# Patient Record
Sex: Male | Born: 1960 | Race: Black or African American | Hispanic: No | Marital: Married | Smoking: Never smoker
Health system: Southern US, Community
[De-identification: ages and names within clinical notes are randomized; demographics above are authoritative.]

## PROBLEM LIST (undated history)

## (undated) ENCOUNTER — Emergency Department (HOSPITAL_COMMUNITY): Discharge status: Absent without leave | Payer: Commercial Managed Care - PPO

## (undated) DIAGNOSIS — E119 Type 2 diabetes mellitus without complications: Secondary | ICD-10-CM

## (undated) DIAGNOSIS — I10 Essential (primary) hypertension: Secondary | ICD-10-CM

## (undated) DIAGNOSIS — E785 Hyperlipidemia, unspecified: Secondary | ICD-10-CM

## (undated) HISTORY — DX: Hyperlipidemia, unspecified: E78.5

## (undated) HISTORY — DX: Type 2 diabetes mellitus without complications: E11.9

## (undated) HISTORY — DX: Essential (primary) hypertension: I10

---

## 2015-05-07 ENCOUNTER — Other Ambulatory Visit: Payer: Self-pay | Admitting: Allergy and Immunology

## 2015-05-11 ENCOUNTER — Other Ambulatory Visit: Payer: Self-pay | Admitting: Allergy and Immunology

## 2016-03-23 LAB — HEMOGLOBIN A1C: Hemoglobin A1C: 11

## 2016-05-20 ENCOUNTER — Ambulatory Visit: Payer: Self-pay | Admitting: "Endocrinology

## 2016-05-23 ENCOUNTER — Ambulatory Visit (INDEPENDENT_AMBULATORY_CARE_PROVIDER_SITE_OTHER): Payer: BLUE CROSS/BLUE SHIELD | Admitting: "Endocrinology

## 2016-05-23 ENCOUNTER — Encounter: Payer: Self-pay | Admitting: "Endocrinology

## 2016-05-23 VITALS — BP 132/88 | HR 76 | Ht 76.0 in | Wt >= 6400 oz

## 2016-05-23 DIAGNOSIS — E7800 Pure hypercholesterolemia, unspecified: Secondary | ICD-10-CM | POA: Insufficient documentation

## 2016-05-23 DIAGNOSIS — E118 Type 2 diabetes mellitus with unspecified complications: Secondary | ICD-10-CM | POA: Diagnosis not present

## 2016-05-23 DIAGNOSIS — I10 Essential (primary) hypertension: Secondary | ICD-10-CM

## 2016-05-23 DIAGNOSIS — IMO0002 Reserved for concepts with insufficient information to code with codable children: Secondary | ICD-10-CM

## 2016-05-23 DIAGNOSIS — E1165 Type 2 diabetes mellitus with hyperglycemia: Secondary | ICD-10-CM

## 2016-05-23 MED ORDER — EMPAGLIFLOZIN 10 MG PO TABS: 10.0000 mg | ORAL_TABLET | Freq: Every day | ORAL | 3 refills | Status: DC

## 2016-05-23 NOTE — Progress Notes (Signed)
Subjective:    Patient ID: William [name], male    DOB: 05/18/1961. Patient is being seen in consultation for management of diabetes requested by  Ernestine Conrad, MD  Past Medical History:  Diagnosis Date  . Diabetes mellitus, type II (HCC)   . Hypertension    History reviewed. No pertinent surgical history. Social History   Social History  . Marital status: Married    Spouse name: N/A  . Number of children: N/A  . Years of education: N/A   Social History Main Topics  . Smoking status: Never Smoker  . Smokeless tobacco: Never Used  . Alcohol use No  . Drug use: No  . Sexual activity: Not Asked   Other Topics Concern  . None   Social History Narrative  . None   Outpatient Encounter Prescriptions as of 05/23/2016  Medication Sig  . hydrochlorothiazide (HYDRODIURIL) 25 MG tablet Take 25 mg by mouth daily.  . metFORMIN (GLUCOPHAGE-XR) 500 MG 24 hr tablet Take 500 mg by mouth 2 (two) times daily.  . rosuvastatin (CRESTOR) 20 MG tablet Take 20 mg by mouth daily.  . [DISCONTINUED] glimepiride (AMARYL) 2 MG tablet Take 2 mg by mouth daily with breakfast.  . empagliflozin (JARDIANCE) 10 MG TABS tablet Take 10 mg by mouth daily.  . [DISCONTINUED] montelukast (SINGULAIR) 10 MG tablet TAKE ONE TABLET BY MOUTH IN THE EVENING FOR COUGH OR WHEEZE   No facility-administered encounter medications on file as of 05/23/2016.    ALLERGIES: No Known Allergies VACCINATION STATUS:  There is no immunization history on file for this patient.  Diabetes  He presents for his initial diabetic visit. He has type 2 diabetes mellitus. Onset time: Patient was diagnosed at approximate age of 55 years. However, he was known to have prediabetes for several years with a reported A1c in the 8s %. His disease course has been worsening. There are no hypoglycemic associated symptoms. Pertinent negatives for hypoglycemia include no confusion, headaches, pallor or seizures. Associated symptoms include  blurred vision, polydipsia and polyuria. Pertinent negatives for diabetes include no chest pain, no fatigue, no polyphagia and no weakness. There are no hypoglycemic complications. Symptoms are worsening. There are no diabetic complications. Risk factors for coronary artery disease include diabetes mellitus, dyslipidemia, male sex and sedentary lifestyle. Current diabetic treatment includes oral agent (dual therapy) (He is on metformin 500 mg ER twice a day and glimepiride 2 mg once a day.). His weight is increasing steadily. He is following a generally unhealthy diet. When asked about meal planning, he reported none. He has not had a previous visit with a dietitian. He never participates in exercise. Home blood sugar record trend: He did not bring any meter nor logs to review today and he admits not to monitoring blood glucose regularly. An ACE inhibitor/angiotensin II receptor blocker is not being taken. Eye exam is not current.  Hyperlipidemia  This is a chronic problem. The current episode started more than 1 year ago. The problem is uncontrolled. Exacerbating diseases include diabetes and obesity. Pertinent negatives include no chest pain, myalgias or shortness of breath. Current antihyperlipidemic treatment includes statins. Risk factors for coronary artery disease include diabetes mellitus, dyslipidemia, male sex, obesity and a sedentary lifestyle.       Review of Systems  Constitutional: Negative for chills, fatigue, fever and unexpected weight change.  HENT: Negative for dental problem, mouth sores and trouble swallowing.   Eyes: Positive for blurred vision. Negative for visual disturbance.  Respiratory: Negative for  cough, choking, chest tightness, shortness of breath and wheezing.   Cardiovascular: Negative for chest pain, palpitations and leg swelling.  Gastrointestinal: Negative for abdominal distention, abdominal pain, constipation, diarrhea, nausea and vomiting.  Endocrine: Positive for  polydipsia and polyuria. Negative for polyphagia.  Genitourinary: Negative for dysuria, flank pain, hematuria and urgency.  Musculoskeletal: Negative for back pain, gait problem, myalgias and neck pain.  Skin: Negative for pallor, rash and wound.  Neurological: Negative for seizures, syncope, weakness, numbness and headaches.  Psychiatric/Behavioral: Negative.  Negative for confusion and dysphoric mood.    Objective:    BP 132/88   Pulse 76   Ht 6\' 4"  (1.93 m)   Wt (!) 481 lb (218.2 kg)   BMI 58.55 kg/m   Wt Readings from Last 3 Encounters:  05/23/16 (!) 481 lb (218.2 kg)    Physical Exam  Constitutional: He is oriented to person, place, and time. He appears well-developed. He is cooperative. No distress.  Morbidly obese young man.  HENT:  Head: Normocephalic and atraumatic.  Eyes: EOM are normal.  Neck: Normal range of motion. Neck supple. No tracheal deviation present. No thyromegaly present.  Cardiovascular: Normal rate, S1 normal, S2 normal and normal heart sounds.  Exam reveals no gallop.   No murmur heard. Pulses:      Dorsalis pedis pulses are 1+ on the right side, and 1+ on the left side.       Posterior tibial pulses are 1+ on the right side, and 1+ on the left side.  Pulmonary/Chest: Breath sounds normal. No respiratory distress. He has no wheezes.  Abdominal: Soft. Bowel sounds are normal. He exhibits no distension. There is no tenderness. There is no guarding and no CVA tenderness.  Musculoskeletal: He exhibits no edema.       Right shoulder: He exhibits no swelling and no deformity.  Neurological: He is alert and oriented to person, place, and time. He has normal strength and normal reflexes. No cranial nerve deficit or sensory deficit. Gait normal.  Skin: Skin is warm and dry. No rash noted. No cyanosis. Nails show no clubbing.  Psychiatric: He has a normal mood and affect. His speech is normal. Cognition and memory are normal.    During his August physical his  A1c was reported to be greater than 11%.     Assessment & Plan:   1. Uncontrolled type 2 diabetes mellitus with complication, without long-term current use of insulin (HCC)    - Patient has currently uncontrolled symptomatic type 2 DM since  55 years of age,  with most recent A1c of 11 %. Recent labs reviewed.   his diabetes is complicated by morbid obesity and sedentary lifestyle and patient remains at a high risk for more acute and chronic complications of diabetes which include CAD, CVA, CKD, retinopathy, and neuropathy. These are all discussed in detail with the patient.  - I have counseled the patient on diet management and weight loss, by adopting a carbohydrate restricted/protein rich diet.  - Suggestion is made for patient to avoid simple carbohydrates   from their diet including Cakes , Desserts, Ice Cream,  Soda (  diet and regular) , Sweet Tea , Candies,  Chips, Cookies, Artificial Sweeteners,   and "Sugar-free" Products . This will help patient to have stable blood glucose profile and potentially avoid unintended weight gain.  - I encouraged the patient to switch to  unprocessed or minimally processed complex starch and increased protein intake (animal or plant source), fruits, and  vegetables.  - Patient is advised to stick to a routine mealtimes to eat 3 meals  a day and avoid unnecessary snacks ( to snack only to correct hypoglycemia).  - The patient will be scheduled with Norm Salt, RDN, CDE for individualized DM education.  - I have approached patient with the following individualized plan to manage diabetes and patient agrees:   - Given his extremely high A1c of greater than 11% which is likely associated with severe glycemic burden, he will require insulin treatment. - However, it is essential to assure his commitment for proper monitoring of blood glucose for optimal use of insulin.  I  will proceed to initiate strict monitoring of glucose  AC and HS.  -Patient  is encouraged to call clinic for blood glucose levels less than 70 or above 300 mg /dl. - I will continue metformin 500 mg ER twice a day, therapeutically suitable for patient. - I will add low-dose Jardiance 10 mg by mouth every morning. Side effects and precautions discussed with him.  - I will discontinue glimepiride, risk outweighs benefit for this patient.  - Patient will be considered for incretin therapy as appropriate next visit. - Patient specific target  A1c;  LDL, HDL, Triglycerides, and  Waist Circumference were discussed in detail.  2) BP/HTN: Controlled. Continue current medications. 3) Lipids/HPL:  Control unknown,  continue statins. 4)  Weight/Diet: CDE Consult will be initiated , exercise, and detailed carbohydrates information provided.  5) Chronic Care/Health Maintenance:  -Patient is on  Statin medications and encouraged to continue to follow up with Ophthalmology, Podiatrist at least yearly or according to recommendations, and advised to   stay away from smoking. I have recommended yearly flu vaccine and pneumonia vaccination at least every 5 years; moderate intensity exercise for up to 150 minutes weekly; and  sleep for at least 7 hours a day.  - 60 minutes of time was spent on the care of this patient , 50% of which was applied for counseling on diabetes complications and their preventions.  - Patient to bring meter and  blood glucose logs during their next visit.   - I advised patient to maintain close follow up with Ernestine Conrad, MD for primary care needs.  Follow up plan: - Return in about 1 week (around 05/30/2016) for follow up with meter and logs- no labs.  Marquis Lunch, MD Phone: [phone]  Fax: [phone]   05/23/2016, 5:04 PM

## 2016-05-23 NOTE — Patient Instructions (Signed)

## 2016-05-31 ENCOUNTER — Ambulatory Visit: Payer: BLUE CROSS/BLUE SHIELD | Admitting: "Endocrinology

## 2019-03-22 ENCOUNTER — Emergency Department (HOSPITAL_COMMUNITY): Payer: Commercial Managed Care - PPO

## 2019-03-22 ENCOUNTER — Encounter (HOSPITAL_COMMUNITY): Payer: Self-pay

## 2019-03-22 ENCOUNTER — Other Ambulatory Visit: Payer: Self-pay

## 2019-03-22 ENCOUNTER — Emergency Department (HOSPITAL_COMMUNITY)
Admission: EM | Admit: 2019-03-22 | Discharge: 2019-03-22 | Disposition: A | Discharge status: Home / Self Care | Payer: Commercial Managed Care - PPO | Attending: Emergency Medicine | Admitting: Emergency Medicine

## 2019-03-22 DIAGNOSIS — Z7984 Long term (current) use of oral hypoglycemic drugs: Secondary | ICD-10-CM | POA: Diagnosis not present

## 2019-03-22 DIAGNOSIS — E119 Type 2 diabetes mellitus without complications: Secondary | ICD-10-CM | POA: Diagnosis not present

## 2019-03-22 DIAGNOSIS — R0989 Other specified symptoms and signs involving the circulatory and respiratory systems: Secondary | ICD-10-CM | POA: Diagnosis not present

## 2019-03-22 DIAGNOSIS — I10 Essential (primary) hypertension: Secondary | ICD-10-CM | POA: Diagnosis not present

## 2019-03-22 DIAGNOSIS — Z79899 Other long term (current) drug therapy: Secondary | ICD-10-CM | POA: Insufficient documentation

## 2019-03-22 NOTE — Discharge Instructions (Signed)
Your testing today shows a totally normal x-ray without any signs of blockages or masses or swelling or cancers.  I would recommend that you follow-up with the neurologist for a possible sleep study.  Seek medical exam in the emergency department for any severe or worsening symptoms.  Try to sleep on your side to avoid this feeling while you are sleeping

## 2019-03-22 NOTE — ED Triage Notes (Signed)
Pt woke up this morning and felt like his throat was closing. Has been getting progressively worse over the last few weeks. States it may be attributed to allergies.

## 2019-03-22 NOTE — ED Provider Notes (Signed)
[facility] EMERGENCY DEPARTMENT Provider Note   CSN: [id number] Arrival date & time: 03/22/19  9983    History   Chief Complaint Chief Complaint  Patient presents with  . Throat Closing    HPI William [name] is a 58 y.o. male.     HPI  58 year old male with a history of diabetes and hypertension, presents to the hospital with a complaint of feeling like his throat is closing off.  This is been going on for an undefined amount of time but seems to be between 1 and 2 weeks, possibly longer but seems to be getting worse.  He reports that he almost always notices this when he sleeps on his back, he does not notice it when he sleeps on his side.  His significant other is here with him and states that she noticed him snoring heavily when he lays on his back but not on his side.  He reports that he wakes up feeling severely anxious that he cannot breathe and feeling like his throat is closing off, he does not feel this when he is in the upright position, he does not have exertional symptoms and denies any chest pain coughing fever shortness of breath or even a sore throat.  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  Past Medical History:  Diagnosis Date  . Diabetes mellitus, type II ([place])   . Hypertension     Patient Active Problem List   Diagnosis Date Noted  . Uncontrolled type 2 diabetes mellitus with complication, without long-term current use of insulin ([place]) 05/23/2016  . Hypercholesteremia 05/23/2016  . Morbid obesity ([place]) 05/23/2016  . Essential hypertension, benign 05/23/2016    History reviewed. No pertinent surgical history.      Home Medications    Prior to Admission medications   Medication Sig Start Date End Date Taking? Authorizing Provider  empagliflozin (JARDIANCE) 10 MG TABS tablet Take 10 mg by mouth daily. 05/23/16   Cassandria Anger, MD  hydrochlorothiazide  (HYDRODIURIL) 25 MG tablet Take 25 mg by mouth daily.    [provider]  metFORMIN (GLUCOPHAGE-XR) 500 MG 24 hr tablet Take 500 mg by mouth 2 (two) times daily.    [provider]  rosuvastatin (CRESTOR) 20 MG tablet Take 20 mg by mouth daily.    [provider]    Family History Family History  Problem Relation Age of Onset  . Thyroid disease Mother   . Hypertension Mother   . Diabetes Father   . Hypertension Father   . Hypertension Sister   . Hypertension Brother     Social History Social History   Tobacco Use  . Smoking status: Never Smoker  . Smokeless tobacco: Never Used  Substance Use Topics  . Alcohol use: No  . Drug use: No     Allergies   Patient has no known allergies.   Review of Systems Review of Systems  All other systems reviewed and are negative.    Physical Exam Updated Vital Signs BP (!) 147/95 (BP Location: Right Arm)   Pulse 86   Temp 97.7 F (36.5 C) (Oral)   Resp 12   Ht 1.93 m (6\' 4" )   Wt (!) 190.5 kg   SpO2 98%   BMI 51.12 kg/m   Physical Exam Vitals signs and nursing note reviewed.  Constitutional:      General: He is not in  acute distress.    Appearance: He is well-developed.  HENT:     Head: Normocephalic and atraumatic.     Comments: The pharynx is open, tonsils are normal, uvula is midline and of normal size and appearance, dentition is normal, phonation is normal, no trismus or torticollis    Mouth/Throat:     Pharynx: No oropharyngeal exudate.  Eyes:     General: No scleral icterus.       Right eye: No discharge.        Left eye: No discharge.     Conjunctiva/sclera: Conjunctivae normal.     Pupils: Pupils are equal, round, and reactive to light.  Neck:     Musculoskeletal: Normal range of motion and neck supple.     Thyroid: No thyromegaly.     Vascular: No JVD.  Cardiovascular:     Rate and Rhythm: Normal rate and regular rhythm.     Heart sounds: Normal heart sounds. No murmur. No  friction rub. No gallop.   Pulmonary:     Effort: Pulmonary effort is normal. No respiratory distress.     Breath sounds: Normal breath sounds. No wheezing or rales.  Abdominal:     General: Bowel sounds are normal. There is no distension.     Palpations: Abdomen is soft. There is no mass.     Tenderness: There is no abdominal tenderness.  Musculoskeletal: Normal range of motion.        General: No tenderness.  Lymphadenopathy:     Cervical: No cervical adenopathy.  Skin:    General: Skin is warm and dry.     Findings: No erythema or rash.  Neurological:     Mental Status: He is alert.     Coordination: Coordination normal.  Psychiatric:        Behavior: Behavior normal.      ED Treatments / Results  Labs (all labs ordered are listed, but only abnormal results are displayed) Labs Reviewed - No data to display  EKG None  Radiology Dg Neck Soft Tissue  Result Date: 03/22/2019 CLINICAL DATA:  Throat swelling EXAM: NECK SOFT TISSUES - 1+ VIEW COMPARISON:  None. FINDINGS: There is no evidence of retropharyngeal soft tissue swelling or epiglottic enlargement. The cervical airway is unremarkable and no radio-opaque foreign body identified. IMPRESSION: Negative. Electronically Signed   By: Charlett NoseKevin  Dover M.D.   On: 03/22/2019 08:22    Procedures Procedures (including critical care time)  Medications Ordered in ED Medications - No data to display   Initial Impression / Assessment and Plan / ED Course  I have reviewed the triage vital signs and the nursing notes.  Pertinent labs & imaging results that were available during my care of the patient were reviewed by me and considered in my medical decision making (see chart for details).  Clinical Course as of Mar 22 831  Mon Mar 22, 2019  0830 I have personally looked at the x-ray of the soft tissues of the neck and find there to be no signs of masses or obstructions.  This appears to be a normal x-ray.  I will refer the patient  to neurology   [BM]    Clinical Course User Index [BM] Eber HongMiller, Taralee Marcus, MD       The patient has a supple neck without any objective findings of swelling or anatomical obstruction.  I suspect there is some degree of sleep apnea.  He may have some type of laryngeal mass though this seems less likely.  X-ray has been ordered.  The patient can follow-up in the outpatient setting likely for a sleep study, he is agreeable.  Final Clinical Impressions(s) / ED Diagnoses   Final diagnoses:  Globus pharyngeus    ED Discharge Orders    None       Eber HongMiller, Rasa Degrazia, MD 03/22/19 [phone]

## 2019-04-22 ENCOUNTER — Other Ambulatory Visit (HOSPITAL_BASED_OUTPATIENT_CLINIC_OR_DEPARTMENT_OTHER): Payer: Self-pay

## 2019-04-22 DIAGNOSIS — G2581 Restless legs syndrome: Secondary | ICD-10-CM

## 2019-04-22 DIAGNOSIS — G4733 Obstructive sleep apnea (adult) (pediatric): Secondary | ICD-10-CM

## 2019-04-26 ENCOUNTER — Other Ambulatory Visit (HOSPITAL_COMMUNITY)
Admission: RE | Admit: 2019-04-26 | Discharge: 2019-04-26 | Disposition: A | Discharge status: Home / Self Care | Payer: Commercial Managed Care - PPO | Source: Ambulatory Visit | Attending: Neurology | Admitting: Neurology

## 2019-04-26 DIAGNOSIS — Z20828 Contact with and (suspected) exposure to other viral communicable diseases: Secondary | ICD-10-CM | POA: Insufficient documentation

## 2019-04-26 DIAGNOSIS — Z01812 Encounter for preprocedural laboratory examination: Secondary | ICD-10-CM | POA: Insufficient documentation

## 2019-04-26 LAB — SARS CORONAVIRUS 2 (TAT 6-24 HRS): SARS Coronavirus 2: NEGATIVE

## 2019-04-28 ENCOUNTER — Ambulatory Visit: Payer: Commercial Managed Care - PPO | Attending: Neurology | Admitting: Neurology

## 2019-04-28 ENCOUNTER — Other Ambulatory Visit: Payer: Self-pay

## 2019-04-28 DIAGNOSIS — G2581 Restless legs syndrome: Secondary | ICD-10-CM

## 2019-04-28 DIAGNOSIS — Z79899 Other long term (current) drug therapy: Secondary | ICD-10-CM | POA: Diagnosis not present

## 2019-04-28 DIAGNOSIS — G4733 Obstructive sleep apnea (adult) (pediatric): Secondary | ICD-10-CM | POA: Insufficient documentation

## 2019-04-28 DIAGNOSIS — Z7984 Long term (current) use of oral hypoglycemic drugs: Secondary | ICD-10-CM | POA: Diagnosis not present

## 2019-05-01 NOTE — Procedures (Signed)
   Little [place]ning A. Merlene Laughter, MD     [email or web address]             NOCTURNAL POLYSOMNOGRAPHY   LOCATION: [facility]  Patient Name: William, [name] Date: 04/28/2019 Gender: Male D.O.B: 10-13-1960 Age (years): 22 Referring Provider: Barton Fanny NP Height (inches): 76 Interpreting Physician: Phillips Odor MD, ABSM Weight (lbs): 420 RPSGT: Peak, Robert BMI: 51 MRN: [id number] Neck Size: 17.00 CLINICAL INFORMATION The patient is referred for a split night study with BPAP.  MEDICATIONS Medications self-administered by patient taken the night of the study : N/A  Current Outpatient Medications:  .  empagliflozin (JARDIANCE) 10 MG TABS tablet, Take 10 mg by mouth daily., Disp: 30 tablet, Rfl: 3 .  hydrochlorothiazide (HYDRODIURIL) 25 MG tablet, Take 25 mg by mouth daily., Disp: , Rfl:  .  metFORMIN (GLUCOPHAGE-XR) 500 MG 24 hr tablet, Take 500 mg by mouth 2 (two) times daily., Disp: , Rfl:  .  rosuvastatin (CRESTOR) 20 MG tablet, Take 20 mg by mouth daily., Disp: , Rfl:    SLEEP STUDY TECHNIQUE As per the AASM Manual for the Scoring of Sleep and Associated Events v2.3 (April 2016) with a hypopnea requiring 4% desaturations.  The channels recorded and monitored were frontal, central and occipital EEG, electrooculogram (EOG), submentalis EMG (chin), nasal and oral airflow, thoracic and abdominal wall motion, anterior tibialis EMG, snore microphone, electrocardiogram, and pulse oximetry. Bi-level positive airway pressure (BiPAP) was initiated when the patient met split night criteria and was titrated according to treat sleep-disordered breathing.  RESPIRATORY PARAMETERS Diagnostic  Total AHI (/hr): 32.9 RDI (/hr): 35.9 OA Index (/hr): - CA Index (/hr): 0.0 REM AHI (/hr): 67.2 NREM AHI (/hr): 28.9 Supine AHI (/hr): N/A Non-supine AHI (/hr): 35.85 Min O2 Sat (%): 80.0 Mean O2 (%): 94.4 Time below 88% (min): 8.5     Titration The patient initially was  tried on CPAP but had difficulties tolerating this.  The patient was subsequently placed on bilevel pressures. Optimal IPAP Pressure (cm): 15 Optimal EPAP Pressure (cm): 11 AHI at Optimal Pressure (/hr): 0.0 Min O2 at Optimal Pressure (%): 96.0 Sleep % at Optimal (%): 43 Supine % at Optimal (%): 0      SLEEP ARCHITECTURE The study was initiated at 10:00:12 PM and terminated at 4:05:39 AM. The total recorded time was 365.4 minutes. EEG confirmed total sleep time was 162.5 minutes yielding a sleep efficiency of 44.5%%. Sleep onset after lights out was 8.3 minutes with a REM latency of 149.0 minutes. The patient spent 12.3%% of the night in stage N1 sleep, 76.0%% in stage N2 sleep, 0.0%% in stage N3 and 11.7% in REM. Wake after sleep onset (WASO) was 194.7 minutes. The Arousal Index was 11.8/hour.  LEG MOVEMENT DATA The total Periodic Limb Movements of Sleep (PLMS) were 0. The PLMS index was 0.0 .  CARDIAC DATA The 2 lead EKG demonstrated sinus rhythm. The mean heart rate was 100.0 beats per minute. Other EKG findings include: None.  IMPRESSIONS 1. Severe obstructive sleep apnea occurred during the diagnostic portion of the study (AHI = 32.9 /hour). The optimal BIPAP selected for this patient are ( 15 / cm of water)   Delano Metz, MD Diplomate, American Board of Sleep Medicine.  ELECTRONICALLY SIGNED ON:  05/01/2019, 10:42 PM [place] PH: [phone]   FX: [phone] [place]

## 2019-06-07 ENCOUNTER — Ambulatory Visit (INDEPENDENT_AMBULATORY_CARE_PROVIDER_SITE_OTHER): Payer: Commercial Managed Care - PPO | Admitting: Otolaryngology

## 2019-08-06 HISTORY — PX: LEG SURGERY: SHX1003

## 2020-12-16 IMAGING — DX NECK SOFT TISSUES - 1+ VIEW
3 series · 3 of 3 positions shown · non-contrast
Comparison: None.

CLINICAL DATA: Throat swelling

EXAM:
NECK SOFT TISSUES - 1+ VIEW

[neck lat (1 of 2)]
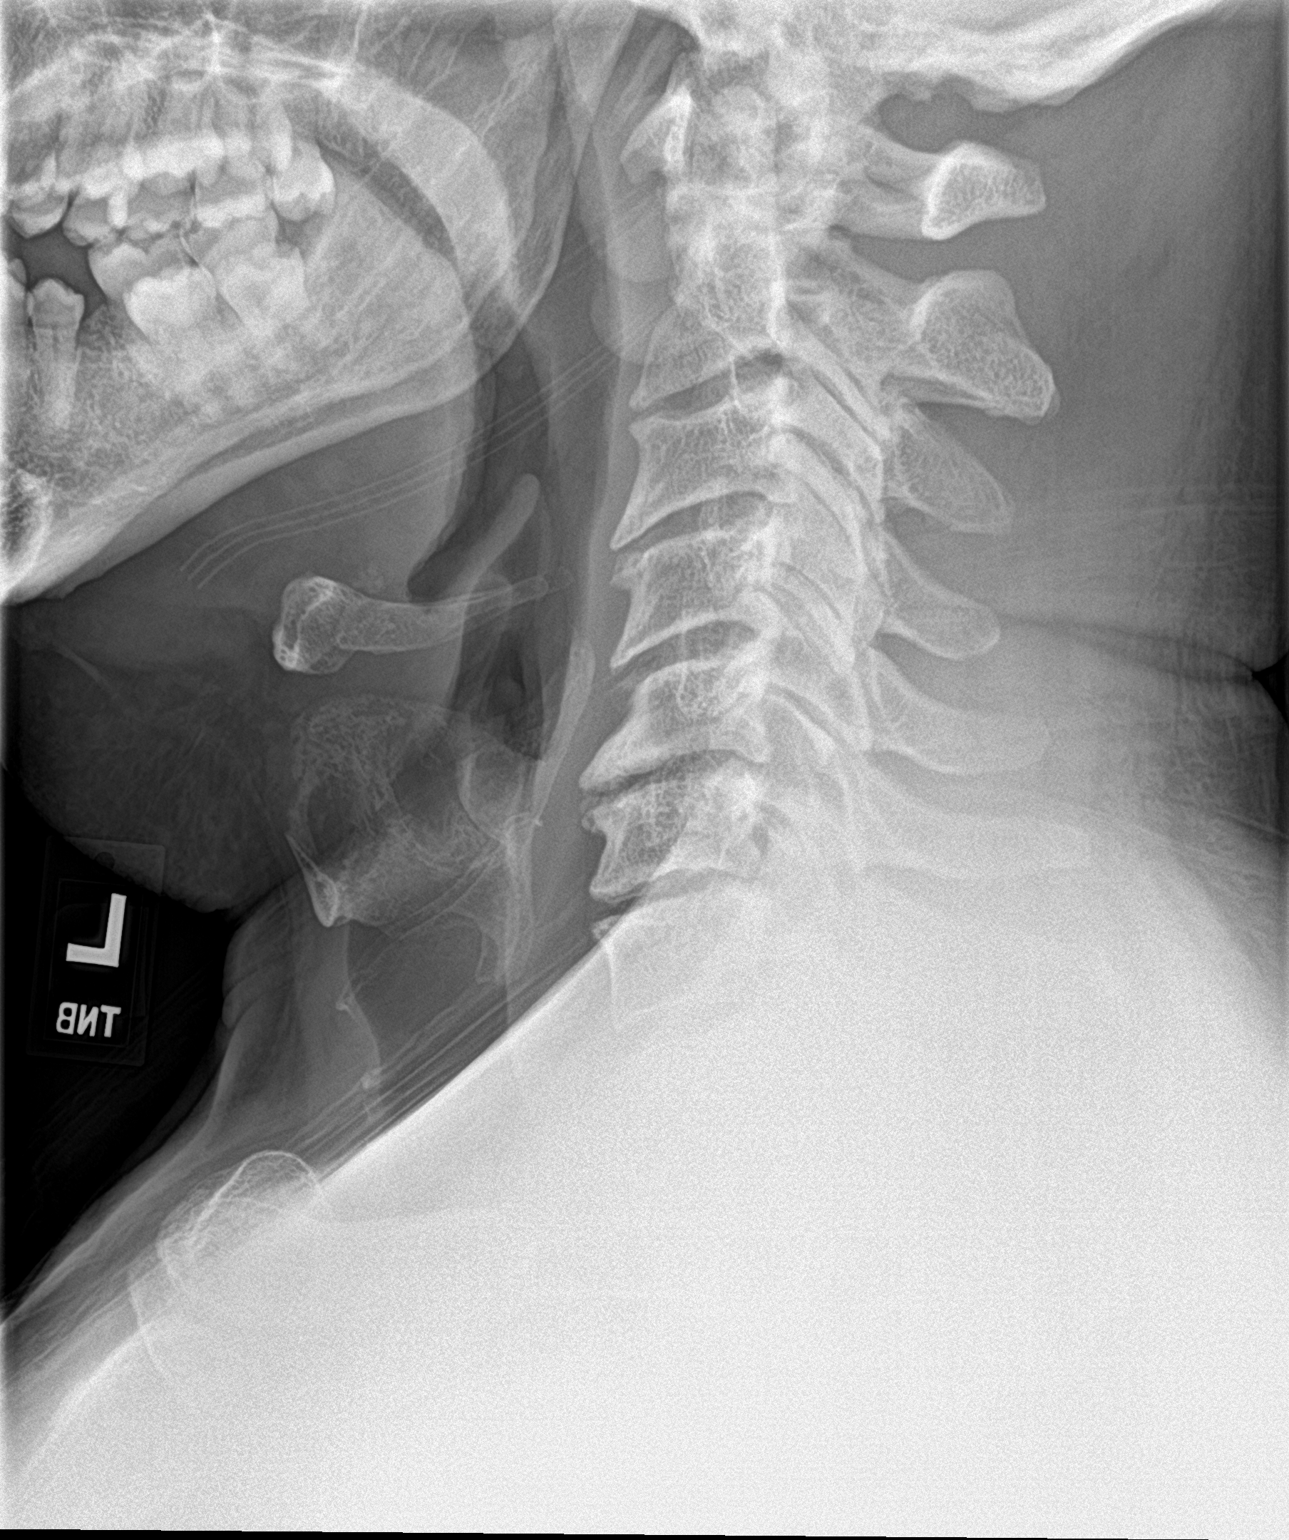

[neck ap]
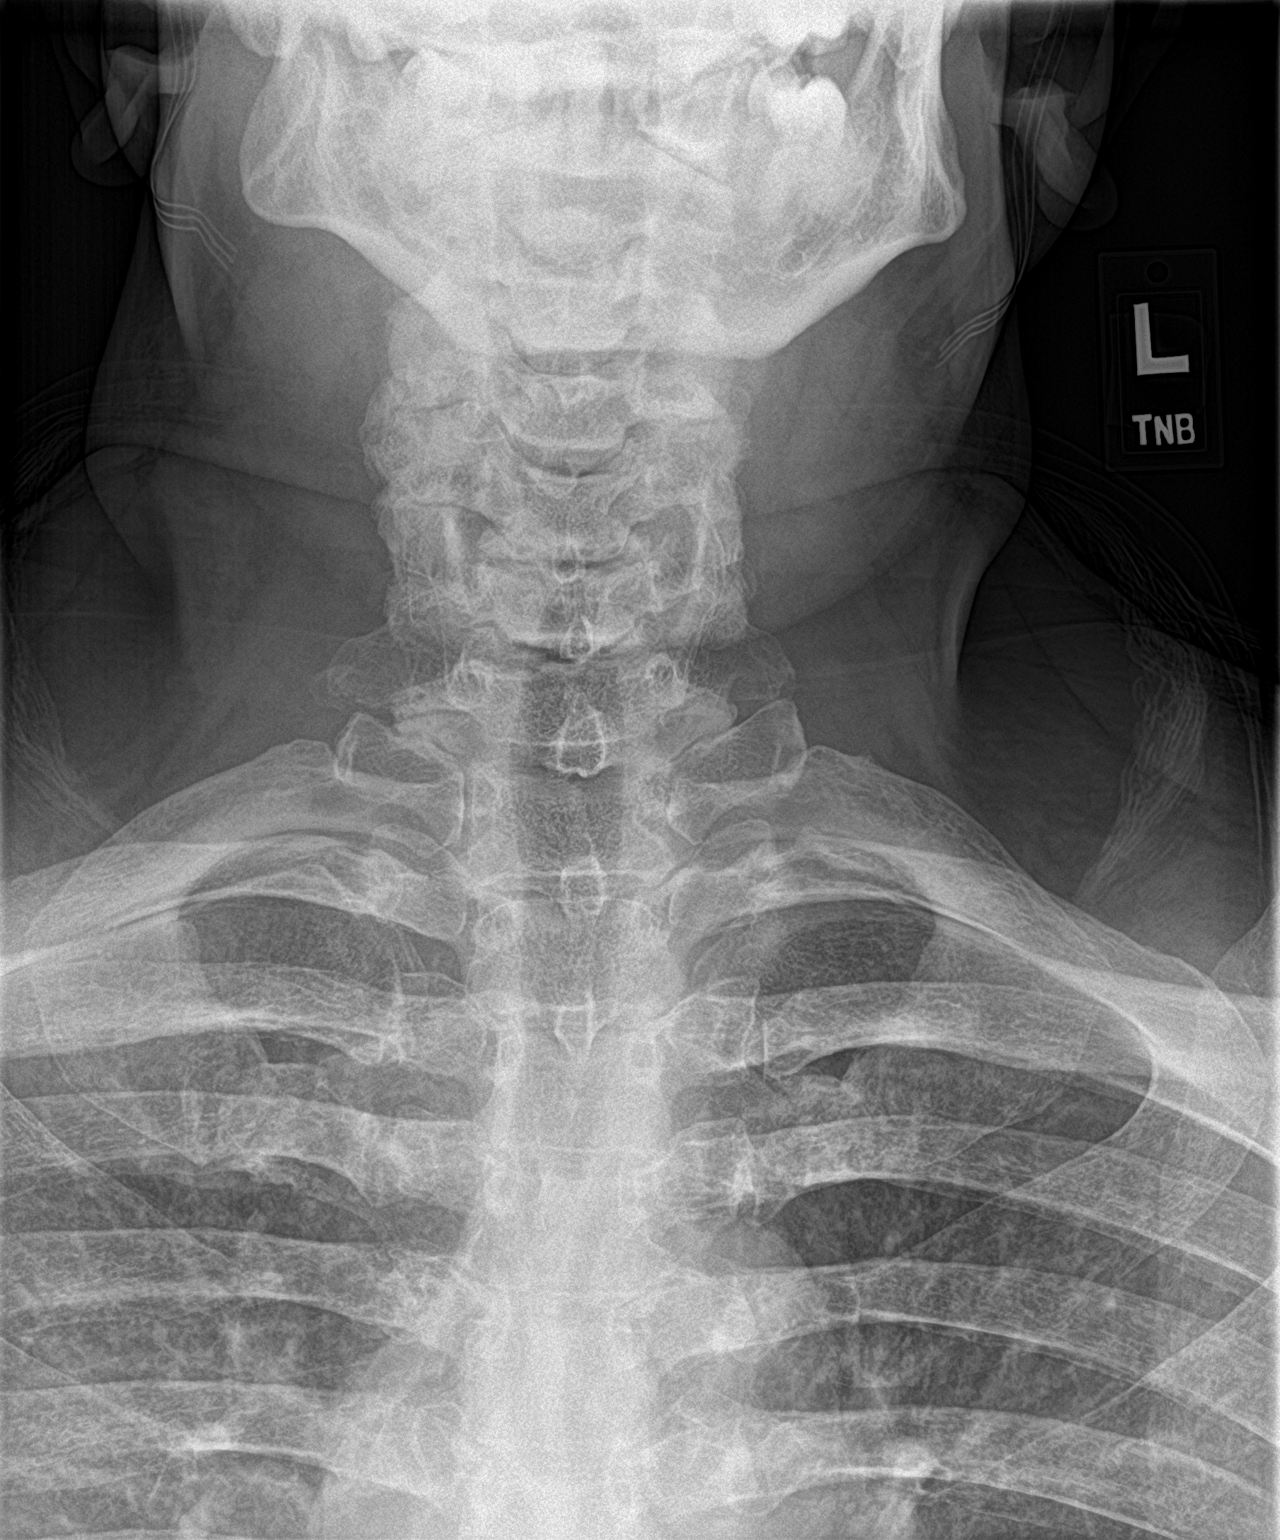

[neck lat (2 of 2)]
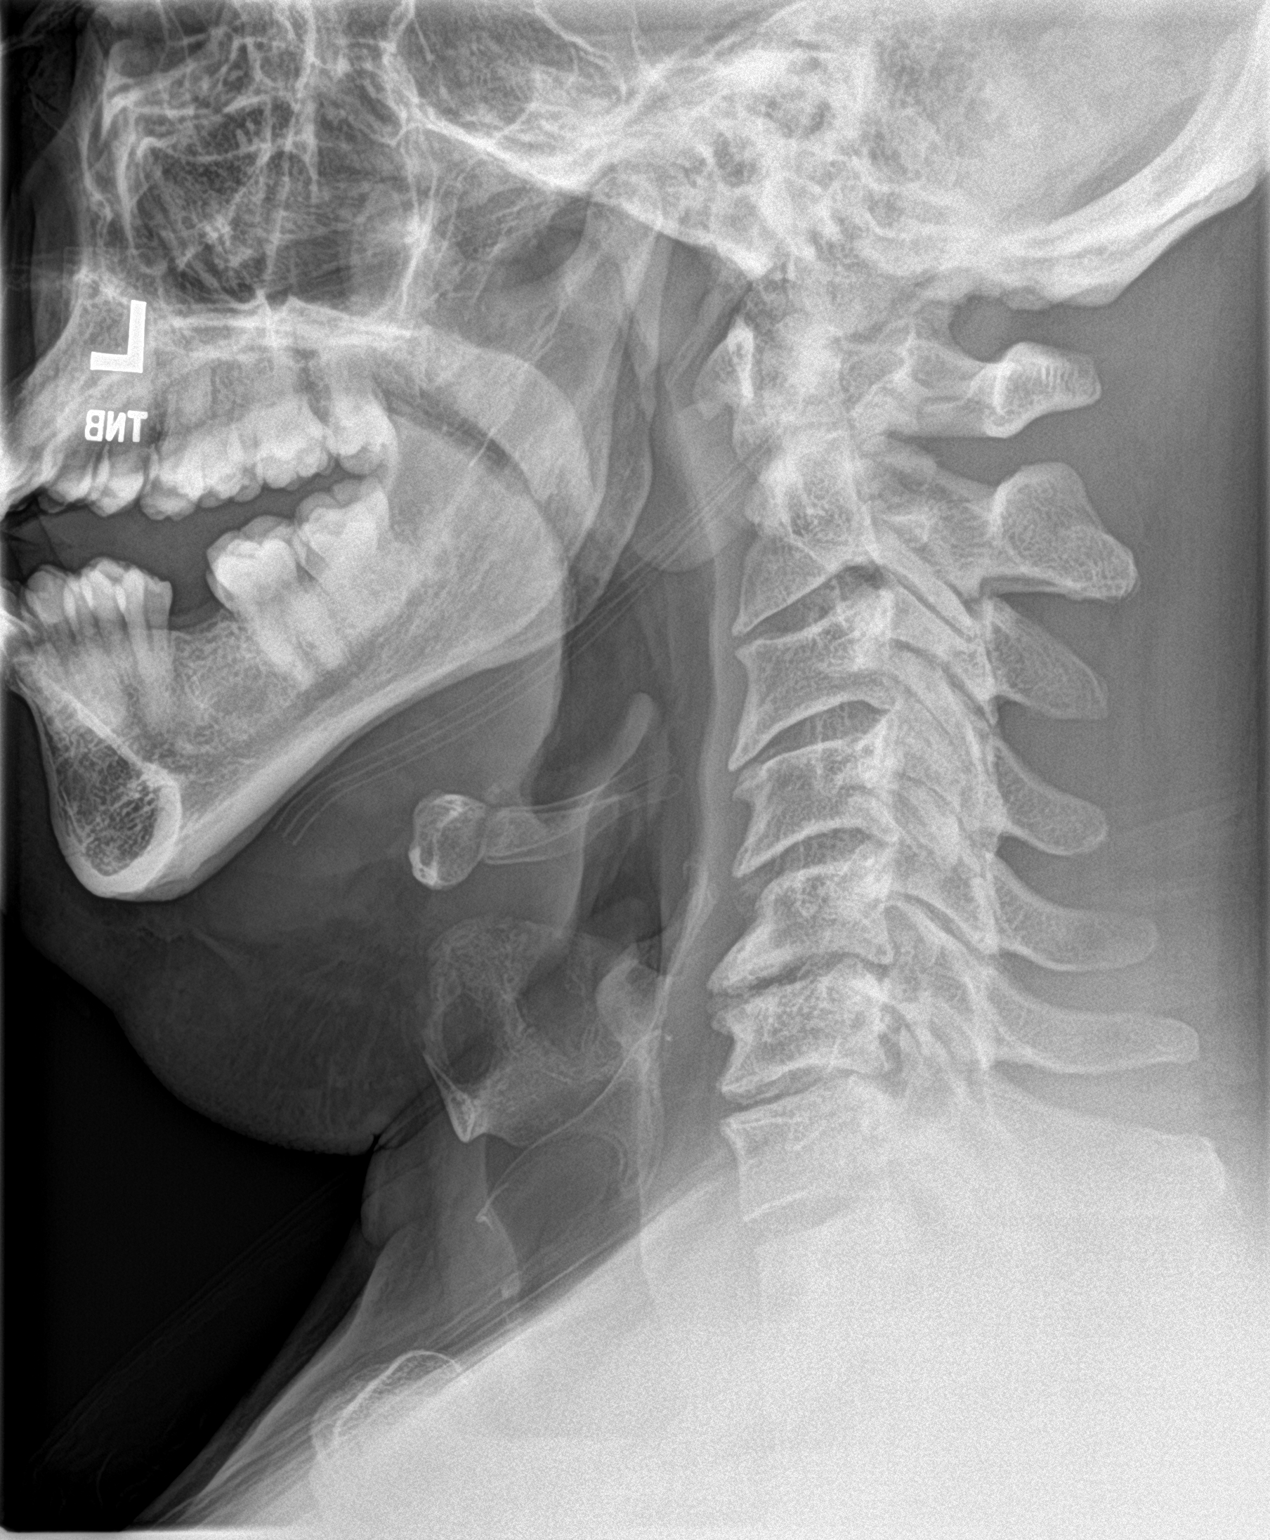

[3 of 3 positions shown; findings below may reference images not displayed]

FINDINGS: There is no evidence of retropharyngeal soft tissue swelling or
epiglottic enlargement. The cervical airway is unremarkable and no
radio-opaque foreign body identified.
IMPRESSION: Negative.

## 2021-07-20 ENCOUNTER — Encounter: Payer: Self-pay | Admitting: Family Medicine

## 2021-07-20 ENCOUNTER — Ambulatory Visit (INDEPENDENT_AMBULATORY_CARE_PROVIDER_SITE_OTHER): Payer: Commercial Managed Care - PPO | Admitting: Family Medicine

## 2021-07-20 DIAGNOSIS — Z7689 Persons encountering health services in other specified circumstances: Secondary | ICD-10-CM

## 2021-07-20 DIAGNOSIS — R030 Elevated blood-pressure reading, without diagnosis of hypertension: Secondary | ICD-10-CM

## 2021-07-20 DIAGNOSIS — E7800 Pure hypercholesterolemia, unspecified: Secondary | ICD-10-CM

## 2021-07-20 LAB — BAYER DCA HB A1C WAIVED: HB A1C (BAYER DCA - WAIVED): 7.2 % — ABNORMAL HIGH (ref 4.8–5.6)

## 2021-07-20 NOTE — Progress Notes (Signed)
New Patient Office Visit  Assessment & Plan:  1. Morbid obesity ([place]) - CBC with Differential/Platelet - CMP14+EGFR - Lipid panel - TSH - Bayer DCA Hb A1c Waived  2. Hypercholesteremia - Lipid panel  3. Elevated BP without diagnosis of hypertension Controlled at home per patient.  4. Encounter to establish care   Follow-up: Return as directed after labs result.   Hendricks Limes, MSN, APRN, FNP-C [facility]  Subjective:  Patient ID: William [name], male    DOB: [birth year]  Age: 60 y.o. MRN: [id number]  Patient Care Team: Loman Brooklyn, FNP as PCP - General (Family Medicine)  CC:  Chief Complaint  Patient presents with   New Patient (Initial Visit)    Dr.Bluth   Establish Care    HPI William [name] presents to establish care.   Patient is accompanied by his wife, who he is okay with being present.   Blood pressure is elevated today. Patient reports readings at home are around 128/80. He reports being 520 lbs before he had his leg surgery.    Review of Systems  Constitutional:  Negative for chills, fever, malaise/fatigue and weight loss.  HENT:  Negative for congestion, ear discharge, ear pain, nosebleeds, sinus pain, sore throat and tinnitus.   Eyes:  Negative for blurred vision, double vision, pain, discharge and redness.  Respiratory:  Negative for cough, shortness of breath and wheezing.   Cardiovascular:  Negative for chest pain, palpitations and leg swelling.  Gastrointestinal:  Negative for abdominal pain, constipation, diarrhea, heartburn, nausea and vomiting.  Genitourinary:  Negative for dysuria, frequency and urgency.  Musculoskeletal:  Negative for myalgias.  Skin:  Negative for rash.  Neurological:  Negative for dizziness, seizures, weakness and headaches.  Psychiatric/Behavioral:  Negative for depression, substance abuse and suicidal ideas. The patient is not nervous/anxious.   No current outpatient medications on  file.  No Known Allergies  Past Medical History:  Diagnosis Date   Diabetes mellitus, type II ([place])    Hypertension     Past Surgical History:  Procedure Laterality Date   LEG SURGERY Left 2021    Family History  Problem Relation Age of Onset   Thyroid disease Mother    Hypertension Mother    Diabetes Father    Hypertension Father    Hypertension Brother     Social History   Socioeconomic History   Marital status: Married    Spouse name: Not on file   Number of children: Not on file   Years of education: Not on file   Highest education level: Not on file  Occupational History   Not on file  Tobacco Use   Smoking status: Never   Smokeless tobacco: Never  Vaping Use   Vaping Use: Never used  Substance and Sexual Activity   Alcohol use: No   Drug use: No   Sexual activity: Not on file  Other Topics Concern   Not on file  Social History Narrative   Not on file   Social Determinants of Health   Financial Resource Strain: Not on file  Food Insecurity: Not on file  Transportation Needs: Not on file  Physical Activity: Not on file  Stress: Not on file  Social Connections: Not on file  Intimate Partner Violence: Not on file    Objective:   Today's Vitals: BP (!) 139/91    Pulse 84    Temp (!) 97.4 F (36.3 C) (Temporal)    Ht _0  (1.93 m)  Wt (!) 317 lb 6.4 oz (144 kg)    SpO2 98%    BMI 38.64 kg/m   Physical Exam Vitals reviewed.  Constitutional:      General: He is not in acute distress.    Appearance: Normal appearance. He is obese. He is not ill-appearing, toxic-appearing or diaphoretic.  HENT:     Head: Normocephalic and atraumatic.  Eyes:     General: No scleral icterus.       Right eye: No discharge.        Left eye: No discharge.     Conjunctiva/sclera: Conjunctivae normal.  Cardiovascular:     Rate and Rhythm: Normal rate and regular rhythm.     Heart sounds: Normal heart sounds. No murmur heard.   No friction rub. No gallop.   Pulmonary:     Effort: Pulmonary effort is normal. No respiratory distress.     Breath sounds: Normal breath sounds. No stridor. No wheezing, rhonchi or rales.  Musculoskeletal:        General: Normal range of motion.     Cervical back: Normal range of motion.  Skin:    General: Skin is warm and dry.  Neurological:     Mental Status: He is alert and oriented to person, place, and time. Mental status is at baseline.  Psychiatric:        Mood and Affect: Mood normal.        Behavior: Behavior normal.        Thought Content: Thought content normal.        Judgment: Judgment normal.

## 2021-07-21 LAB — CMP14+EGFR
ALT: 18 IU/L (ref 0–44)
AST: 18 IU/L (ref 0–40)
Albumin/Globulin Ratio: 2 (ref 1.2–2.2)
Albumin: 4.7 g/dL (ref 3.8–4.9)
Alkaline Phosphatase: 104 IU/L (ref 44–121)
BUN/Creatinine Ratio: 14 (ref 9–20)
BUN: 15 mg/dL (ref 6–24)
Bilirubin Total: 0.7 mg/dL (ref 0.0–1.2)
CO2: 21 mmol/L (ref 20–29)
Calcium: 9.7 mg/dL (ref 8.7–10.2)
Chloride: 104 mmol/L (ref 96–106)
Creatinine, Ser: 1.06 mg/dL (ref 0.76–1.27)
Globulin, Total: 2.3 g/dL (ref 1.5–4.5)
Glucose: 149 mg/dL — ABNORMAL HIGH (ref 70–99)
Potassium: 4 mmol/L (ref 3.5–5.2)
Sodium: 141 mmol/L (ref 134–144)
Total Protein: 7 g/dL (ref 6.0–8.5)
eGFR: 81 mL/min/{1.73_m2} (ref >59)

## 2021-07-21 LAB — LIPID PANEL
Chol/HDL Ratio: 4.4 ratio (ref 0.0–5.0)
Cholesterol, Total: 211 mg/dL — ABNORMAL HIGH (ref 100–199)
HDL: 48 mg/dL (ref >39)
LDL Chol Calc (NIH): 153 mg/dL — ABNORMAL HIGH (ref 0–99)
Triglycerides: 59 mg/dL (ref 0–149)
VLDL Cholesterol Cal: 10 mg/dL (ref 5–40)

## 2021-07-21 LAB — CBC WITH DIFFERENTIAL/PLATELET
Basophils Absolute: 0 10*3/uL (ref 0.0–0.2)
Basos: 1 %
EOS (ABSOLUTE): 0.1 10*3/uL (ref 0.0–0.4)
Eos: 1 %
Hematocrit: 47.9 % (ref 37.5–51.0)
Hemoglobin: 15.7 g/dL (ref 13.0–17.7)
Immature Grans (Abs): 0 10*3/uL (ref 0.0–0.1)
Immature Granulocytes: 0 %
Lymphocytes Absolute: 2.4 10*3/uL (ref 0.7–3.1)
Lymphs: 35 %
MCH: 28.4 pg (ref 26.6–33.0)
MCHC: 32.8 g/dL (ref 31.5–35.7)
MCV: 87 fL (ref 79–97)
Monocytes Absolute: 0.5 10*3/uL (ref 0.1–0.9)
Monocytes: 7 %
Neutrophils Absolute: 3.7 10*3/uL (ref 1.4–7.0)
Neutrophils: 56 %
Platelets: 236 10*3/uL (ref 150–450)
RBC: 5.53 x10E6/uL (ref 4.14–5.80)
RDW: 13.6 % (ref 11.6–15.4)
WBC: 6.7 10*3/uL (ref 3.4–10.8)

## 2021-07-21 LAB — TSH: TSH: 1.16 u[IU]/mL (ref 0.450–4.500)

## 2021-07-23 ENCOUNTER — Encounter: Payer: Self-pay | Admitting: Family Medicine

## 2021-07-23 ENCOUNTER — Other Ambulatory Visit: Payer: Self-pay | Admitting: Family Medicine

## 2021-07-23 DIAGNOSIS — E1165 Type 2 diabetes mellitus with hyperglycemia: Secondary | ICD-10-CM

## 2021-07-23 MED ORDER — RYBELSUS 7 MG PO TABS: 7.0000 mg | ORAL_TABLET | Freq: Every day | ORAL | 2 refills | Status: DC

## 2021-07-25 ENCOUNTER — Other Ambulatory Visit: Payer: Self-pay | Admitting: Family Medicine

## 2021-07-25 DIAGNOSIS — E7800 Pure hypercholesterolemia, unspecified: Secondary | ICD-10-CM

## 2021-07-25 DIAGNOSIS — E1165 Type 2 diabetes mellitus with hyperglycemia: Secondary | ICD-10-CM

## 2021-07-25 MED ORDER — ROSUVASTATIN CALCIUM 5 MG PO TABS
5.0000 mg | ORAL_TABLET | Freq: Every day | ORAL | 2 refills | Status: DC
Start: 2021-07-25 — End: 2021-12-13

## 2021-09-07 DIAGNOSIS — E11621 Type 2 diabetes mellitus with foot ulcer: Secondary | ICD-10-CM | POA: Insufficient documentation

## 2021-11-23 ENCOUNTER — Ambulatory Visit: Payer: Commercial Managed Care - PPO | Admitting: Family Medicine

## 2021-12-13 ENCOUNTER — Other Ambulatory Visit: Payer: Self-pay | Admitting: Family Medicine

## 2021-12-13 DIAGNOSIS — E7800 Pure hypercholesterolemia, unspecified: Secondary | ICD-10-CM

## 2021-12-13 DIAGNOSIS — E1165 Type 2 diabetes mellitus with hyperglycemia: Secondary | ICD-10-CM

## 2022-08-06 ENCOUNTER — Emergency Department (HOSPITAL_COMMUNITY): Payer: Commercial Managed Care - PPO

## 2022-08-06 ENCOUNTER — Other Ambulatory Visit: Payer: Self-pay

## 2022-08-06 ENCOUNTER — Encounter (HOSPITAL_COMMUNITY): Payer: Self-pay | Admitting: *Deleted

## 2022-08-06 ENCOUNTER — Emergency Department (HOSPITAL_COMMUNITY)
Admission: EM | Admit: 2022-08-06 | Discharge: 2022-08-06 | Disposition: A | Discharge status: Home / Self Care | Payer: Commercial Managed Care - PPO | Attending: Emergency Medicine | Admitting: Emergency Medicine

## 2022-08-06 DIAGNOSIS — M545 Low back pain, unspecified: Secondary | ICD-10-CM | POA: Diagnosis present

## 2022-08-06 DIAGNOSIS — I10 Essential (primary) hypertension: Secondary | ICD-10-CM | POA: Insufficient documentation

## 2022-08-06 DIAGNOSIS — N410 Acute prostatitis: Secondary | ICD-10-CM | POA: Insufficient documentation

## 2022-08-06 DIAGNOSIS — Z79899 Other long term (current) drug therapy: Secondary | ICD-10-CM | POA: Diagnosis not present

## 2022-08-06 DIAGNOSIS — N419 Inflammatory disease of prostate, unspecified: Secondary | ICD-10-CM

## 2022-08-06 DIAGNOSIS — E119 Type 2 diabetes mellitus without complications: Secondary | ICD-10-CM | POA: Insufficient documentation

## 2022-08-06 DIAGNOSIS — D72829 Elevated white blood cell count, unspecified: Secondary | ICD-10-CM | POA: Diagnosis not present

## 2022-08-06 DIAGNOSIS — Z7984 Long term (current) use of oral hypoglycemic drugs: Secondary | ICD-10-CM | POA: Insufficient documentation

## 2022-08-06 LAB — COMPREHENSIVE METABOLIC PANEL
ALT: 21 U/L (ref 0–44)
AST: 23 U/L (ref 15–41)
Albumin: 2.8 g/dL — ABNORMAL LOW (ref 3.5–5.0)
Alkaline Phosphatase: 81 U/L (ref 38–126)
Anion gap: 12 (ref 5–15)
BUN: 13 mg/dL (ref 6–20)
CO2: 25 mmol/L (ref 22–32)
Calcium: 8.3 mg/dL — ABNORMAL LOW (ref 8.9–10.3)
Chloride: 94 mmol/L — ABNORMAL LOW (ref 98–111)
Creatinine, Ser: 1.13 mg/dL (ref 0.61–1.24)
GFR, Estimated: >60 mL/min (ref >60)
Glucose, Bld: 350 mg/dL — ABNORMAL HIGH (ref 70–99)
Potassium: 4.1 mmol/L (ref 3.5–5.1)
Sodium: 131 mmol/L — ABNORMAL LOW (ref 135–145)
Total Bilirubin: 1.1 mg/dL (ref 0.3–1.2)
Total Protein: 7.3 g/dL (ref 6.5–8.1)

## 2022-08-06 LAB — URINALYSIS, ROUTINE W REFLEX MICROSCOPIC
Bacteria, UA: NONE SEEN
Bilirubin Urine: NEGATIVE
Glucose, UA: >=500 mg/dL — AB
Ketones, ur: 20 mg/dL — AB
Leukocytes,Ua: NEGATIVE
Nitrite: NEGATIVE
Protein, ur: 30 mg/dL — AB
Specific Gravity, Urine: 1.023 (ref 1.005–1.030)
pH: 6 (ref 5.0–8.0)

## 2022-08-06 LAB — CBC WITH DIFFERENTIAL/PLATELET
Abs Immature Granulocytes: 0.22 10*3/uL — ABNORMAL HIGH (ref 0.00–0.07)
Basophils Absolute: 0.1 10*3/uL (ref 0.0–0.1)
Basophils Relative: 0 %
Eosinophils Absolute: 0 10*3/uL (ref 0.0–0.5)
Eosinophils Relative: 0 %
HCT: 40.5 % (ref 39.0–52.0)
Hemoglobin: 13 g/dL (ref 13.0–17.0)
Immature Granulocytes: 1 %
Lymphocytes Relative: 6 %
Lymphs Abs: 1 10*3/uL (ref 0.7–4.0)
MCH: 26.9 pg (ref 26.0–34.0)
MCHC: 32.1 g/dL (ref 30.0–36.0)
MCV: 83.9 fL (ref 80.0–100.0)
Monocytes Absolute: 1.2 10*3/uL — ABNORMAL HIGH (ref 0.1–1.0)
Monocytes Relative: 7 %
Neutro Abs: 14.8 10*3/uL — ABNORMAL HIGH (ref 1.7–7.7)
Neutrophils Relative %: 86 %
Platelets: 316 10*3/uL (ref 150–400)
RBC: 4.83 MIL/uL (ref 4.22–5.81)
RDW: 13.9 % (ref 11.5–15.5)
WBC: 17.2 10*3/uL — ABNORMAL HIGH (ref 4.0–10.5)
nRBC: 0 % (ref 0.0–0.2)

## 2022-08-06 LAB — LIPASE, BLOOD: Lipase: 24 U/L (ref 11–51)

## 2022-08-06 MED ORDER — OXYCODONE-ACETAMINOPHEN 5-325 MG PO TABS: 1.0000 | ORAL_TABLET | Freq: Four times a day (QID) | ORAL | 0 refills | Status: DC | PRN

## 2022-08-06 MED ORDER — ONDANSETRON HCL 4 MG/2ML IJ SOLN
4.0000 mg | Freq: Once | INTRAMUSCULAR | Status: AC
  Administered 2022-08-06: 4 mg via INTRAVENOUS
  Filled 2022-08-06: qty 2

## 2022-08-06 MED ORDER — HYDROMORPHONE HCL 1 MG/ML IJ SOLN
1.0000 mg | Freq: Once | INTRAMUSCULAR | Status: AC
  Administered 2022-08-06: 1 mg via INTRAVENOUS
  Filled 2022-08-06: qty 1

## 2022-08-06 MED ORDER — OXYCODONE-ACETAMINOPHEN 5-325 MG PO TABS
1.0000 | ORAL_TABLET | Freq: Once | ORAL | Status: AC
  Administered 2022-08-06: 1 via ORAL
  Filled 2022-08-06: qty 1

## 2022-08-06 NOTE — ED Notes (Signed)
AVS with prescriptions provided to and discussed with patient. Pt verbalizes understanding of discharge instructions and denies any questions or concerns at this time. Pt has ride home. Pt ambulated out of department independently with steady gait.  

## 2022-08-06 NOTE — ED Triage Notes (Addendum)
Pt c/o left lower back pain that started Friday. Unknown injury. Pt reports he was seen by Urgent Care on Saturday and given a shot which he reports helped the pain, but it returned after the medication wore off. Pt also given muscle relaxer rx which he reports has helped "some".

## 2022-08-06 NOTE — ED Provider Notes (Signed)
  [facility] EMERGENCY DEPARTMENT Provider Note   CSN: [id number] Arrival date & time: 08/06/22  2694     History {Add pertinent medical, surgical, social history, OB history to HPI:1} Chief Complaint  Patient presents with   Back Pain    William [name] is a 62 y.o. male.   Back Pain      Home Medications Prior to Admission medications   Medication Sig Start Date End Date Taking? Authorizing Provider  rosuvastatin (CRESTOR) 5 MG tablet Take 1 tablet (5 mg total) by mouth daily. (NEEDS TO BE SEEN BEFORE NEXT REFILL) 12/13/21   Loman Brooklyn, FNP  Semaglutide (RYBELSUS) 7 MG TABS Take 1 tablet by mouth daily. (NEEDS TO BE SEEN BEFORE NEXT REFILL) 12/13/21   Loman Brooklyn, FNP      Allergies    Patient has no known allergies.    Review of Systems   Review of Systems  Musculoskeletal:  Positive for back pain.    Physical Exam Updated Vital Signs BP 128/79 (BP Location: Right Arm)   Pulse 100   Temp 98.3 F (36.8 C) (Oral)   Resp 18   Ht 6\' 4"  (1.93 m)   Wt (!) 174.2 kg   SpO2 98%   BMI 46.74 kg/m  Physical Exam  ED Results / Procedures / Treatments   Labs (all labs ordered are listed, but only abnormal results are displayed) Labs Reviewed - No data to display  EKG None  Radiology No results found.  Procedures Procedures  {Document cardiac monitor, telemetry assessment procedure when appropriate:1}  Medications Ordered in ED Medications - No data to display  ED Course/ Medical Decision Making/ A&P                           Medical Decision Making  ***  {Document critical care time when appropriate:1} {Document review of labs and clinical decision tools ie heart score, Chads2Vasc2 etc:1}  {Document your independent review of radiology images, and any outside records:1} {Document your discussion with family members, caretakers, and with consultants:1} {Document social determinants of health affecting pt's care:1} {Document your decision  making why or why not admission, treatments were needed:1} Final Clinical Impression(s) / ED Diagnoses Final diagnoses:  None    Rx / DC Orders ED Discharge Orders     None

## 2022-08-06 NOTE — Discharge Instructions (Signed)
As discussed, please continue your Flomax and antibiotic as directed until finished.  You may stop the ketorolac tablets if this medication is causing stomach upset.  Please call the urologist listed to arrange follow-up appointment.  You may also need to follow-up with your primary care provider regarding your back pain.  Return to emergency department for any new or worsening symptoms.

## 2022-08-06 NOTE — ED Notes (Signed)
Pt transported back to room from CT via W/C. 

## 2022-08-09 ENCOUNTER — Encounter: Payer: Self-pay | Admitting: Urology

## 2022-08-09 ENCOUNTER — Ambulatory Visit (INDEPENDENT_AMBULATORY_CARE_PROVIDER_SITE_OTHER): Payer: Commercial Managed Care - PPO | Admitting: Urology

## 2022-08-09 VITALS — BP 115/66 | HR 76 | Ht 76.0 in | Wt 389.0 lb

## 2022-08-09 DIAGNOSIS — N390 Urinary tract infection, site not specified: Secondary | ICD-10-CM | POA: Insufficient documentation

## 2022-08-09 DIAGNOSIS — N410 Acute prostatitis: Secondary | ICD-10-CM | POA: Diagnosis not present

## 2022-08-09 MED ORDER — OXYCODONE HCL 5 MG PO TABS: 5.0000 mg | ORAL_TABLET | Freq: Four times a day (QID) | ORAL | 0 refills | Status: DC | PRN

## 2022-08-09 MED ORDER — TAMSULOSIN HCL 0.4 MG PO CAPS: 0.4000 mg | ORAL_CAPSULE | Freq: Every day | ORAL | 1 refills | Status: DC

## 2022-08-09 MED ORDER — MELOXICAM 7.5 MG PO TABS: 7.5000 mg | ORAL_TABLET | Freq: Every day | ORAL | 1 refills | Status: DC

## 2022-08-09 MED ORDER — CIPROFLOXACIN HCL 500 MG PO TABS: 500.0000 mg | ORAL_TABLET | Freq: Two times a day (BID) | ORAL | 0 refills | Status: DC

## 2022-08-09 NOTE — Progress Notes (Signed)
Assessment: 1. Acute prostatitis   2. Urinary tract infection without hematuria, site unspecified    Plan: I personally reviewed the patient's chart including provider notes, lab results, and imaging results. I personally reviewed the CT study from 08/06/2022 with results as noted below. Diagnosis and management of prostatitis discussed with the patient. Recommend continuing Cipro 500 mg twice daily for a total of 1 month. Continue tamsulosin 0.4 mg daily. Discontinue Toradol. Begin meloxicam 7.5 mg daily. Prescription for oxycodone 5 mg every 6 hours as needed provided. Return to office in 3-4 weeks for reevaluation.  Chief Complaint:  Chief Complaint  Patient presents with   Prostatitis    History of Present Illness:  William [name] is a 62 y.o. male who is seen for evaluation of low back pain and dysuria.  He was seen by urgent care on 07/31/2022 with dysuria difficulty voiding, and left sided back/flank pain.  No fever or chills.  Urine culture at that time grew >100 K Staphylococcus, sensitive to Cipro.  He was started on ciprofloxacin x 14 days, Flomax, and Toradol.  He presented to the emergency room on 08/06/2022 with continued left low back pain.  Urinalysis was unremarkable.  White blood cell count elevated at 17.2 K.  CT imaging showed no renal or ureteral calculi, no obstruction, enlarged prostate which appeared hypodense. He has continued on the Cipro, Flomax, and Toradol.  He has noted some improvement in his symptoms.  He is having some nausea associated with the Toradol.  He continues to have pain in the left back and flank area, pain in the rectal area, and lower urinary tract symptoms including frequency, urgency, decreased stream, and sensation of incomplete emptying.  No gross hematuria. IPSS = 35.   Past Medical History:  Past Medical History:  Diagnosis Date   Diabetes mellitus, type II ([place])    Hyperlipidemia    Hypertension     Past Surgical History:   Past Surgical History:  Procedure Laterality Date   LEG SURGERY Right 2021    Allergies:  No Known Allergies  Family History:  Family History  Problem Relation Age of Onset   Thyroid disease Mother    Hypertension Mother    Diabetes Father    Hypertension Father    Hypertension Brother     Social History:  Social History   Tobacco Use   Smoking status: Never   Smokeless tobacco: Never  Vaping Use   Vaping Use: Never used  Substance Use Topics   Alcohol use: No   Drug use: No    Review of symptoms:  Constitutional:  Negative for unexplained weight loss, night sweats, fever, chills ENT:  Negative for nose bleeds, sinus pain, painful swallowing CV:  Negative for chest pain, shortness of breath, exercise intolerance, palpitations, loss of consciousness Resp:  Negative for cough, wheezing, shortness of breath GI:  Negative for  vomiting, diarrhea, bloody stools GU:  Positives noted in HPI; otherwise negative for gross hematuria, urinary incontinence Neuro:  Negative for seizures, poor balance, limb weakness, slurred speech Psych:  Negative for lack of energy, depression, anxiety Endocrine:  Negative for polydipsia, polyuria, symptoms of hypoglycemia (dizziness, hunger, sweating) Hematologic:  Negative for anemia, purpura, petechia, prolonged or excessive bleeding, use of anticoagulants  Allergic:  Negative for difficulty breathing or choking as a result of exposure to anything; no shellfish allergy; no allergic response (rash/itch) to materials, foods  Physical exam: BP 115/66   Pulse 76   Ht 6\' 4"  (1.93 m)  Wt (!) 389 lb (176.4 kg)   BMI 47.35 kg/m  GENERAL APPEARANCE:  Well appearing, well developed, well nourished, NAD HEENT: Atraumatic, Normocephalic, oropharynx clear. NECK: Supple without lymphadenopathy or thyromegaly. LUNGS: Clear to auscultation bilaterally. HEART: Regular Rate and Rhythm without murmurs, gallops, or rubs. ABDOMEN: Soft, non-tender, No  Masses. EXTREMITIES: Moves all extremities well.  Without clubbing, cyanosis, or edema. NEUROLOGIC:  Alert and oriented x 3, in wheelchair, CN II-XII grossly intact.  MENTAL STATUS:  Appropriate. BACK:  Non-tender to palpation.  No CVAT SKIN:  Warm, dry and intact.   GU: Prostate: 40 g, tender Rectum: Normal tone,  no masses or tenderness   Results: None

## 2022-08-12 DIAGNOSIS — D72829 Elevated white blood cell count, unspecified: Secondary | ICD-10-CM | POA: Insufficient documentation

## 2022-08-12 DIAGNOSIS — N179 Acute kidney failure, unspecified: Secondary | ICD-10-CM | POA: Insufficient documentation

## 2022-08-12 DIAGNOSIS — A419 Sepsis, unspecified organism: Secondary | ICD-10-CM | POA: Insufficient documentation

## 2022-08-12 DIAGNOSIS — I480 Paroxysmal atrial fibrillation: Secondary | ICD-10-CM | POA: Diagnosis present

## 2022-08-12 DIAGNOSIS — G4733 Obstructive sleep apnea (adult) (pediatric): Secondary | ICD-10-CM | POA: Diagnosis present

## 2022-08-12 DIAGNOSIS — E1110 Type 2 diabetes mellitus with ketoacidosis without coma: Secondary | ICD-10-CM | POA: Insufficient documentation

## 2022-08-12 DIAGNOSIS — M869 Osteomyelitis, unspecified: Secondary | ICD-10-CM | POA: Insufficient documentation

## 2022-08-12 DIAGNOSIS — E860 Dehydration: Secondary | ICD-10-CM | POA: Insufficient documentation

## 2022-08-12 DIAGNOSIS — I4891 Unspecified atrial fibrillation: Secondary | ICD-10-CM | POA: Diagnosis present

## 2022-08-16 ENCOUNTER — Inpatient Hospital Stay (HOSPITAL_COMMUNITY)
Admission: AD | Admit: 2022-08-16 | Discharge: 2022-09-06 | DRG: 713 | Disposition: A | Discharge status: Skilled Nursing Facility | Payer: Commercial Managed Care - PPO | Source: Other Acute Inpatient Hospital | Attending: Student | Admitting: Student

## 2022-08-16 ENCOUNTER — Encounter (HOSPITAL_COMMUNITY): Payer: Self-pay

## 2022-08-16 DIAGNOSIS — L89312 Pressure ulcer of right buttock, stage 2: Secondary | ICD-10-CM | POA: Diagnosis present

## 2022-08-16 DIAGNOSIS — E039 Hypothyroidism, unspecified: Secondary | ICD-10-CM | POA: Diagnosis present

## 2022-08-16 DIAGNOSIS — E11621 Type 2 diabetes mellitus with foot ulcer: Secondary | ICD-10-CM | POA: Diagnosis present

## 2022-08-16 DIAGNOSIS — E1165 Type 2 diabetes mellitus with hyperglycemia: Secondary | ICD-10-CM | POA: Diagnosis present

## 2022-08-16 DIAGNOSIS — N410 Acute prostatitis: Secondary | ICD-10-CM

## 2022-08-16 DIAGNOSIS — L89322 Pressure ulcer of left buttock, stage 2: Secondary | ICD-10-CM | POA: Diagnosis present

## 2022-08-16 DIAGNOSIS — G4733 Obstructive sleep apnea (adult) (pediatric): Secondary | ICD-10-CM | POA: Diagnosis present

## 2022-08-16 DIAGNOSIS — K6812 Psoas muscle abscess: Secondary | ICD-10-CM | POA: Diagnosis present

## 2022-08-16 DIAGNOSIS — E0590 Thyrotoxicosis, unspecified without thyrotoxic crisis or storm: Secondary | ICD-10-CM

## 2022-08-16 DIAGNOSIS — B9561 Methicillin susceptible Staphylococcus aureus infection as the cause of diseases classified elsewhere: Secondary | ICD-10-CM | POA: Diagnosis not present

## 2022-08-16 DIAGNOSIS — Z833 Family history of diabetes mellitus: Secondary | ICD-10-CM

## 2022-08-16 DIAGNOSIS — E43 Unspecified severe protein-calorie malnutrition: Secondary | ICD-10-CM | POA: Diagnosis present

## 2022-08-16 DIAGNOSIS — M869 Osteomyelitis, unspecified: Secondary | ICD-10-CM | POA: Diagnosis present

## 2022-08-16 DIAGNOSIS — D649 Anemia, unspecified: Secondary | ICD-10-CM | POA: Diagnosis present

## 2022-08-16 DIAGNOSIS — R7881 Bacteremia: Secondary | ICD-10-CM | POA: Diagnosis present

## 2022-08-16 DIAGNOSIS — I480 Paroxysmal atrial fibrillation: Secondary | ICD-10-CM | POA: Diagnosis present

## 2022-08-16 DIAGNOSIS — N390 Urinary tract infection, site not specified: Secondary | ICD-10-CM | POA: Diagnosis present

## 2022-08-16 DIAGNOSIS — E119 Type 2 diabetes mellitus without complications: Secondary | ICD-10-CM

## 2022-08-16 DIAGNOSIS — N412 Abscess of prostate: Principal | ICD-10-CM | POA: Diagnosis present

## 2022-08-16 DIAGNOSIS — L89892 Pressure ulcer of other site, stage 2: Secondary | ICD-10-CM | POA: Diagnosis present

## 2022-08-16 DIAGNOSIS — I119 Hypertensive heart disease without heart failure: Secondary | ICD-10-CM | POA: Diagnosis not present

## 2022-08-16 DIAGNOSIS — E440 Moderate protein-calorie malnutrition: Secondary | ICD-10-CM | POA: Diagnosis present

## 2022-08-16 DIAGNOSIS — L97429 Non-pressure chronic ulcer of left heel and midfoot with unspecified severity: Secondary | ICD-10-CM | POA: Diagnosis present

## 2022-08-16 DIAGNOSIS — I10 Essential (primary) hypertension: Secondary | ICD-10-CM | POA: Diagnosis present

## 2022-08-16 DIAGNOSIS — Z79899 Other long term (current) drug therapy: Secondary | ICD-10-CM

## 2022-08-16 DIAGNOSIS — Z8349 Family history of other endocrine, nutritional and metabolic diseases: Secondary | ICD-10-CM

## 2022-08-16 DIAGNOSIS — E7800 Pure hypercholesterolemia, unspecified: Secondary | ICD-10-CM | POA: Diagnosis present

## 2022-08-16 DIAGNOSIS — Z6841 Body Mass Index (BMI) 40.0 and over, adult: Secondary | ICD-10-CM

## 2022-08-16 DIAGNOSIS — E871 Hypo-osmolality and hyponatremia: Secondary | ICD-10-CM | POA: Diagnosis not present

## 2022-08-16 DIAGNOSIS — E876 Hypokalemia: Secondary | ICD-10-CM | POA: Diagnosis present

## 2022-08-16 DIAGNOSIS — K5900 Constipation, unspecified: Secondary | ICD-10-CM | POA: Diagnosis present

## 2022-08-16 DIAGNOSIS — M86072 Acute hematogenous osteomyelitis, left ankle and foot: Secondary | ICD-10-CM | POA: Diagnosis not present

## 2022-08-16 DIAGNOSIS — I4891 Unspecified atrial fibrillation: Secondary | ICD-10-CM | POA: Diagnosis present

## 2022-08-16 DIAGNOSIS — Z794 Long term (current) use of insulin: Secondary | ICD-10-CM | POA: Diagnosis not present

## 2022-08-16 DIAGNOSIS — Z8249 Family history of ischemic heart disease and other diseases of the circulatory system: Secondary | ICD-10-CM

## 2022-08-17 ENCOUNTER — Other Ambulatory Visit: Payer: Self-pay

## 2022-08-17 ENCOUNTER — Inpatient Hospital Stay (HOSPITAL_COMMUNITY): Payer: Commercial Managed Care - PPO

## 2022-08-17 ENCOUNTER — Encounter (HOSPITAL_COMMUNITY): Payer: Self-pay | Admitting: Internal Medicine

## 2022-08-17 DIAGNOSIS — N412 Abscess of prostate: Principal | ICD-10-CM

## 2022-08-17 DIAGNOSIS — E119 Type 2 diabetes mellitus without complications: Secondary | ICD-10-CM

## 2022-08-17 DIAGNOSIS — K5900 Constipation, unspecified: Secondary | ICD-10-CM | POA: Insufficient documentation

## 2022-08-17 DIAGNOSIS — E43 Unspecified severe protein-calorie malnutrition: Secondary | ICD-10-CM | POA: Diagnosis present

## 2022-08-17 DIAGNOSIS — D649 Anemia, unspecified: Secondary | ICD-10-CM | POA: Diagnosis present

## 2022-08-17 DIAGNOSIS — K6812 Psoas muscle abscess: Secondary | ICD-10-CM | POA: Diagnosis present

## 2022-08-17 LAB — CBC WITH DIFFERENTIAL/PLATELET
Abs Immature Granulocytes: 0.92 10*3/uL — ABNORMAL HIGH (ref 0.00–0.07)
Basophils Absolute: 0.1 10*3/uL (ref 0.0–0.1)
Basophils Relative: 0 %
Eosinophils Absolute: 0.1 10*3/uL (ref 0.0–0.5)
Eosinophils Relative: 1 %
HCT: 34.1 % — ABNORMAL LOW (ref 39.0–52.0)
Hemoglobin: 10.8 g/dL — ABNORMAL LOW (ref 13.0–17.0)
Immature Granulocytes: 5 %
Lymphocytes Relative: 10 %
Lymphs Abs: 1.8 10*3/uL (ref 0.7–4.0)
MCH: 26.9 pg (ref 26.0–34.0)
MCHC: 31.7 g/dL (ref 30.0–36.0)
MCV: 85 fL (ref 80.0–100.0)
Monocytes Absolute: 1.6 10*3/uL — ABNORMAL HIGH (ref 0.1–1.0)
Monocytes Relative: 9 %
Neutro Abs: 14 10*3/uL — ABNORMAL HIGH (ref 1.7–7.7)
Neutrophils Relative %: 75 %
Platelets: 196 10*3/uL (ref 150–400)
RBC: 4.01 MIL/uL — ABNORMAL LOW (ref 4.22–5.81)
RDW: 14.9 % (ref 11.5–15.5)
WBC: 18.5 10*3/uL — ABNORMAL HIGH (ref 4.0–10.5)
nRBC: 0 % (ref 0.0–0.2)

## 2022-08-17 LAB — COMPREHENSIVE METABOLIC PANEL
ALT: 30 U/L (ref 0–44)
AST: 41 U/L (ref 15–41)
Albumin: 1.6 g/dL — ABNORMAL LOW (ref 3.5–5.0)
Alkaline Phosphatase: 121 U/L (ref 38–126)
Anion gap: 6 (ref 5–15)
BUN: 15 mg/dL (ref 6–20)
CO2: 26 mmol/L (ref 22–32)
Calcium: 7.4 mg/dL — ABNORMAL LOW (ref 8.9–10.3)
Chloride: 104 mmol/L (ref 98–111)
Creatinine, Ser: 0.95 mg/dL (ref 0.61–1.24)
GFR, Estimated: >60 mL/min (ref >60)
Glucose, Bld: 99 mg/dL (ref 70–99)
Potassium: 3.1 mmol/L — ABNORMAL LOW (ref 3.5–5.1)
Sodium: 136 mmol/L (ref 135–145)
Total Bilirubin: 0.5 mg/dL (ref 0.3–1.2)
Total Protein: 5.2 g/dL — ABNORMAL LOW (ref 6.5–8.1)

## 2022-08-17 LAB — GLUCOSE, CAPILLARY
Glucose-Capillary: 101 mg/dL — ABNORMAL HIGH (ref 70–99)
Glucose-Capillary: 69 mg/dL — ABNORMAL LOW (ref 70–99)
Glucose-Capillary: 107 mg/dL — ABNORMAL HIGH (ref 70–99)
Glucose-Capillary: 198 mg/dL — ABNORMAL HIGH (ref 70–99)
Glucose-Capillary: 121 mg/dL — ABNORMAL HIGH (ref 70–99)

## 2022-08-17 LAB — PROTIME-INR
INR: 1.4 — ABNORMAL HIGH (ref 0.8–1.2)
Prothrombin Time: 16.5 seconds — ABNORMAL HIGH (ref 11.4–15.2)

## 2022-08-17 LAB — MAGNESIUM: Magnesium: 2.1 mg/dL (ref 1.7–2.4)

## 2022-08-17 LAB — HEMOGLOBIN A1C
Hgb A1c MFr Bld: 12.9 % — ABNORMAL HIGH (ref 4.8–5.6)
Mean Plasma Glucose: 323.53 mg/dL

## 2022-08-17 MED ORDER — SODIUM CHLORIDE 0.9 % IV SOLN
2.0000 g | INTRAVENOUS | Status: DC
  Administered 2022-08-18: 2 g via INTRAVENOUS
  Filled 2022-08-17: qty 20

## 2022-08-17 MED ORDER — CHLORHEXIDINE GLUCONATE CLOTH 2 % EX PADS
6.0000 | MEDICATED_PAD | Freq: Every day | CUTANEOUS | Status: DC
  Administered 2022-08-17 – 2022-09-06 (×21): 6 via TOPICAL

## 2022-08-17 MED ORDER — VANCOMYCIN HCL 2000 MG/400ML IV SOLN
2000.0000 mg | Freq: Once | INTRAVENOUS | Status: AC
  Administered 2022-08-17: 2000 mg via INTRAVENOUS
  Filled 2022-08-17: qty 400

## 2022-08-17 MED ORDER — VANCOMYCIN HCL 1750 MG/350ML IV SOLN
1750.0000 mg | Freq: Two times a day (BID) | INTRAVENOUS | Status: DC
  Administered 2022-08-17 – 2022-08-18 (×3): 1750 mg via INTRAVENOUS
  Filled 2022-08-17 (×4): qty 350

## 2022-08-17 MED ORDER — SODIUM CHLORIDE 0.9 % IV SOLN
1.0000 g | INTRAVENOUS | Status: DC
  Administered 2022-08-17: 1 g via INTRAVENOUS
  Filled 2022-08-17: qty 10

## 2022-08-17 MED ORDER — INSULIN ASPART 100 UNIT/ML IJ SOLN: 0.0000 [IU] | Freq: Four times a day (QID) | INTRAMUSCULAR | Status: DC

## 2022-08-17 MED ORDER — OXYCODONE HCL 5 MG PO TABS
5.0000 mg | ORAL_TABLET | ORAL | Status: DC | PRN
  Administered 2022-08-17 – 2022-08-25 (×27): 5 mg via ORAL
  Filled 2022-08-17 (×28): qty 1

## 2022-08-17 MED ORDER — BISACODYL 10 MG RE SUPP: 10.0000 mg | Freq: Every day | RECTAL | Status: DC | PRN

## 2022-08-17 MED ORDER — POTASSIUM CHLORIDE 10 MEQ/100ML IV SOLN
10.0000 meq | INTRAVENOUS | Status: AC
  Administered 2022-08-17: 10 meq via INTRAVENOUS
  Filled 2022-08-17: qty 100

## 2022-08-17 MED ORDER — FENTANYL CITRATE PF 50 MCG/ML IJ SOSY
50.0000 ug | PREFILLED_SYRINGE | INTRAMUSCULAR | Status: DC | PRN
  Administered 2022-08-17 – 2022-08-30 (×9): 50 ug via INTRAVENOUS
  Filled 2022-08-17 (×9): qty 1

## 2022-08-17 MED ORDER — ACETAMINOPHEN 650 MG RE SUPP: 650.0000 mg | Freq: Four times a day (QID) | RECTAL | Status: DC | PRN

## 2022-08-17 MED ORDER — POTASSIUM CHLORIDE 10 MEQ/100ML IV SOLN
INTRAVENOUS | Status: AC
  Administered 2022-08-17: 10 meq
  Filled 2022-08-17: qty 100

## 2022-08-17 MED ORDER — IOHEXOL 300 MG/ML  SOLN
100.0000 mL | Freq: Once | INTRAMUSCULAR | Status: AC | PRN
  Administered 2022-08-17: 100 mL via INTRAVENOUS

## 2022-08-17 MED ORDER — SENNOSIDES-DOCUSATE SODIUM 8.6-50 MG PO TABS
2.0000 | ORAL_TABLET | Freq: Two times a day (BID) | ORAL | Status: DC
  Administered 2022-08-17 – 2022-09-04 (×18): 2 via ORAL
  Filled 2022-08-17 (×31): qty 2

## 2022-08-17 MED ORDER — DEXTROSE 50 % IV SOLN
INTRAVENOUS | Status: AC
  Filled 2022-08-17: qty 50

## 2022-08-17 MED ORDER — POTASSIUM CHLORIDE IN NACL 40-0.9 MEQ/L-% IV SOLN
INTRAVENOUS | Status: AC
  Filled 2022-08-17 (×2): qty 1000

## 2022-08-17 MED ORDER — INSULIN ASPART 100 UNIT/ML IJ SOLN
0.0000 [IU] | Freq: Three times a day (TID) | INTRAMUSCULAR | Status: DC
  Administered 2022-08-17: 3 [IU] via SUBCUTANEOUS
  Administered 2022-08-19: 5 [IU] via SUBCUTANEOUS
  Administered 2022-08-19 (×2): 3 [IU] via SUBCUTANEOUS
  Administered 2022-08-20 (×2): 5 [IU] via SUBCUTANEOUS
  Administered 2022-08-20: 3 [IU] via SUBCUTANEOUS
  Administered 2022-08-21 (×2): 8 [IU] via SUBCUTANEOUS
  Administered 2022-08-21: 5 [IU] via SUBCUTANEOUS
  Administered 2022-08-22: 2 [IU] via SUBCUTANEOUS
  Administered 2022-08-22: 3 [IU] via SUBCUTANEOUS
  Administered 2022-08-22: 5 [IU] via SUBCUTANEOUS
  Administered 2022-08-23 (×2): 3 [IU] via SUBCUTANEOUS
  Administered 2022-08-23: 11 [IU] via SUBCUTANEOUS
  Administered 2022-08-24: 5 [IU] via SUBCUTANEOUS
  Administered 2022-08-24 – 2022-08-25 (×5): 3 [IU] via SUBCUTANEOUS
  Administered 2022-08-26: 5 [IU] via SUBCUTANEOUS
  Administered 2022-08-26 – 2022-09-02 (×17): 3 [IU] via SUBCUTANEOUS
  Administered 2022-09-02: 6 [IU] via SUBCUTANEOUS
  Administered 2022-09-03: 5 [IU] via SUBCUTANEOUS
  Administered 2022-09-03: 2 [IU] via SUBCUTANEOUS
  Administered 2022-09-04 (×2): 3 [IU] via SUBCUTANEOUS
  Administered 2022-09-04: 2 [IU] via SUBCUTANEOUS
  Administered 2022-09-05: 3 [IU] via SUBCUTANEOUS
  Administered 2022-09-05: 2 [IU] via SUBCUTANEOUS
  Administered 2022-09-06: 3 [IU] via SUBCUTANEOUS

## 2022-08-17 MED ORDER — ACETAMINOPHEN 325 MG PO TABS
650.0000 mg | ORAL_TABLET | Freq: Four times a day (QID) | ORAL | Status: DC | PRN
  Administered 2022-08-23 – 2022-09-04 (×6): 650 mg via ORAL
  Filled 2022-08-17 (×6): qty 2

## 2022-08-17 MED ORDER — DEXTROSE 50 % IV SOLN
12.5000 g | INTRAVENOUS | Status: AC
  Administered 2022-08-17: 12.5 g via INTRAVENOUS

## 2022-08-17 MED ORDER — NALOXONE HCL 0.4 MG/ML IJ SOLN: 0.4000 mg | INTRAMUSCULAR | Status: DC | PRN

## 2022-08-17 NOTE — Progress Notes (Signed)
Carryover admission to the Day Admitter; accepted by Dr.  Myna Hidalgo as transfer from  [facility]  to a  med-tele bed at  [facility]  for  potential prostatic abscess/psoas abscess. Please see Dr.  Criss Rosales emailed transfer documentation for additional details.   Briefly, this is a 62 year old male who is recently been hospitalized at [facility] course complicated by osteomyelitis the left first, status post left first toe amputation.  Was also found to have suggestion of prostatic abscesses as well as psoas abscess.  He was directing him did not have urology available nor did they have interventional radiology available until next Tuesday.  Given Ceron-DM discussed patient's case with on-call urology through [facility] health, Dr. Louis Meckel, who has agreed to consult and see the patient in the morning.   I have placed some additional preliminary admit orders via the adult multi-morbid admission order set.  Given the concern for prostatic abscesses with impending urology consultation to occur in the morning, I made the patient n.p.o. starting at midnight.  Additionally, given this concern for multiple potential abscesses, including prostatic abscess as well as psoas abscess I have placed orders for IV vancomycin as well as Rocephin to be started following collection of blood cultures x 2.  Also ordered prn IV fentanyl, and morning labs in the form of CMP, CBC, serum magnesium level and INR.  Also appears that the patient has history of diabetes.  I have ordered every 6 hours CBG monitoring with associated sliding scale insulin.    Babs Bertin, DO Hospitalist

## 2022-08-17 NOTE — Progress Notes (Signed)
Pharmacy Antibiotic Note  William [name] is a 62 y.o. male admitted on 08/16/2022 with f low back pain and dysuria. .  Pharmacy has been consulted to dose vancomycin for prostatic abscess.  Plan: Vancomycin 2gm IV x 1 then 1750mg  q12h (AUC 521.3, Scr 1.13) Follow renal function, cultures and clinical course Levels as needed    Temp (24hrs), Avg:98.5 F (36.9 C), Min:98.5 F (36.9 C), Max:98.5 F (36.9 C)  No results for input(s): "WBC", "CREATININE", "LATICACIDVEN", "VANCOTROUGH", "VANCOPEAK", "VANCORANDOM", "GENTTROUGH", "GENTPEAK", "GENTRANDOM", "TOBRATROUGH", "TOBRAPEAK", "TOBRARND", "AMIKACINPEAK", "AMIKACINTROU", "AMIKACIN" in the last 168 hours.  Estimated Creatinine Clearance: 122.1 mL/min (by C-G formula based on SCr of 1.13 mg/dL).    No Known Allergies  Antimicrobials this admission: 1/13 CTX > 1/13 vanc >  Dose adjustments this admission:   Microbiology results: 1/13 BCx:   Thank you for allowing pharmacy to be a part of this patient's care.  Dolly Rias RPh 08/17/2022, 12:47 AM

## 2022-08-17 NOTE — Progress Notes (Signed)
CT scan shows large prostate abscess in both prostate lobes.  Plan to take patient to the OR tomorrow for prostate abscess unroofing/TURP.  Will discuss with the patient in the morning.

## 2022-08-17 NOTE — H&P (Signed)
History and Physical    Patient: William [name] EXB:[id number] DOB: March 08, 1961 DOA: 08/16/2022 DOS: the patient was seen and examined on 08/17/2022 PCP: Pcp, No  Patient coming from: Home  Chief Complaint: No chief complaint on file.  HPI: Arham Symmonds is a 62 y.o. male with medical history significant of type 2 diabetes, hyperlipidemia, hypertension, class III obesity, severe obstructive sleep apnea, atrial fibrillation not on anticoagulation, diabetic foot ulcer, history of osteomyelitis of the left foot, AKI, acute prostatitis who was transferred from [facility] due to left flank pain in the setting of prostatic abscess.  He was seen by urology about 2 weeks ago for dysuria and difficulty urinating.  Urine culture at that time grew staph sensitive to methicillin.  She was put on ciprofloxacin for 14 days, ketorolac and tamsulosin.  He was admitted last week with osteomyelitis [facility] undergoing toe amputation.  He also had an episode of atrial fibrillation with RVR.  Cultures were positive for MSSA.  Echo was negative.  Imaging revealed iliopsoas and prostatic abscesses.  No urology or RI service available there so he was transferred to this facility.  He has been having nausea, but stated that is currently under control.  Pain is currently tolerable with current analgesic therapy. He denied fever, chills, rhinorrhea, sore throat, wheezing or hemoptysis.  No chest pain, palpitations, diaphoresis, PND, orthopnea but occasionally has had pitting edema of the lower extremities.  He has been constipated.  No diarrhea,melena or hematochezia.  Positive left flank pain, no  dysuria, frequency or hematuria.  No polyuria, polydipsia, polyphagia or blurred vision.   Review of Systems: As mentioned in the history of present illness. All other systems reviewed and are negative. Past Medical History:  Diagnosis Date   Diabetes mellitus, type II (HCC)    Hyperlipidemia    Hypertension    Past  Surgical History:  Procedure Laterality Date   LEG SURGERY Right 2021   Social History:  reports that he has never smoked. He has never used smokeless tobacco. He reports that he does not drink alcohol and does not use drugs.  No Known Allergies  Family History  Problem Relation Age of Onset   Thyroid disease Mother    Hypertension Mother    Diabetes Father    Hypertension Father    Hypertension Brother     Prior to Admission medications   Medication Sig Start Date End Date Taking? Authorizing Provider  ciprofloxacin (CIPRO) 500 MG tablet Take 1 tablet (500 mg total) by mouth 2 (two) times daily for 14 days. 08/09/22 08/23/22  Stoneking, Danford Bad., MD  meloxicam (MOBIC) 7.5 MG tablet Take 1 tablet (7.5 mg total) by mouth daily. 08/09/22   Stoneking, Danford Bad., MD  oxyCODONE (OXY IR/ROXICODONE) 5 MG immediate release tablet Take 1 tablet (5 mg total) by mouth every 6 (six) hours as needed for severe pain. 08/09/22   Stoneking, Danford Bad., MD  tamsulosin (FLOMAX) 0.4 MG CAPS capsule Take 1 capsule (0.4 mg total) by mouth daily. 08/09/22   Milderd Meager., MD    Physical Exam: Vitals:   08/17/22 0000 08/17/22 0403 08/17/22 0819  BP: 114/66 108/77 122/79  Pulse: 76 63 76  Resp: 18 18 18   Temp: 98.5 F (36.9 C) 98.7 F (37.1 C) 98.5 F (36.9 C)  TempSrc: Oral Axillary Oral  SpO2: 97% 97% 95%   Physical Exam Vitals and nursing note reviewed.  Constitutional:      General: He is awake. He is not  in acute distress.    Appearance: He is morbidly obese.  HENT:     Head: Normocephalic.     Nose: No rhinorrhea.     Mouth/Throat:     Mouth: Mucous membranes are moist.  Eyes:     General: No scleral icterus.    Pupils: Pupils are equal, round, and reactive to light.  Neck:     Vascular: No JVD.  Cardiovascular:     Rate and Rhythm: Normal rate. Rhythm irregular.     Heart sounds: S1 normal and S2 normal.  Pulmonary:     Effort: Pulmonary effort is normal.     Breath sounds:  Normal breath sounds. No wheezing, rhonchi or rales.  Abdominal:     General: Bowel sounds are normal. There is no distension.     Palpations: Abdomen is soft.     Tenderness: There is no abdominal tenderness. There is left CVA tenderness.  Musculoskeletal:     Cervical back: Neck supple.     Right lower leg: No edema.     Left lower leg: No edema.  Skin:    General: Skin is warm and dry.  Neurological:     General: No focal deficit present.     Mental Status: He is alert and oriented to person, place, and time.  Psychiatric:        Mood and Affect: Mood normal.        Behavior: Behavior is cooperative.     Data Reviewed:  There are no new results to review at this time.  Echo yesterday at [facility] Summary  1. The left ventricle is normal in size with mildly increased wall thickness.  2. The left ventricular systolic function is normal, LVEF is visually estimated at 65-70%.  3. The left atrium is mildly dilated in size.  4. The right ventricle is upper normal in size, with normal systolic function.  5. The right atrium is mildly dilated in size.  6. No evidence of valvular or endocardial vegetation.  08/16/2022 CT abdomen/pelvis with contrast at [facility]  IMPRESSION: 1. Technically challenging examination due to suboptimal contrast bolus timing. 2. Increased conspicuity of multifocal hypo densities in the prostate gland measuring up to 3.5 x 2.2 cm in the right hemi gland and 4.0 x 3.0 cm in the left hemi gland, suspicious for prostatic abscesses. 3. Right psoas abscess measures 3.2 x 2.7 cm. Heterogeneous hypoattenuation of the left psoas muscle, likely reflecting phlegmonous changes with developing abscesses. 4. Small volume pelvic free fluid. 5. Partially imaged peripheral right middle lobe ground-glass opacity, which may be infectious/inflammatory. Initial follow-up with CT at 6 months is recommended to confirm persistence. If persistent, repeat CT is recommended  every 2 years until 5 years of stability has been established. This recommendation follows the consensus statement: Guidelines for Management of Incidental Pulmonary Nodules Detected on CT Images: From the Fleischner Society 2017; Radiology 2017; 284:228-243.   Assessment and Plan: Principal Problem:   Prostatic abscess Admit to telemetry/inpatient. Antiemetics as needed. Analgesics as needed. Continue vancomycin per pharmacy. Continue ceftriaxone 2 g IVPB every 24 hours. Follow-up blood culture and sensitivity. Neurology consult appreciated. CT scan abdomen/pelvis order.  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 He is on the consult list.    Constipation  Senokot twice daily. Dulcolax suppositories.    Hypercholesteremia Not on medical therapy. Follow-up as an outpatient.    Morbid obesity ([place]) Current weight and BMI still pending. Will need lifestyle modifications. Needs  to follow-up closely with PCP. Consider bariatric clinic referral as an outpatient.    Protein-calorie malnutrition, severe ([place]) In the setting of acute infection. Continue although treatment. Protein supplementation. Consider nutritional services evaluation.    Normocytic anemia Monitor hematocrit and hemoglobin. Transfuse as needed.    A-fib (HCC) HR has been normal. None not on anticoagulation. Not on rate control meds.    Severe obstructive sleep apnea Declined CPAP at bedtime. Will continue to use nasal cannula oxygen.    Type 2 diabetes mellitus (HCC) Hemoglobin A1c 12.9%. Carbohydrate modified diet. CBG monitoring with RI SS.      Advance Care Planning:   Code Status: Full Code   Consults: Urology (Dr. Louis Meckel) Interventional radiology (Dr. Larena Glassman)  Family Communication:   Severity of Illness: The appropriate patient status for this patient is INPATIENT. Inpatient status is judged to be reasonable and necessary in order to  provide the required intensity of service to ensure the patient's safety. The patient's presenting symptoms, physical exam findings, and initial radiographic and laboratory data in the context of their chronic comorbidities is felt to place them at high risk for further clinical deterioration. Furthermore, it is not anticipated that the patient will be medically stable for discharge from the hospital within 2 midnights of admission.   * I certify that at the point of admission it is my clinical judgment that the patient will require inpatient hospital care spanning beyond 2 midnights from the point of admission due to high intensity of service, high risk for further deterioration and high frequency of surveillance required.*  Author: Reubin Milan, MD 08/17/2022 8:19 AM  For on call review [email or web address].   This document was prepared using Dragon voice recognition software and may contain some unintended transcription errors.

## 2022-08-17 NOTE — Consult Note (Signed)
I have been asked to see the patient by Dr. Tennis Must, for evaluation and management of prostatic abscess.  History of present illness: 62 year old male transferred from an outside hospital for concern of prostatic abscess.  The patient also was noted to have a psoas abscess and osteomyelitis of his left great toe.  The patient initially presented to urology at [facility] over 2 weeks ago with complaints of dysuria and difficulty voiding.  He is also having some left-sided back and flank pain.  Urine culture at that time grew greater than 100,000 staph coccus sensitive to ciprofloxacin.  He was started on Cipro for 14 days as well as given Flomax and Toradol.  He subsequently presented to the emergency department a week later and noted to have an elevated white blood cell count of 17,000.  A CT scan at that time was largely unremarkable.  He was continued on the Cipro and noted to have some improvement in his symptoms.  He was still having lots of flank pain and back pain and pain in the rectal area, but his urinary tract symptoms had improved somewhat.  On 08/12/2022 the patient was admitted to the outside hospital with DKA a and A-fib with RVR.  He was complaining of weakness shortness of breath.  He was noted to be hypoxic and his blood sugar was over 500.  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  Further evaluation with CT scan subsequently demonstrated possible prostatic abscess and psoas abscess on the left.  Review of systems: A 12 point comprehensive review of systems was obtained and is negative unless otherwise stated in the history of present illness.  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   Osteomyelitis of left foot ([place]) 08/12/2022   Sepsis with organ dysfunction ([place]) 08/12/2022   Severe obstructive sleep apnea 08/12/2022   Acute prostatitis 08/09/2022   Urinary tract infection without hematuria 08/09/2022   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 diabetes mellitus, with fat layer exposed ([place]) 09/07/2021   Diabetes mellitus with hyperglycemia, without long-term current use of insulin ([place]) 05/23/2016   Hypercholesteremia 05/23/2016   Morbid obesity ([place]) 05/23/2016    No current facility-administered medications on file prior to encounter.   Current Outpatient Medications on File Prior to Encounter  Medication Sig Dispense Refill   ciprofloxacin (CIPRO) 500 MG tablet Take 1 tablet (500 mg total) by mouth 2 (two) times daily for 14 days. 28 tablet 0   meloxicam (MOBIC) 7.5 MG tablet Take 1 tablet (7.5 mg total) by mouth daily. 14 tablet 1   oxyCODONE (OXY IR/ROXICODONE) 5 MG immediate release tablet Take 1 tablet (5 mg total) by mouth every 6 (six) hours as needed for severe pain. 15 tablet 0   tamsulosin (FLOMAX) 0.4 MG CAPS capsule Take 1 capsule (0.4 mg total) by mouth daily. 30 capsule 1    Past Medical History:  Diagnosis Date   Diabetes mellitus, type II ([place])    Hyperlipidemia    Hypertension     Past Surgical History:  Procedure Laterality Date  LEG SURGERY Right 2021    Social History   Tobacco Use   Smoking status: Never   Smokeless tobacco: Never  Vaping Use   Vaping Use: Never used  Substance Use Topics   Alcohol use: No   Drug use: No    Family History  Problem Relation Age of Onset   Thyroid disease Mother    Hypertension Mother    Diabetes Father    Hypertension Father    Hypertension Brother     PE: Vitals:   08/17/22 0000 08/17/22 0403 08/17/22 0819 08/17/22  1155  BP: 114/66 108/77 122/79 (!) 145/83  Pulse: 76 63 76 60  Resp: 18 18 18 18   Temp: 98.5 F (36.9 C) 98.7 F (37.1 C) 98.5 F (36.9 C) 98.6 F (37 C)  TempSrc: Oral Axillary Oral   SpO2: 97% 97% 95% 99%   Patient appears to be in no acute distress  patient is alert and oriented x3 Atraumatic normocephalic head No cervical or supraclavicular lymphadenopathy appreciated No increased work of breathing, no audible wheezes/rhonchi Regular sinus rhythm/rate Abdomen is soft, nontender, nondistended, no CVA or suprapubic tenderness Lower extremities are symmetric without appreciable edema Grossly neurologically intact No identifiable skin lesions  Recent Labs    08/17/22 0115  WBC 18.5*  HGB 10.8*  HCT 34.1*   Recent Labs    08/17/22 0115  NA 136  K 3.1*  CL 104  CO2 26  GLUCOSE 99  BUN 15  CREATININE 0.95  CALCIUM 7.4*   Recent Labs    08/17/22 0115  INR 1.4*   No results for input(s): "LABURIN" in the last 72 hours. Results for orders placed or performed during the hospital encounter of 04/26/19  SARS CORONAVIRUS 2 (TAT 6-24 HRS) Nasopharyngeal Nasopharyngeal Swab     Status: None   Collection Time: 04/26/19  7:03 AM   Specimen: Nasopharyngeal Swab  Result Value Ref Range Status   SARS Coronavirus 2 NEGATIVE NEGATIVE Final    Comment: (NOTE) SARS-CoV-2 target nucleic acids are NOT DETECTED. The SARS-CoV-2 RNA is generally detectable in upper and lower respiratory specimens during the acute phase of infection. Negative results do not preclude SARS-CoV-2 infection, do not rule out co-infections with other pathogens, and should not be used as the sole basis for treatment or other patient management decisions. Negative results must be combined with clinical observations, patient history, and epidemiological information. The expected result is Negative. Fact Sheet for Patients: SugarRoll.be Fact Sheet for Healthcare  Providers: https://www.woods-mathews.com/ This test is not yet approved or cleared by the [place] FDA and  has been authorized for detection and/or diagnosis of SARS-CoV-2 by FDA under an Emergency Use Authorization (EUA). This EUA will remain  in effect (meaning this test can be used) for the duration of the COVID-19 declaration under Section 56 4(b)(1) of the Act, 21 U.S.C. section 360bbb-3(b)(1), unless the authorization is terminated or revoked sooner. Performed at [facility], [place] [address], [place], [place] [address]     Imaging: I have read the CT scan reports from [facility], but have not seen his images.  This describes a psoas abscess and prostatic abscess.  I have reviewed his admission notes from [facility], no discharge summary provided.   Imp:  MSSA bacteremia from unclear source.  Recommendations:  repeat CT scan with contrast to assess for abscess of prostate and any additional sources.  Potentially he'll need a drain or TURP tomorrow morning.  I have ordered a carb  modified diet and made him NPO again at [facility].  Will f/u after CT scan is complete for further plan.   Thank you for involving me in this patient's care,  Please page with any further questions or concerns. Crist Fat

## 2022-08-18 ENCOUNTER — Encounter (HOSPITAL_COMMUNITY): Payer: Self-pay | Admitting: Internal Medicine

## 2022-08-18 ENCOUNTER — Encounter (HOSPITAL_COMMUNITY)
Admission: AD | Disposition: A | Discharge status: Skilled Nursing Facility | Payer: Self-pay | Source: Other Acute Inpatient Hospital | Attending: Family Medicine

## 2022-08-18 ENCOUNTER — Inpatient Hospital Stay (HOSPITAL_COMMUNITY): Payer: Commercial Managed Care - PPO | Admitting: Anesthesiology

## 2022-08-18 DIAGNOSIS — E1165 Type 2 diabetes mellitus with hyperglycemia: Secondary | ICD-10-CM | POA: Diagnosis not present

## 2022-08-18 DIAGNOSIS — E876 Hypokalemia: Secondary | ICD-10-CM | POA: Insufficient documentation

## 2022-08-18 DIAGNOSIS — B9561 Methicillin susceptible Staphylococcus aureus infection as the cause of diseases classified elsewhere: Secondary | ICD-10-CM | POA: Diagnosis not present

## 2022-08-18 DIAGNOSIS — K6812 Psoas muscle abscess: Secondary | ICD-10-CM | POA: Diagnosis not present

## 2022-08-18 DIAGNOSIS — R7881 Bacteremia: Secondary | ICD-10-CM | POA: Diagnosis not present

## 2022-08-18 DIAGNOSIS — Z6841 Body Mass Index (BMI) 40.0 and over, adult: Secondary | ICD-10-CM

## 2022-08-18 DIAGNOSIS — N412 Abscess of prostate: Secondary | ICD-10-CM

## 2022-08-18 DIAGNOSIS — Z794 Long term (current) use of insulin: Secondary | ICD-10-CM

## 2022-08-18 DIAGNOSIS — I10 Essential (primary) hypertension: Secondary | ICD-10-CM | POA: Diagnosis not present

## 2022-08-18 HISTORY — PX: TRANSURETHRAL RESECTION OF PROSTATE: SHX73

## 2022-08-18 LAB — GLUCOSE, CAPILLARY
Glucose-Capillary: 107 mg/dL — ABNORMAL HIGH (ref 70–99)
Glucose-Capillary: 102 mg/dL — ABNORMAL HIGH (ref 70–99)
Glucose-Capillary: 95 mg/dL (ref 70–99)
Glucose-Capillary: 108 mg/dL — ABNORMAL HIGH (ref 70–99)
Glucose-Capillary: 165 mg/dL — ABNORMAL HIGH (ref 70–99)

## 2022-08-18 LAB — BASIC METABOLIC PANEL
Anion gap: 7 (ref 5–15)
BUN: 12 mg/dL (ref 6–20)
CO2: 25 mmol/L (ref 22–32)
Calcium: 7.5 mg/dL — ABNORMAL LOW (ref 8.9–10.3)
Chloride: 104 mmol/L (ref 98–111)
Creatinine, Ser: 0.79 mg/dL (ref 0.61–1.24)
GFR, Estimated: >60 mL/min (ref >60)
Glucose, Bld: 106 mg/dL — ABNORMAL HIGH (ref 70–99)
Potassium: 3.3 mmol/L — ABNORMAL LOW (ref 3.5–5.1)
Sodium: 136 mmol/L (ref 135–145)

## 2022-08-18 LAB — CBC
HCT: 32.9 % — ABNORMAL LOW (ref 39.0–52.0)
Hemoglobin: 10.5 g/dL — ABNORMAL LOW (ref 13.0–17.0)
MCH: 26.7 pg (ref 26.0–34.0)
MCHC: 31.9 g/dL (ref 30.0–36.0)
MCV: 83.7 fL (ref 80.0–100.0)
Platelets: 219 10*3/uL (ref 150–400)
RBC: 3.93 MIL/uL — ABNORMAL LOW (ref 4.22–5.81)
RDW: 14.8 % (ref 11.5–15.5)
WBC: 17.6 10*3/uL — ABNORMAL HIGH (ref 4.0–10.5)
nRBC: 0 % (ref 0.0–0.2)

## 2022-08-18 SURGERY — TURP (TRANSURETHRAL RESECTION OF PROSTATE)
Anesthesia: General | Site: Prostate

## 2022-08-18 MED ORDER — STERILE WATER FOR IRRIGATION IR SOLN
Status: DC | PRN
  Administered 2022-08-18: 1000 mL

## 2022-08-18 MED ORDER — LACTATED RINGERS IV SOLN: INTRAVENOUS | Status: DC | PRN

## 2022-08-18 MED ORDER — ROCURONIUM BROMIDE 10 MG/ML (PF) SYRINGE
PREFILLED_SYRINGE | INTRAVENOUS | Status: DC | PRN
  Administered 2022-08-18: 80 mg via INTRAVENOUS

## 2022-08-18 MED ORDER — SODIUM CHLORIDE 0.9 % IR SOLN
Status: DC | PRN
  Administered 2022-08-18: 1000 mL

## 2022-08-18 MED ORDER — OXYCODONE HCL 5 MG PO TABS: 5.0000 mg | ORAL_TABLET | Freq: Once | ORAL | Status: DC | PRN

## 2022-08-18 MED ORDER — ACETAMINOPHEN 10 MG/ML IV SOLN: 1000.0000 mg | Freq: Once | INTRAVENOUS | Status: DC | PRN

## 2022-08-18 MED ORDER — FENTANYL CITRATE PF 50 MCG/ML IJ SOSY
25.0000 ug | PREFILLED_SYRINGE | INTRAMUSCULAR | Status: DC | PRN
  Administered 2022-08-18 (×2): 50 ug via INTRAVENOUS

## 2022-08-18 MED ORDER — FENTANYL CITRATE (PF) 250 MCG/5ML IJ SOLN
INTRAMUSCULAR | Status: AC
  Filled 2022-08-18: qty 5

## 2022-08-18 MED ORDER — SODIUM CHLORIDE 0.9 % IR SOLN
3000.0000 mL | Status: DC
  Administered 2022-08-18 – 2022-08-19 (×3): 3000 mL

## 2022-08-18 MED ORDER — SODIUM CHLORIDE 0.9 % IR SOLN
Status: DC | PRN
  Administered 2022-08-18: 24000 mL

## 2022-08-18 MED ORDER — SUGAMMADEX SODIUM 500 MG/5ML IV SOLN
INTRAVENOUS | Status: AC
  Filled 2022-08-18: qty 5

## 2022-08-18 MED ORDER — ACETAMINOPHEN 10 MG/ML IV SOLN
INTRAVENOUS | Status: DC | PRN
  Administered 2022-08-18: 1000 mg via INTRAVENOUS

## 2022-08-18 MED ORDER — SUGAMMADEX SODIUM 500 MG/5ML IV SOLN
INTRAVENOUS | Status: DC | PRN
  Administered 2022-08-18: 500 mg via INTRAVENOUS

## 2022-08-18 MED ORDER — ACETAMINOPHEN 500 MG PO TABS: 1000.0000 mg | ORAL_TABLET | Freq: Once | ORAL | Status: DC | PRN

## 2022-08-18 MED ORDER — TAMSULOSIN HCL 0.4 MG PO CAPS: 0.4000 mg | ORAL_CAPSULE | Freq: Every day | ORAL | Status: DC

## 2022-08-18 MED ORDER — ONDANSETRON HCL 4 MG/2ML IJ SOLN
INTRAMUSCULAR | Status: DC | PRN
  Administered 2022-08-18: 4 mg via INTRAVENOUS

## 2022-08-18 MED ORDER — FAMOTIDINE IN NACL 20-0.9 MG/50ML-% IV SOLN
20.0000 mg | Freq: Once | INTRAVENOUS | Status: AC
  Administered 2022-08-18: 20 mg via INTRAVENOUS
  Filled 2022-08-18: qty 50

## 2022-08-18 MED ORDER — OXYCODONE HCL 5 MG/5ML PO SOLN: 5.0000 mg | Freq: Once | ORAL | Status: DC | PRN

## 2022-08-18 MED ORDER — ACETAMINOPHEN 10 MG/ML IV SOLN
INTRAVENOUS | Status: AC
  Filled 2022-08-18: qty 100

## 2022-08-18 MED ORDER — CEFAZOLIN SODIUM-DEXTROSE 2-4 GM/100ML-% IV SOLN
2.0000 g | Freq: Three times a day (TID) | INTRAVENOUS | Status: DC
  Administered 2022-08-18 – 2022-09-06 (×54): 2 g via INTRAVENOUS
  Filled 2022-08-18 (×54): qty 100

## 2022-08-18 MED ORDER — PHENYLEPHRINE 80 MCG/ML (10ML) SYRINGE FOR IV PUSH (FOR BLOOD PRESSURE SUPPORT)
PREFILLED_SYRINGE | INTRAVENOUS | Status: AC
  Filled 2022-08-18: qty 10

## 2022-08-18 MED ORDER — ROCURONIUM BROMIDE 10 MG/ML (PF) SYRINGE
PREFILLED_SYRINGE | INTRAVENOUS | Status: AC
  Filled 2022-08-18: qty 10

## 2022-08-18 MED ORDER — MIDAZOLAM HCL 5 MG/5ML IJ SOLN
INTRAMUSCULAR | Status: DC | PRN
  Administered 2022-08-18: 2 mg via INTRAVENOUS

## 2022-08-18 MED ORDER — LIDOCAINE 2% (20 MG/ML) 5 ML SYRINGE
INTRAMUSCULAR | Status: DC | PRN
  Administered 2022-08-18: 100 mg via INTRAVENOUS

## 2022-08-18 MED ORDER — SUCCINYLCHOLINE CHLORIDE 200 MG/10ML IV SOSY
PREFILLED_SYRINGE | INTRAVENOUS | Status: AC
  Filled 2022-08-18: qty 10

## 2022-08-18 MED ORDER — POTASSIUM CHLORIDE CRYS ER 20 MEQ PO TBCR
40.0000 meq | EXTENDED_RELEASE_TABLET | Freq: Once | ORAL | Status: AC
  Administered 2022-08-18: 40 meq via ORAL
  Filled 2022-08-18: qty 2

## 2022-08-18 MED ORDER — FENTANYL CITRATE PF 50 MCG/ML IJ SOSY
PREFILLED_SYRINGE | INTRAMUSCULAR | Status: AC
  Filled 2022-08-18: qty 3

## 2022-08-18 MED ORDER — TAMSULOSIN HCL 0.4 MG PO CAPS
0.4000 mg | ORAL_CAPSULE | Freq: Every day | ORAL | Status: DC
  Administered 2022-08-19 – 2022-09-06 (×19): 0.4 mg via ORAL
  Filled 2022-08-18 (×19): qty 1

## 2022-08-18 MED ORDER — SUCCINYLCHOLINE CHLORIDE 200 MG/10ML IV SOSY
PREFILLED_SYRINGE | INTRAVENOUS | Status: DC | PRN
  Administered 2022-08-18: 160 mg via INTRAVENOUS

## 2022-08-18 MED ORDER — PROPOFOL 10 MG/ML IV BOLUS
INTRAVENOUS | Status: AC
  Filled 2022-08-18: qty 20

## 2022-08-18 MED ORDER — ACETAMINOPHEN 160 MG/5ML PO SOLN: 1000.0000 mg | Freq: Once | ORAL | Status: DC | PRN

## 2022-08-18 MED ORDER — PROPOFOL 10 MG/ML IV BOLUS
INTRAVENOUS | Status: DC | PRN
  Administered 2022-08-18: 150 mg via INTRAVENOUS

## 2022-08-18 MED ORDER — MIDAZOLAM HCL 2 MG/2ML IJ SOLN
INTRAMUSCULAR | Status: AC
  Filled 2022-08-18: qty 2

## 2022-08-18 MED ORDER — FENTANYL CITRATE (PF) 100 MCG/2ML IJ SOLN
INTRAMUSCULAR | Status: DC | PRN
  Administered 2022-08-18: 100 ug via INTRAVENOUS
  Administered 2022-08-18: 50 ug via INTRAVENOUS

## 2022-08-18 MED ORDER — PHENYLEPHRINE HCL-NACL 20-0.9 MG/250ML-% IV SOLN
INTRAVENOUS | Status: DC | PRN
  Administered 2022-08-18: 160 ug via INTRAVENOUS
  Administered 2022-08-18: 55 ug/min via INTRAVENOUS

## 2022-08-18 SURGICAL SUPPLY — 29 items
BAG DRN RND TRDRP ANRFLXCHMBR (UROLOGICAL SUPPLIES) ×1
BAG URINE DRAIN 2000ML AR STRL (UROLOGICAL SUPPLIES) ×2 IMPLANT
BAG URO CATCHER STRL LF (MISCELLANEOUS) ×2 IMPLANT
BNDG GAUZE DERMACEA FLUFF 4 (GAUZE/BANDAGES/DRESSINGS) IMPLANT
BNDG GZE DERMACEA 4 6PLY (GAUZE/BANDAGES/DRESSINGS) ×1
CATH FOLEY 3WAY 30CC 24FR (CATHETERS) ×1
CATH URTH STD 24FR FL 3W 2 (CATHETERS) IMPLANT
CNTNR URN SCR LID CUP LEK RST (MISCELLANEOUS) IMPLANT
CONT SPEC 4OZ STRL OR WHT (MISCELLANEOUS) ×1
DRAPE FOOT SWITCH (DRAPES) ×2 IMPLANT
ELECT REM PT RETURN 15FT ADLT (MISCELLANEOUS) ×2 IMPLANT
GLOVE BIO SURGEON STRL SZ 6.5 (GLOVE) IMPLANT
GLOVE BIO SURGEON STRL SZ7.5 (GLOVE) ×2 IMPLANT
GLOVE BIOGEL PI IND STRL 6.5 (GLOVE) IMPLANT
GLOVE BIOGEL PI IND STRL 7.5 (GLOVE) IMPLANT
GOWN STRL REUS W/ TWL LRG LVL3 (GOWN DISPOSABLE) ×4 IMPLANT
GOWN STRL REUS W/ TWL XL LVL3 (GOWN DISPOSABLE) ×2 IMPLANT
GOWN STRL REUS W/TWL LRG LVL3 (GOWN DISPOSABLE) ×2
GOWN STRL REUS W/TWL XL LVL3 (GOWN DISPOSABLE) ×1
HOLDER FOLEY CATH W/STRAP (MISCELLANEOUS) IMPLANT
KIT TURNOVER KIT A (KITS) IMPLANT
LOOP CUT BIPOLAR 24F LRG (ELECTROSURGICAL) IMPLANT
MANIFOLD NEPTUNE II (INSTRUMENTS) ×2 IMPLANT
PACK CYSTO (CUSTOM PROCEDURE TRAY) ×2 IMPLANT
SYR 30ML LL (SYRINGE) IMPLANT
SYR TOOMEY IRRIG 70ML (MISCELLANEOUS) ×1
SYRINGE TOOMEY IRRIG 70ML (MISCELLANEOUS) ×2 IMPLANT
TUBING CONNECTING 10 (TUBING) ×2 IMPLANT
TUBING UROLOGY SET (TUBING) ×2 IMPLANT

## 2022-08-18 NOTE — Assessment & Plan Note (Signed)
S/p LEFT great toe partial amputation on 1/11 by Dr. Rocco Serene Discussed with Dr. Rocco Serene, surgeon 1/15 by phone Personally inspected toe today, looks good, no drainage, dehiscence, see below - Change dressing daily -- xeroform, wrapped in gauze, wrapped in ACE wrap - WBAT in post op shoe - After two weeks (so around Jan 25) stop Xeroform in dressing and use adaptic - Leave sutures in place for 1 month (remove Feb 11) - Follow upw with Dr. Rocco Serene in [place] within 1 month, preferably within a week of discharge to home or SNF

## 2022-08-18 NOTE — Assessment & Plan Note (Signed)
Glucoses controlled, A1c >12% - Continue Ss corrections

## 2022-08-18 NOTE — Assessment & Plan Note (Signed)
-  CPAP at night °

## 2022-08-18 NOTE — Progress Notes (Signed)
Pharmacy Antibiotic Note  William [name] is a 62 y.o. male admitted on 08/16/2022 with prostatic abscess and MSSA bacteremia.  Pharmacy has been consulted by ID for Ancef dosing.  Plan: Ancef 2 g IV every 8 hours Monitor clinical progress and renal function F/U C&S, ID plans, LOT   Weight:  (bed doesn't work)  Temp (24hrs), Avg:98.5 F (36.9 C), Min:97.8 F (36.6 C), Max:99.1 F (37.3 C)  Recent Labs  Lab 08/17/22 0115 08/18/22 0511  WBC 18.5* 17.6*  CREATININE 0.95 0.79    Estimated Creatinine Clearance: 172.4 mL/min (by C-G formula based on SCr of 0.79 mg/dL).    Allergies  Allergen Reactions   Diltiazem Hcl Other (See Comments)    Causes accelerated heart rate    Antimicrobials this admission: 1/13 Rocephin >> 1/14 1/13 vancomycin >> 1/14 1/14 Ancef >>  Microbiology results: 1/8   BCx:  positive at [facility] for MSSA in 2/2  1/13 BCx: ngtd  Thank you for allowing pharmacy to be a part of this patient's care.  William [name], PharmD, BCPS Clinical Pharmacist [place] Please utilize Amion for appropriate phone number to reach the unit pharmacist (Rosalie) 08/18/2022 5:34 PM

## 2022-08-18 NOTE — Assessment & Plan Note (Signed)
Hgb 10 in setting of recent orthopedic surgery

## 2022-08-18 NOTE — Hospital Course (Signed)
William [name] is a 62 y.o. M with DM, HTN, MO, hyperthyroidism as well as recent osteomyelitis of the left great toe status post amputation, who was transferred from outside hospital for prostatic abscess.  2 weeks prior to admission patient diagnosed with prostatitis, treated with Cipro for MSSA.  1 week ago, patient presented to outside hospital with atrial fibrillation, sepsis symptomology, found to have MSSA bacteremia, iliopsoas and prostatic abscesses.     1/13: Transferred to [place], Urology and IR consulted 1/14: To the OR for prostate abscess treatment

## 2022-08-18 NOTE — Assessment & Plan Note (Signed)
-  Supplement K

## 2022-08-18 NOTE — Progress Notes (Signed)
Consult received to d/c central line.  On assessment, central line appears to be tunneled and/or placed by MD/IR.  Bedside RN was advised to obtain order from MD to d/c central line by IR.

## 2022-08-18 NOTE — Assessment & Plan Note (Signed)
CT from 1/13 shows possibly a 3x3x10cm psoas abscess - Consult IR for aspiratieon and possibly drain

## 2022-08-18 NOTE — Consult Note (Signed)
[place] for Infectious Disease    Date of Admission:  08/16/2022     Reason for Consult: complicated mssa bacteremia    Referring Provider: Loleta Books    Lines:  1/08-c outside facility placed cvc triple lumen  1/13-c foley catheter  Abx: 1/14-c cefazolin  1/13-14 ceftriaxone/vanc        Assessment: 62 yo male with afib, hypothyroidism, dm2, admitted 1/08 to [facility] with severe sepsis found to have disseminated mssa infection, complicated by dka, transferred to [facility] 1/12 for further management of prostatic abscess. Current issues:  #mssa bsi #prostatic abscess #bilateral psoas  The source for bacteremia was likely the left great toe ulcer; it is never a primary GU infection this organism. This had then seeded the gu system when he was first seen at [facility] and treated as uti/prostatitis with cipro.   The cipro might have had some mssa suppression but will always fail in monotherapy, and I suspect he was not as sick as can be with more complication due to brief "treatment" with cipro  And given the psoas abscesses, this is presumed to have lumbar osteomyelitis with spill over into psoas, so this warranted an OM course of treatment  Tte was negative on 08/16/22 at [facility]. Tx will be at least 6 weeks and unless he developed sepsis/mssa bacteremia again I won't do tee  The central line from 1/8th placement is likely seeded and need to be removed otherwise he'll relapse   Micro: 1/08 [facility] blood cx mssa; no repeat bcx done there 1/13 bcx ngtd  Procedure/imaging: 1/14 urology performed prostatic abscess I&D; foley placed    #left diabetic foot ulcer He denies cellulitis change or purulence, but this likely served as portal of entry for the staph aureus. The distal phalanx/ulcer was disarticulated on 1/11 at [facility]. He had finished tx for this   Plan: F/u bcx Change abx to cefazolin Discussed with primary team to have right ij central line  removed IR had been consulted and will place psoas perc drain tomorrow Will need close monitoring within the next several days for other seeding complication If he refevered I would repeat blood culture  Discussed with primary team    I spent 75 minute reviewing data/chart, and coordinating care and >50% direct face to face time providing counseling/discussing diagnostics/treatment plan with patient       ------------------------------------------------ Principal Problem:   MSSA bacteremia Active Problems:   Hypercholesteremia   Morbid obesity ([place])   Prostatic abscess   Protein-calorie malnutrition, severe ([place])   Normocytic anemia   Paroxysmal atrial fibrillation ([place])   Osteomyelitis of left foot ([facility])   Severe obstructive sleep apnea   Type 2 diabetes mellitus ([place])   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 male transferred from [facility] for mssa infection  Chart reviewed Discussed with patient  1/8-12 [facility] Initial presumed uti Dka also found and afib/rvr 1/8 bcx mssa (S doxy, bactrim), and mention of "persistent" bacteremia (another 2 set done 1/11 and I don't have results for) Ucx also mssa Ct abd/pelv with prostatic abscess and bilateral posas abscess Tte no obvious abscess A right ij was placed 1/08 and was kept during transfer despite persistent bacteremia Abx narrowed to cefazolin prior to transfer Initial wbc 31 trending down at time of transfer He also has xray imaging of left great toe OM and underwent amputation 1/11 (disarticulation at the ip  joint -- and proximal phalanx appeared normal and no purulence encountered) No ID consultative service there due to ?availability  [facility] was also [facility] for [facility]" [facility]/31/22 urgent care and given 14 days cipro for urine culture that grew mssa. I am unclear if this was followed up; there was no chart between 07/31/22 and 08/12/22 admission at [facility].   Patient on  transfer has wbc 19; afebrile; hds He has repeat abd/pelv ct that showed basically worsening bilateral psoas and prostatic abscess 1/13 bcx is ngtd He continues cefazolin He still have the outside hospital placed central line Urology performed prostatic abscess debridement today He'll have IR place drain for the psoas abscesses as well  Reviewed operative note from urology 1/14 procedure: # 1: The abscess was bilateral.  I was able to get into a both cavities and had extensive release of purulent drainage on both sides.  I ensured that this area was widely patent and able to drain freely. 2.:  The bladder was otherwise unremarkable with no significant abnormalities, orthotopic ureteral orifice ease. 3.:  A 24 French three-way Foley catheter was left in place on continuous bladder irrigation.     At this time patient denies headache, visual change, other focal joint pain He has had lower back/bilateral paralumbar pain since around 12/27  He has no prosthetic or other vascular device in his body  He has no prior source for bsi in terms of peripheral iv's/medical/dental procedures. He did have a chronic left big toe ulcer as mentioned above and hallux was amputated at [facility] during 1/8-12 admission  He denies ivdu; works for [employer] office     Family History  Problem Relation Age of Onset   Thyroid disease Mother    Hypertension Mother    Diabetes Father    Hypertension Father    Hypertension Brother     Social History   Tobacco Use   Smoking status: Never   Smokeless tobacco: Never  Vaping Use   Vaping Use: Never used  Substance Use Topics   Alcohol use: No   Drug use: No    Allergies  Allergen Reactions   Diltiazem Hcl Other (See Comments)    Causes accelerated heart rate    Review of Systems: ROS All Other ROS was negative, except mentioned above   Past Medical History:  Diagnosis Date   Diabetes mellitus, type II ([place])    Hyperlipidemia    Hypertension         Scheduled Meds:  [MAR Hold] Chlorhexidine Gluconate Cloth  6 each Topical Daily   [MAR Hold] insulin aspart  0-15 Units Subcutaneous TID WC   [MAR Hold] senna-docusate  2 tablet Oral BID   Continuous Infusions:  [MAR Hold] cefTRIAXone (ROCEPHIN)  IV 2 g (08/18/22 0143)   [MAR Hold] vancomycin 1,750 mg (08/18/22 0229)   PRN Meds:.[MAR Hold] acetaminophen **OR** [MAR Hold] acetaminophen, [MAR Hold] bisacodyl, [MAR Hold] fentaNYL (SUBLIMAZE) injection, [MAR Hold] naLOXone (NARCAN)  injection, [MAR Hold] oxyCODONE   OBJECTIVE: Blood pressure (!) 103/97, pulse 78, temperature 98.1 F (36.7 C), temperature source Oral, resp. rate 17, SpO2 97 %.  Physical Exam General/constitutional: no distress, pleasant HEENT: Normocephalic, PER, Conj Clear, EOMI, Oropharynx clear Neck supple CV: rrr no mrg Lungs: clear to auscultation, normal respiratory effort Abd: Soft, Nontender Ext: no edema Skin: No Rash Neuro: nonfocal MSK: left foot dressing c/d/I; no cellulitis or swelling noted outside of dressing  Gu; 3way bladder irrigation noted -- foley catheter draining clear serosanguinous  output   Central line presence: right ij site no purulence    Lab Results Lab Results  Component Value Date   WBC 17.6 (H) 08/18/2022   HGB 10.5 (L) 08/18/2022   HCT 32.9 (L) 08/18/2022   MCV 83.7 08/18/2022   PLT 219 08/18/2022    Lab Results  Component Value Date   CREATININE 0.79 08/18/2022   BUN 12 08/18/2022   NA 136 08/18/2022   K 3.3 (L) 08/18/2022   CL 104 08/18/2022   CO2 25 08/18/2022    Lab Results  Component Value Date   ALT 30 08/17/2022   AST 41 08/17/2022   ALKPHOS 121 08/17/2022   BILITOT 0.5 08/17/2022      Microbiology: Recent Results (from the past 240 hour(s))  Culture, blood (Routine X 2) w Reflex to ID Panel     Status: None (Preliminary result)   Collection Time: 08/17/22  1:15 AM   Specimen: BLOOD  Result Value Ref Range Status   Specimen Description    Final    BLOOD BLOOD LEFT HAND Performed at [facility], [address] [place], [place] [address]    Special Requests   Final    BOTTLES DRAWN AEROBIC ONLY SITE NOT SPECIFIED Performed at [facility], [address], [place], [place] [address]    Culture   Final    NO GROWTH 1 DAY Performed at [facility], [address], [place], [place] [address]    Report Status PENDING  Incomplete  Culture, blood (Routine X 2) w Reflex to ID Panel     Status: None (Preliminary result)   Collection Time: 08/17/22  1:15 AM   Specimen: BLOOD  Result Value Ref Range Status   Specimen Description   Final    BLOOD BLOOD RIGHT HAND Performed at [facility], [address], [place], [place] [address]    Special Requests   Final    BOTTLES DRAWN AEROBIC ONLY Blood Culture adequate volume Performed at [facility], [address], [place], [place] [address]    Culture   Final    NO GROWTH 1 DAY Performed at [facility], [address], [place], [place] [address]    Report Status PENDING  Incomplete     Serology:    Imaging: If present, new imagings (plain films, ct scans, and mri) have been personally visualized and interpreted; radiology reports have been reviewed. Decision making incorporated into the Impression / Recommendations.  1/13 abd pelv wwo contrast ct 1. Massively enlarged and heterogeneous appearing prostate gland with multiple rim enhancing low-attenuation areas, presumably prostatic abscesses, as detailed above. 2. Interval development of space-occupying low-attenuation regions in the psoas musculature bilaterally, concerning for psoas abscesses. Musculoskeletal: Compared to the prior examination there are new space-occupying areas of low attenuation in the psoas musculature bilaterally, concerning for psoas abscesses (poorly evaluated secondary to suboptimal contrast bolus), well  appreciated on axial image 72 of series 4, particularly when narrowing the window settings. The largest of these is on the right side (axial image 72 of series 4 and coronal image 68 of series 8) estimated to measure approximately 3.2 x 2.8 x 6.8 cm. 3. Hepatic steatosis. 4. Mild colonic diverticulosis without evidence of acute diverticulitis at this time.    Outside facility imaging: 1/08 [facility]  1. New right IJ vascular catheter with tip terminating in the right  atrium.  2. Low lung volumes. Otherwise, no significant interval  change in  lung aeration since the radiograph from earlier the same day.     1/12 ct abd pelv with contrast 1. Technically challenging examination due to suboptimal contrast  bolus timing.  2. Increased conspicuity of multifocal hypo densities in the  prostate gland measuring up to 3.5 x 2.2 cm in the right hemi gland  and 4.0 x 3.0 cm in the left hemi gland, suspicious for prostatic  abscesses.  3. Right psoas abscess measures 3.2 x 2.7 cm. Heterogeneous  hypoattenuation of the left psoas muscle, likely reflecting  phlegmonous changes with developing abscesses.  4. Small volume pelvic free fluid.  5. Partially imaged peripheral right middle lobe ground-glass  opacity, which may be infectious/inflammatory. Initial follow-up  with CT at 6 months is recommended to confirm persistence. If  persistent, repeat CT is recommended every 2 years until 5 years of  stability has been established   1/12 tte  1. The left ventricle is normal in size with mildly increased wall  thickness.   2. The left ventricular systolic function is normal, LVEF is visually  estimated at 65-70%.    3. The left atrium is mildly dilated in size.    4. The right ventricle is upper normal in size, with normal systolic  function.   5. The right atrium is mildly dilated in size.    6. No evidence of valvular or endocardial vegetation.   Raymondo Band, MD [facility] [phone] pager    08/18/2022, 10:44 AM

## 2022-08-18 NOTE — Assessment & Plan Note (Signed)
S/p TURP, prostate abscess unroofing 1/14 by Dr. Louis Meckel - Post op care per Urology

## 2022-08-18 NOTE — Progress Notes (Signed)
IR was requested for R psoas abscess aspiration and possible drain placement.   Patient is 62 y.o. male with medical history significant of type 2 diabetes, hyperlipidemia, hypertension, class III obesity, severe obstructive sleep apnea, atrial fibrillation not on anticoagulation, diabetic foot ulcer, history of osteomyelitis of the left foot, AKI, acute prostatitis who was transferred to [facility] from [facility] on 08/17/22,  s/p TURP, prostate abscess unroofing today, CT today also showed  Interval development of space-occupying low-attenuation regions in the psoas musculature bilaterally, concerning for psoas abscesses.  Case reviewed and approved by Dr. Annamaria Boots.   VSS WBC 17.6; Hgb 10.5, plt 219, INR 1.4 yesterday, not on AC/AP  The procedure is tentatively scheduled for tomorrow pending IR schedule.   PLAN - NPO at [facility] - No AC/AP till procedure is done  - Formal consult to follow  Please call IR for questions and concerns.    Armando Gang Eugena Rhue PA-C 08/18/2022 2:21 PM

## 2022-08-18 NOTE — Anesthesia Procedure Notes (Signed)
Procedure Name: Intubation Date/Time: 08/18/2022 10:58 AM  Performed by: Lavina Hamman, CRNAPre-anesthesia Checklist: Patient identified, Emergency Drugs available, Suction available, Patient being monitored and Timeout performed Patient Re-evaluated:Patient Re-evaluated prior to induction Oxygen Delivery Method: Circle system utilized Preoxygenation: Pre-oxygenation with 100% oxygen Induction Type: IV induction and Rapid sequence Laryngoscope Size: Mac and 4 Grade View: Grade I Tube type: Oral Tube size: 7.5 mm Number of attempts: 1 Airway Equipment and Method: Stylet Placement Confirmation: ETT inserted through vocal cords under direct vision, positive ETCO2, CO2 detector and breath sounds checked- equal and bilateral Secured at: 23 cm Tube secured with: Tape Dental Injury: Teeth and Oropharynx as per pre-operative assessment  Comments: ATOI

## 2022-08-18 NOTE — Progress Notes (Signed)
  Progress Note   Patient: William [name] WUJ:[id number] DOB: [birth year] DOA: 08/16/2022     2 DOS: the patient was seen and examined on 08/18/2022 at 9:42AM      Brief hospital course: Mr. Shell is a 62 y.o. M with DM, HTN, MO, hyperthyroidism as well as recent osteomyelitis of the left great toe status post amputation, who was transferred from outside hospital for prostatic abscess.  2 weeks prior to admission patient diagnosed with prostatitis, treated with Cipro for MSSA.  1 week ago, patient presented to outside hospital with atrial fibrillation, sepsis symptomology, found to have MSSA bacteremia, iliopsoas and prostatic abscesses.     1/13: Transferred to [place], Urology and IR consulted 1/14: To the OR for prostate abscess treatment     Assessment and Plan: * MSSA bacteremia Culture 1/8 positive at [facility] for MSSA in 2/2 No repeat cultures have been done (I'm uncertain why UNCR documentation says "persistent" bacteremia). TTE negative at [place] seems clearly prostate, given urine culture with also MSSA No new satelite foci known at this time. 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 Louis Meckel - Post op care per Urology  Hypokalemia - Supplement K  Type 2 diabetes mellitus ([place]) Glucoses controlled, A1c >12% - Continue Ss corrections  Severe obstructive sleep apnea - CPAP at night  Osteomyelitis of left foot ([place]) - Post op care per Dr. Rocco Serene   Paroxysmal atrial fibrillation [facility]) New diagnosis at last admission.  CHA2DS2-Vasc 1.   - Defer anticoagulation for now - Monitor on tele - Repeat ECG tomorrow  Normocytic anemia Hgb 10 in setting of recent  orthopedic surgery  Morbid obesity ([place]) BMI 47, unclear if he has malnutrition, consult dietitian  Hypercholesteremia Not on statin          Subjective: Patient feels well post op.  No fever, no confusion, no respiratory symptoms.     Physical Exam: BP 123/81 (BP Location: Right Arm)   Pulse 80   Temp 97.8 F (36.6 C) (Oral)   Resp 18   SpO2 94%   Obese adult male, lying in bed, no acute distress, interactive and appropriate watching television RRR, no murmurs, no peripheral edema Respiratory rate normal, lungs clear without rales or wheezes Abdomen soft without tenderness palpation or guarding Attention normal, affect appropriate, judgment insight appear normal Left great toe dressing appears clean dry and intact     Data Reviewed: Discussed with infectious disease, interventional radiology, and urology Patient metabolic panel shows normal renal function, mild hypokalemia Hemogram shows mild leukocytosis, hemoglobin 10, no change CT abdomen and pelvis reviewed and summarized above    Family Communication: Wife by phone    Disposition: Status is: Inpatient         Author: Edwin Dada, MD 08/18/2022 3:26 PM  For on call review [email or web address].

## 2022-08-18 NOTE — Anesthesia Preprocedure Evaluation (Addendum)
Anesthesia Evaluation  Patient identified by MRN, date of birth, ID band Patient awake    Reviewed: Allergy & Precautions, NPO status , Patient's Chart, lab work & pertinent test results  Airway Mallampati: II  TM Distance: >3 FB Neck ROM: Full    Dental  (+) Dental Advisory Given, Teeth Intact   Pulmonary neg shortness of breath, sleep apnea , neg COPD, neg recent URI   breath sounds clear to auscultation       Cardiovascular hypertension, Pt. on medications (-) angina (-) Past MI and (-) CHF  Rhythm:Regular     Neuro/Psych negative neurological ROS  negative psych ROS   GI/Hepatic negative GI ROS, Neg liver ROS,,,  Endo/Other  diabetes, Poorly Controlled  Morbid obesityLab Results      Component                Value               Date                      HGBA1C                   12.9 (H)            08/17/2022          \   Renal/GU Lab Results      Component                Value               Date                      CREATININE               0.79                08/18/2022           Lab Results      Component                Value               Date                      K                        3.3 (L)             08/18/2022                Musculoskeletal   Abdominal   Peds  Hematology  (+) Blood dyscrasia, anemia Lab Results      Component                Value               Date                      WBC                      17.6 (H)            08/18/2022                HGB                      10.5 (L)  08/18/2022                HCT                      32.9 (L)            08/18/2022                MCV                      83.7                08/18/2022                PLT                      219                 08/18/2022              Anesthesia Other Findings   Reproductive/Obstetrics                             Anesthesia Physical Anesthesia Plan  ASA: 3  Anesthesia  Plan: General   Post-op Pain Management: Ofirmev IV (intra-op)*   Induction: Intravenous and Rapid sequence  PONV Risk Score and Plan: 2 and Ondansetron and Treatment may vary due to age or medical condition  Airway Management Planned: Oral ETT  Additional Equipment: None  Intra-op Plan:   Post-operative Plan: Extubation in OR  Informed Consent: I have reviewed the patients History and Physical, chart, labs and discussed the procedure including the risks, benefits and alternatives for the proposed anesthesia with the patient or authorized representative who has indicated his/her understanding and acceptance.     Dental advisory given  Plan Discussed with: CRNA  Anesthesia Plan Comments:        Anesthesia Quick Evaluation

## 2022-08-18 NOTE — Op Note (Signed)
Preoperative diagnosis:  Prostatic abscess  Postoperative diagnosis:  Same  Procedure: TURP, prostate abscess unroofing  Surgeon: Ardis Hughs, MD  Anesthesia: General  Complications: None  Intraoperative findings:  # 1: The abscess was bilateral.  I was able to get into a both cavities and had extensive release of purulent drainage on both sides.  I ensured that this area was widely patent and able to drain freely. 2.:  The bladder was otherwise unremarkable with no significant abnormalities, orthotopic ureteral orifice ease. 3.:  A 24 French three-way Foley catheter was left in place on continuous bladder irrigation.  EBL: approx 100cc  Specimens: prostate chips, urine culture  Indication: William [name] is a 62 y.o. patient with refractory prostatitis that was not improving with oral antibiotics and conservative management.  After reviewing the management options for treatment, he elected to proceed with the above surgical procedure(s). We have discussed the potential benefits and risks of the procedure, side effects of the proposed treatment, the likelihood of the patient achieving the goals of the procedure, and any potential problems that might occur during the procedure or recuperation. Informed consent has been obtained.  Description of procedure:  The patient was taken to the operating room and general anesthesia was induced.  The patient was placed in the dorsal lithotomy position, prepped and draped in the usual sterile fashion, and preoperative antibiotics were administered. A preoperative time-out was performed.   21 French 30 degree cystoscope was gently passed through the patient's urethra into the bladder under visual guidance.  Cystoscopy was performed with the above findings.  I then exchanged the 21 French sheath for the 26 French resectoscope sheath and then passed then using the visual obturator.  I exchanged the obturator for the loop element device and  performed a TURP in the standard fashion resect being the lateral lobes until I reached the abscess cavity.  I then extended the opening the length of the prostate bilaterally.  There was purulent drainage.  I subsequently evacuated the chips and sent those to pathology for further evaluation.  I also sent a urine culture.  A 24 French three-way Foley catheter was then placed in the patient's bladder and the patient was placed on continuous bladder irrigation.  He subsequently extubated and returned to PACU in stable condition.   Ardis Hughs, M.D.

## 2022-08-18 NOTE — Assessment & Plan Note (Addendum)
BMI 47, unclear if he has malnutrition, consult dietitian

## 2022-08-18 NOTE — Assessment & Plan Note (Addendum)
Culture 1/8 positive at [facility] for MSSA in 2/2 No repeat cultures have been done (I'm uncertain why UNCR documentation says "persistent" bacteremia). TTE negative at [place] seems clearly prostate, given urine culture with also MSSA No new satelite foci known at this time. - Continue Vancomycin and Rocephin - Consult ID, appreciate expertise - Source control with prostate I&D today and psoas aspiration by IR tomorrow

## 2022-08-18 NOTE — Progress Notes (Signed)
The patient is status post transurethral prostate abscess unroofing.  I expressed a lot of purulent exudate from both lateral lobes as well as the posterior prostate.  I suspect he will start to feel better.  He is now on continuous bladder irrigation, we should build to wean this to off over the the next 6 to 12 hours.

## 2022-08-18 NOTE — Assessment & Plan Note (Signed)
Not on statin 

## 2022-08-18 NOTE — Progress Notes (Signed)
Pt refused cpap for tonight. He said he never worn anything like that before and do not wish to start it.

## 2022-08-18 NOTE — Assessment & Plan Note (Signed)
New diagnosis at last admission.  CHA2DS2-Vasc 1.   - Defer anticoagulation for now - Monitor on tele - Repeat ECG tomorrow

## 2022-08-18 NOTE — Transfer of Care (Signed)
Immediate Anesthesia Transfer of Care Note  Patient: William [name]  Procedure(s) Performed: Procedure(s): PROSTATE ABSCESS UNROOFING, TRANSURETHRAL RESECTION OF THE PROSTATE (TURP) (N/A)  Patient Location: PACU  Anesthesia Type:General  Level of Consciousness:  sedated, patient cooperative and responds to stimulation  Airway & Oxygen Therapy:Patient Spontanous Breathing and Patient connected to face mask oxgen  Post-op Assessment:  Report given to PACU RN and Post -op Vital signs reviewed and stable  Post vital signs:  Reviewed and stable  Last Vitals:  Vitals:   08/18/22 0528 08/18/22 1000  BP: 131/69 (!) 103/97  Pulse: 78   Resp: 18 17  Temp: 36.7 C   SpO2: 88% 82%    Complications: No apparent anesthesia complications

## 2022-08-18 NOTE — Consult Note (Signed)
Urology Inpatient Progress Report  Prostatic abscess [N41.2]  Procedure(s): PROSTATE ABSCESS UNROOFING, TRANSURETHRAL RESECTION OF THE PROSTATE (TURP)  Day of Surgery   Intv/Subj: No acute events overnight. Patient is without complaint - feels better w/ less back pain CT scan completed last night - demonstrates enlarging prostatic abscess   Principal Problem:   MSSA bacteremia Active Problems:   Hypercholesteremia   Morbid obesity (HCC)   Prostatic abscess   Protein-calorie malnutrition, severe (HCC)   Normocytic anemia   Paroxysmal atrial fibrillation (HCC)   Osteomyelitis of left foot (HCC)   Severe obstructive sleep apnea   Type 2 diabetes mellitus (HCC)   Iliopsoas abscess (HCC)   Hypokalemia  Current Facility-Administered Medications  Medication Dose Route Frequency Provider Last Rate Last Admin   acetaminophen (TYLENOL) tablet 650 mg  650 mg Oral Q6H PRN Howerter, Justin B, DO       Or   acetaminophen (TYLENOL) suppository 650 mg  650 mg Rectal Q6H PRN Howerter, Justin B, DO       bisacodyl (DULCOLAX) suppository 10 mg  10 mg Rectal Daily PRN Reubin Milan, MD       cefTRIAXone (ROCEPHIN) 2 g in sodium chloride 0.9 % 100 mL IVPB  2 g Intravenous Q24H Reubin Milan, MD 200 mL/hr at 08/18/22 0143 2 g at 08/18/22 0143   Chlorhexidine Gluconate Cloth 2 % PADS 6 each  6 each Topical Daily Reubin Milan, MD   6 each at 08/17/22 1400   fentaNYL (SUBLIMAZE) injection 50 mcg  50 mcg Intravenous Q2H PRN Howerter, Justin B, DO   50 mcg at 08/18/22 0520   insulin aspart (novoLOG) injection 0-15 Units  0-15 Units Subcutaneous TID WC Reubin Milan, MD   3 Units at 08/17/22 1805   naloxone [facility]) injection 0.4 mg  0.4 mg Intravenous PRN Howerter, Justin B, DO       oxyCODONE (Oxy IR/ROXICODONE) immediate release tablet 5 mg  5 mg Oral Q4H PRN Howerter, Justin B, DO   5 mg at 08/18/22 0745   senna-docusate (Senokot-S) tablet 2 tablet  2 tablet Oral BID  Reubin Milan, MD   2 tablet at 08/17/22 2147   vancomycin (VANCOREADY) IVPB 1750 mg/350 mL  1,750 mg Intravenous Q12H Angela Adam, RPH 175 mL/hr at 08/18/22 0229 1,750 mg at 08/18/22 0229     Objective: Vital: Vitals:   08/17/22 0819 08/17/22 1155 08/17/22 2025 08/18/22 0528  BP: 122/79 (!) 145/83 111/63 131/69  Pulse: 76 60 76 78  Resp: 18 18 18 18   Temp: 98.5 F (36.9 C) 98.6 F (37 C) 99.1 F (37.3 C) 98.1 F (36.7 C)  TempSrc: Oral  Oral Oral  SpO2: 95% 99% 94% 96%   I/Os: I/O last 3 completed shifts: In: 958.2 [P.O.:720; IV Piggyback:238.2] Out: 0017 [Urine:4050]  Physical Exam:  General: Patient is in no apparent distress, morbidly obese CV: RRR Lungs: Normal respiratory effort, chest expands symmetrically. Foley: clear urine  Ext: left foot wrapped.  Lab Results: Recent Labs    08/17/22 0115 08/18/22 0511  WBC 18.5* 17.6*  HGB 10.8* 10.5*  HCT 34.1* 32.9*   Recent Labs    08/17/22 0115 08/18/22 0511  NA 136 136  K 3.1* 3.3*  CL 104 104  CO2 26 25  GLUCOSE 99 106*  BUN 15 12  CREATININE 0.95 0.79  CALCIUM 7.4* 7.5*   Recent Labs    08/17/22 0115  INR 1.4*   No results for input(s): "  LABURIN" in the last 72 hours. Results for orders placed or performed during the hospital encounter of 08/16/22  Culture, blood (Routine X 2) w Reflex to ID Panel     Status: None (Preliminary result)   Collection Time: 08/17/22  1:15 AM   Specimen: BLOOD  Result Value Ref Range Status   Specimen Description   Final    BLOOD BLOOD LEFT HAND Performed at [facility] [address] [place], [place] [address]    Special Requests   Final    BOTTLES DRAWN AEROBIC ONLY SITE NOT SPECIFIED Performed at [facility] [address] [place], [place] [address]    Culture   Final    NO GROWTH 1 DAY Performed at [facility], [place] [address] [place], [place] [address]    Report Status PENDING  Incomplete   Culture, blood (Routine X 2) w Reflex to ID Panel     Status: None (Preliminary result)   Collection Time: 08/17/22  1:15 AM   Specimen: BLOOD  Result Value Ref Range Status   Specimen Description   Final    BLOOD BLOOD RIGHT HAND Performed at [facility] [address] [place], [place] [address]    Special Requests   Final    BOTTLES DRAWN AEROBIC ONLY Blood Culture adequate volume Performed at [facility] [address] [place], [place] [address]    Culture   Final    NO GROWTH 1 DAY Performed at [facility], [place] [address], [place], [place] [address]    Report Status PENDING  Incomplete    Studies/Results: CT scan done overnight demonstrating enlarging prostatic abscess.  I reviewed these images and discussed with patient  Assessment: Procedure(s): PROSTATE ABSCESS UNROOFING, TRANSURETHRAL RESECTION OF THE PROSTATE (TURP), Day of Surgery    Persistent elevation in WBC, CT demonstrates enlarging abscess in prostate.  Plan: To OR for unroofing of prostate abscess.  Discussed with the patient and he is willing  to proceed.  Discussed risk and benefits w/ patient and wife.  Recommend discussing psoas abscess drainage with IR.  Wound care consult for left great toe.  ID  consult.   Louis Meckel, MD Urology 08/18/2022, 8:33 AM

## 2022-08-19 ENCOUNTER — Encounter (HOSPITAL_COMMUNITY): Payer: Self-pay | Admitting: Urology

## 2022-08-19 ENCOUNTER — Inpatient Hospital Stay (HOSPITAL_COMMUNITY): Payer: Commercial Managed Care - PPO

## 2022-08-19 DIAGNOSIS — E0590 Thyrotoxicosis, unspecified without thyrotoxic crisis or storm: Secondary | ICD-10-CM

## 2022-08-19 DIAGNOSIS — K6812 Psoas muscle abscess: Secondary | ICD-10-CM | POA: Diagnosis not present

## 2022-08-19 DIAGNOSIS — R7881 Bacteremia: Secondary | ICD-10-CM | POA: Diagnosis not present

## 2022-08-19 DIAGNOSIS — M86072 Acute hematogenous osteomyelitis, left ankle and foot: Secondary | ICD-10-CM

## 2022-08-19 DIAGNOSIS — E7800 Pure hypercholesterolemia, unspecified: Secondary | ICD-10-CM

## 2022-08-19 DIAGNOSIS — N412 Abscess of prostate: Secondary | ICD-10-CM | POA: Diagnosis not present

## 2022-08-19 LAB — COMPREHENSIVE METABOLIC PANEL
ALT: 30 U/L (ref 0–44)
AST: 28 U/L (ref 15–41)
Albumin: 1.7 g/dL — ABNORMAL LOW (ref 3.5–5.0)
Alkaline Phosphatase: 352 U/L — ABNORMAL HIGH (ref 38–126)
Anion gap: 7 (ref 5–15)
BUN: 12 mg/dL (ref 6–20)
CO2: 27 mmol/L (ref 22–32)
Calcium: 7.6 mg/dL — ABNORMAL LOW (ref 8.9–10.3)
Chloride: 102 mmol/L (ref 98–111)
Creatinine, Ser: 0.89 mg/dL (ref 0.61–1.24)
GFR, Estimated: >60 mL/min (ref >60)
Glucose, Bld: 195 mg/dL — ABNORMAL HIGH (ref 70–99)
Potassium: 3.6 mmol/L (ref 3.5–5.1)
Sodium: 136 mmol/L (ref 135–145)
Total Bilirubin: 0.5 mg/dL (ref 0.3–1.2)
Total Protein: 5.6 g/dL — ABNORMAL LOW (ref 6.5–8.1)

## 2022-08-19 LAB — CBC
HCT: 34.1 % — ABNORMAL LOW (ref 39.0–52.0)
Hemoglobin: 10.5 g/dL — ABNORMAL LOW (ref 13.0–17.0)
MCH: 26.4 pg (ref 26.0–34.0)
MCHC: 30.8 g/dL (ref 30.0–36.0)
MCV: 85.9 fL (ref 80.0–100.0)
Platelets: 231 10*3/uL (ref 150–400)
RBC: 3.97 MIL/uL — ABNORMAL LOW (ref 4.22–5.81)
RDW: 15 % (ref 11.5–15.5)
WBC: 17.8 10*3/uL — ABNORMAL HIGH (ref 4.0–10.5)
nRBC: 0 % (ref 0.0–0.2)

## 2022-08-19 LAB — GLUCOSE, CAPILLARY
Glucose-Capillary: 213 mg/dL — ABNORMAL HIGH (ref 70–99)
Glucose-Capillary: 187 mg/dL — ABNORMAL HIGH (ref 70–99)
Glucose-Capillary: 142 mg/dL — ABNORMAL HIGH (ref 70–99)
Glucose-Capillary: 239 mg/dL — ABNORMAL HIGH (ref 70–99)

## 2022-08-19 MED ORDER — FUROSEMIDE 10 MG/ML IJ SOLN
40.0000 mg | Freq: Once | INTRAMUSCULAR | Status: AC
  Administered 2022-08-19: 40 mg via INTRAVENOUS
  Filled 2022-08-19: qty 4

## 2022-08-19 MED ORDER — POTASSIUM CHLORIDE CRYS ER 20 MEQ PO TBCR
40.0000 meq | EXTENDED_RELEASE_TABLET | Freq: Once | ORAL | Status: AC
  Administered 2022-08-19: 40 meq via ORAL
  Filled 2022-08-19: qty 2

## 2022-08-19 NOTE — Progress Notes (Signed)
Patient declines nocturnal CPAP 

## 2022-08-19 NOTE — Evaluation (Signed)
Physical Therapy Evaluation Patient Details Name: William [name] MRN: [id number] DOB: [birth year] Today's Date: 08/19/2022  History of Present Illness  Patient is 62 yo male admitted 08/16/22  for evaluation of persistent left-sided low back pain and pain along the suprapubic region , treated for dysuria recently, S/P PROSTATE ABSCESS UNROOFING, TRANSURETHRAL RESECTION OF THE PROSTATE. PMH: right great toe amputation,DM,HTN  Clinical Impression  Pt admitted with above diagnosis.  Pt currently with functional limitations due to the deficits listed below (see PT Problem List). Pt will benefit from skilled PT to increase their independence and safety with mobility to allow discharge to the venue listed below.     Then  patient is eager to mobilize.  Patient mobility limited bythe bariatric bed  and body habitus. The patient will benefit from a Kregg bariatric tilt bed to faciiltate safe mobility, to stand and ambulate. Will discuss with nursing and order as indicated.  Patient presents with LE pain and weakness. Continue PT for mobility.      Recommendations for follow up therapy are one component of a multi-disciplinary discharge planning process, led by the attending physician.  Recommendations may be updated based on patient status, additional functional criteria and insurance authorization.  Follow Up Recommendations Skilled nursing-short term rehab (<3 hours/day) Can patient physically be transported by private vehicle: No    Assistance Recommended at Discharge Frequent or constant Supervision/Assistance  Patient can return home with the following  Two people to help with walking and/or transfers;A lot of help with bathing/dressing/bathroom;Assist for transportation;Help with stairs or ramp for entrance    Equipment Recommendations Rolling walker (2 wheels)  Recommendations for Other Services       Functional Status Assessment Patient has had a recent decline in their functional status  and demonstrates the ability to make significant improvements in function in a reasonable and predictable amount of time.     Precautions / Restrictions Precautions Precautions: Fall Precaution Comments: bladder irigation, bariatric Restrictions Weight Bearing Restrictions: No      Mobility  Bed Mobility               General bed mobility comments: very limited by body habitus, rails are down to guive space, really unable to roll, not safe to attempt sitting on the bari bed /air mattress.    Transfers                   General transfer comment: NT    Ambulation/Gait                  Stairs            Wheelchair Mobility    Modified Rankin (Stroke Patients Only)       Balance                                             Pertinent Vitals/Pain Pain Assessment Pain Assessment: Faces Pain Location: both legs when moving Pain Descriptors / Indicators: Aching, Discomfort Pain Intervention(s): Monitored during session, Limited activity within patient's tolerance    Home Living Family/patient expects to be discharged to:: Private residence Living Arrangements: Spouse/significant other;Children Available Help at Discharge: Family;Available PRN/intermittently Type of Home: House Home Access: Stairs to enter   Entrance Stairs-Number of Steps: 1   Home Layout: One level Home Equipment: None      Prior Function Prior Level of  Function : Independent/Modified Independent             Mobility Comments: works for [employer]        Extremity/Trunk Assessment   Upper Extremity Assessment Upper Extremity Assessment: Overall WFL for tasks assessed    Lower Extremity Assessment Lower Extremity Assessment: RLE deficits/detail;LLE deficits/detail RLE Deficits / Details: hip flex 3/5, dorsiflex/plantar 3/5, kneen ext 3+ RLE Sensation: history of peripheral neuropathy LLE Deficits /  Details: dress on foot, Gr. toe amp,grossly 3/kneen extension,  2+ hip flex, minimal ankle dorsi/plantar flex LLE Sensation: history of peripheral neuropathy       Communication   Communication: No difficulties  Cognition Arousal/Alertness: Awake/alert Behavior During Therapy: WFL for tasks assessed/performed Overall Cognitive Status: Within Functional Limits for tasks assessed                                          General Comments General comments (skin integrity, edema, etc.): NT    Exercises     Assessment/Plan    PT Assessment Patient needs continued PT services  PT Problem List Decreased strength;Decreased mobility;Decreased range of motion;Decreased knowledge of precautions;Obesity;Decreased activity tolerance;Pain       PT Treatment Interventions DME instruction;Therapeutic activities;Therapeutic exercise;Patient/family education;Functional mobility training;Gait training    PT Goals (Current goals can be found in the Care Plan section)  Acute Rehab PT Goals Patient Stated Goal: to get out of bed PT Goal Formulation: With patient Time For Goal Achievement: 09/02/22 Potential to Achieve Goals: Good    Frequency Min 3X/week     Co-evaluation               AM-PAC PT "6 Clicks" Mobility  Outcome Measure Help needed turning from your back to your side while in a flat bed without using bedrails?: Total Help needed moving from lying on your back to sitting on the side of a flat bed without using bedrails?: Total Help needed moving to and from a bed to a chair (including a wheelchair)?: Total Help needed standing up from a chair using your arms (e.g., wheelchair or bedside chair)?: Total Help needed to walk in hospital room?: Total Help needed climbing 3-5 steps with a railing? : Total 6 Click Score: 6    End of Session   Activity Tolerance: Treatment limited secondary to medical complications (Comment) (bariatric , on aior mattress and  lying flat) Patient left: in bed;with nursing/sitter in room;with call bell/phone within reach Nurse Communication: Mobility status (need for Kregg tilt bed) PT Visit Diagnosis: Unsteadiness on feet (R26.81);Difficulty in walking, not elsewhere classified (R26.2);Pain Pain - Right/Left: Left    Time: 9371-6967 PT Time Calculation (min) (ACUTE ONLY): 15 min   Charges:   PT Evaluation $PT Eval Low Complexity: 1 Low          Tresa Endo PT Acute Rehabilitation Services Office [phone] Weekend [phone]   Claretha Cooper 08/19/2022, 3:13 PM

## 2022-08-19 NOTE — Progress Notes (Addendum)
Nutrition Brief Note  RD consulted for assessment of nutritional status/ needs related to obesity.   Wt Readings from Last 15 Encounters:  08/09/22 (!) 176.4 kg  08/06/22 (!) 174.2 kg  07/20/21 (!) 144 kg  03/22/19 (!) 190.5 kg  05/23/16 (!) 218.2 kg   Pt with DM, HTN, MO, hyperthyroidism as well as recent osteomyelitis of the left great toe status post amputation, who was transferred from outside hospital for prostatic abscess.  Pt admitted with MSSA bacteremia and Iliopsoas abscess.   1/14- s/p TURP, prostate abscess unroofing   Reviewed I/O's: -5.1 L x 24 hours and -8.2 L since admission  UOP: 12.8 L x 24 hours  Pt unavailable at time of visit. Attempted to speak with pt via call to hospital room phone, however, unable to reach. RD unable to obtain further nutrition-related history or complete nutrition-focused physical exam at this time.    Per IR notes, abscess too small for drainage.   Current diet order is NPO (previously heart healthy, carb modifed), patient is consuming approximately 100% of meals at this time. Labs and medications reviewed.   Obesity is a complex, chronic medical condition that is optimally managed by a multidisciplinary care team. Weight loss is not an ideal goal for an acute inpatient hospitalization. However, if further work-up for obesity is warranted, consider outpatient referral to outpatient bariatric service and/or [place]'s Nutrition and Diabetes Education Services.    No nutrition interventions warranted at this time. If nutrition issues arise, please consult RD.   Loistine Chance, RD, LDN, [place] Registered Dietitian II Certified Diabetes Care and Education Specialist Please refer to [facility] for RD and/or RD on-call/weekend/after hours pager

## 2022-08-19 NOTE — Progress Notes (Addendum)
Progress Note   Patient: William [name] TOI:[id number] DOB: 07-05-1961 DOA: 08/16/2022     3 DOS: the patient was seen and examined on 08/19/2022 at 11:57 AM and 1:55PM      Brief hospital course: Mr. William [name] is a 62 y.o. M with DM, HTN, MO, hyperthyroidism as well as recent osteomyelitis of the left great toe status post amputation, who was transferred from outside hospital for prostatic abscess.  2 weeks prior to admission patient diagnosed with prostatitis, treated with Cipro for MSSA.  1 week ago, patient presented to outside hospital with atrial fibrillation, sepsis symptomology, found to have MSSA bacteremia, iliopsoas and prostatic abscesses.     1/13: Transferred to [place], Urology and IR consulted 1/14: To the OR for prostate abscess treatment     Assessment and Plan: * MSSA bacteremia 1/8 BCx positive at [facility] for MSSA in 2/2 1/13 BCx drawn here, NGTD  TTE negative at OSH  Only known satellites of infection are prostate and psoas, see below - Consult ID, appreciate expertise - CONtinue cefazolin, plan for 6 weeks - Remove IJ today    Possible psoas muscle infection CT from 1/13 shows possible abscess.  IR reviewed, do not feel this is a discrete fluid collection and cannot drain.   Prostatic abscess S/p TURP, prostate abscess unroofing 1/14 by Dr. Louis [name] - Post op care per Urology  Osteomyelitis of left foot [name]) S/p LEFT great toe partial amputation on 1/11 by Dr. Rocco [name] Discussed with Dr. Rocco [name], surgeon 1/15 by phone Personally inspected toe today, looks good, no drainage, dehiscence, see below - Change dressing daily -- xeroform, wrapped in gauze, wrapped in ACE wrap - WBAT in post op shoe - After two weeks (so around Jan 25) stop Xeroform in dressing and use adaptic - Leave sutures in place for 1 month (remove Feb 11) - Follow upw with Dr. Rocco [name] in [place] within 1 month, preferably within a week of discharge to home or  SNF    Hyperthyroidism Abnormal thyroid testing at OSH and briefly on PTU there. - Repeat TSH, fT4, T3  Hypokalemia - Supplement K  Type 2 diabetes mellitus (HCC) Glucoses controlled, A1c >12% - Continue Ss corrections  Severe obstructive sleep apnea - CPAP at night  Paroxysmal atrial fibrillation ([place]) New diagnosis at last admission.  CHA2DS2-Vasc 1.   - Defer anticoagulation for now - Monitor on tele - Repeat ECG tomorrow  Normocytic anemia Hgb 10 in setting of recent orthopedic surgery  Morbid obesity ([place]) BMI 47, unclear if he has malnutrition, consult dietitian  Hypercholesteremia Not on statin  Interstitial change on chest x-ray - Lasix once        Subjective: Patient is doing okay, no confusion, fever, chest pain.  No dyspnea, no swelling.     Physical Exam: BP (!) 145/87 (BP Location: Left Arm)   Pulse 86   Temp 98.3 F (36.8 C) (Oral)   Resp 18   SpO2 91%   Obese adult male, lying in bed, no acute distress RRR, no murmurs, no pitting extremities without edema would be hard to detect in his body habitus Respiratory effort seems normal, lung sounds diminished but I do not appreciate rales or wheezes Abdomen without grimace to palpation The toe looks well, see image Attention normal, affect appropriate, judgment and insight appear normal      Data Reviewed: Discussed with interventional radiology urology Basic metabolic panel shows resolved hypokalemia, stable renal function, low albumin Hemogram shows persistent leukocytosis, stable anemia, normal platelets  Family Communication: None present    Disposition: Status is: Inpatient         Author: Edwin Dada, MD 08/19/2022 4:10 PM  For on call review [email or web address].

## 2022-08-19 NOTE — Consult Note (Signed)
Urology Inpatient Progress Report  Prostatic abscess [N41.2]  Procedure(s): PROSTATE ABSCESS UNROOFING, TRANSURETHRAL RESECTION OF THE PROSTATE (TURP)  1 Day Post-Op   Intv/Subj: No acute events overnight. S/p TURP/prostate abscess unroofing Feels much better this AM with far less pain. Still having some left sided flank pain No fevers. NPO pMN for IR procedure   Principal Problem:   MSSA bacteremia Active Problems:   Hypercholesteremia   Morbid obesity ([place])   Prostatic abscess   Normocytic anemia   Paroxysmal atrial fibrillation (HCC)   Osteomyelitis of left foot (HCC)   Severe obstructive sleep apnea   Type 2 diabetes mellitus ([place])   Psoas abscess (HCC)   Hypokalemia  Current Facility-Administered Medications  Medication Dose Route Frequency Provider Last Rate Last Admin   acetaminophen (TYLENOL) tablet 650 mg  650 mg Oral Q6H PRN Ardis Hughs, MD       Or   acetaminophen (TYLENOL) suppository 650 mg  650 mg Rectal Q6H PRN Ardis Hughs, MD       bisacodyl (DULCOLAX) suppository 10 mg  10 mg Rectal Daily PRN Ardis Hughs, MD       ceFAZolin (ANCEF) IVPB 2g/100 mL premix  2 g Intravenous Q8H Vu, Trung T, MD 200 mL/hr at 08/19/22 0536 2 g at 08/19/22 0536   Chlorhexidine Gluconate Cloth 2 % PADS 6 each  6 each Topical Daily Ardis Hughs, MD   6 each at 08/18/22 0835   fentaNYL (SUBLIMAZE) injection 50 mcg  50 mcg Intravenous Q2H PRN Ardis Hughs, MD   50 mcg at 08/18/22 0520   insulin aspart (novoLOG) injection 0-15 Units  0-15 Units Subcutaneous TID WC Ardis Hughs, MD   3 Units at 08/17/22 1805   naloxone [facility]) injection 0.4 mg  0.4 mg Intravenous PRN Ardis Hughs, MD       oxyCODONE (Oxy IR/ROXICODONE) immediate release tablet 5 mg  5 mg Oral Q4H PRN Ardis Hughs, MD   5 mg at 08/19/22 0107   senna-docusate (Senokot-S) tablet 2 tablet  2 tablet Oral BID Ardis Hughs, MD   2 tablet at 08/17/22 2147    sodium chloride irrigation 0.9 % 3,000 mL  3,000 mL Irrigation Continuous Ardis Hughs, MD   3,000 mL at 08/19/22 0553   tamsulosin (FLOMAX) capsule 0.4 mg  0.4 mg Oral Daily Edwin Dada, MD         Objective: Vital: Vitals:   08/18/22 1600 08/18/22 1946 08/18/22 2011 08/19/22 0418  BP:  109/63  126/78  Pulse:  (!) 108 100 86  Resp:  18 16 18   Temp: 98.9 F (37.2 C) 100.2 F (37.9 C) 98.4 F (36.9 C) 97.7 F (36.5 C)  TempSrc:  Oral Oral Oral  SpO2:  91% 92% 91%   I/Os: I/O last 3 completed shifts: In: 58 [P.O.:600; I.V.:600; Other:6900] Out: 14600 [Urine:14600]  Physical Exam:  General: Patient is in no apparent distress, morbidly obese CV: RRR Lungs: Normal respiratory effort, chest expands symmetrically. Foley: blood tinged on slow CBI Ext: left foot wrapped.  Lab Results: Recent Labs    08/17/22 0115 08/18/22 0511  WBC 18.5* 17.6*  HGB 10.8* 10.5*  HCT 34.1* 32.9*   Recent Labs    08/17/22 0115 08/18/22 0511  NA 136 136  K 3.1* 3.3*  CL 104 104  CO2 26 25  GLUCOSE 99 106*  BUN 15 12  CREATININE 0.95 0.79  CALCIUM 7.4* 7.5*   Recent Labs  08/17/22 0115  INR 1.4*   No results for input(s): "LABURIN" in the last 72 hours. Results for orders placed or performed during the hospital encounter of 08/16/22  Culture, blood (Routine X 2) w Reflex to ID Panel     Status: None (Preliminary result)   Collection Time: 08/17/22  1:15 AM   Specimen: BLOOD  Result Value Ref Range Status   Specimen Description   Final    BLOOD BLOOD LEFT HAND Performed at [facility] [address] [place], [place] [address]    Special Requests   Final    BOTTLES DRAWN AEROBIC ONLY SITE NOT SPECIFIED Performed at [facility] [address] [place], [place] [address]    Culture   Final    NO GROWTH 1 DAY Performed at [facility], [place] [address] [place], [place] [address]    Report Status PENDING   Incomplete  Culture, blood (Routine X 2) w Reflex to ID Panel     Status: None (Preliminary result)   Collection Time: 08/17/22  1:15 AM   Specimen: BLOOD  Result Value Ref Range Status   Specimen Description   Final    BLOOD BLOOD RIGHT HAND Performed at [facility] [address], [place], [place] [address]    Special Requests   Final    BOTTLES DRAWN AEROBIC ONLY Blood Culture adequate volume Performed at [facility] [address], [place], [place] [address]    Culture   Final    NO GROWTH 1 DAY Performed at [facility], [place] [address], [place], [place] [address]    Report Status PENDING  Incomplete    Studies/Results: CT scan done overnight demonstrating enlarging prostatic abscess.  I reviewed these images and discussed with patient  Assessment: Procedure(s): PROSTATE ABSCESS UNROOFING, TRANSURETHRAL RESECTION OF THE PROSTATE (TURP), 1 Day Post-Op    Feeling better after abscess drainage of prostate.  Plan: Stopped CBI, keep close eye on urine to ensure it doesn't get thick and form clots. Will keep foley a couple days and plan to remove Wednesday if things are otherwise going well. Urine culture sent from OR. Will continue to follow.  Louis Meckel, MD Urology 08/19/2022, 9:16 AM

## 2022-08-19 NOTE — Assessment & Plan Note (Signed)
Abnormal thyroid testing at OSH and briefly on PTU there. - Repeat TSH, fT4, T3

## 2022-08-19 NOTE — Progress Notes (Signed)
Order received to discontinue CVC. Initially, there was question on if line was placed with interventional radiology as line was placed by an outside facility.  Dr. Loleta Books confirmed facility where William [name] had the line placed does not have IR capability. CXR read by IR here also supports that line is not tunneled. This RN to bedside to remove. 19cm removed, line intact.   Insertion point covered with Vaseline gauze with pressure dressing.  Primary RN aware.

## 2022-08-19 NOTE — Progress Notes (Signed)
Patient ID: William [name], male   DOB: May 04, 1961, 62 y.o.   MRN: [id number] Request received for CT-guided aspiration/possible drainage of psoas abscess in patient.  Latest imaging studies have been reviewed by Dr. Kathlene Cote.  Per Dr. Kathlene Cote the areas of concern are too small and not in a location which is amenable to safely aspirate /drain at this time.  Recommend continued IV antibiotics and follow-up CT when appropriate.

## 2022-08-20 ENCOUNTER — Other Ambulatory Visit: Payer: Self-pay

## 2022-08-20 DIAGNOSIS — M86072 Acute hematogenous osteomyelitis, left ankle and foot: Secondary | ICD-10-CM | POA: Diagnosis not present

## 2022-08-20 DIAGNOSIS — N412 Abscess of prostate: Secondary | ICD-10-CM | POA: Diagnosis not present

## 2022-08-20 DIAGNOSIS — K6812 Psoas muscle abscess: Secondary | ICD-10-CM | POA: Diagnosis not present

## 2022-08-20 DIAGNOSIS — R7881 Bacteremia: Secondary | ICD-10-CM | POA: Diagnosis not present

## 2022-08-20 DIAGNOSIS — E871 Hypo-osmolality and hyponatremia: Secondary | ICD-10-CM | POA: Insufficient documentation

## 2022-08-20 DIAGNOSIS — B9561 Methicillin susceptible Staphylococcus aureus infection as the cause of diseases classified elsewhere: Secondary | ICD-10-CM | POA: Diagnosis not present

## 2022-08-20 LAB — COMPREHENSIVE METABOLIC PANEL
ALT: 26 U/L (ref 0–44)
AST: 29 U/L (ref 15–41)
Albumin: 1.7 g/dL — ABNORMAL LOW (ref 3.5–5.0)
Alkaline Phosphatase: 322 U/L — ABNORMAL HIGH (ref 38–126)
Anion gap: 7 (ref 5–15)
BUN: 11 mg/dL (ref 6–20)
CO2: 27 mmol/L (ref 22–32)
Calcium: 7.4 mg/dL — ABNORMAL LOW (ref 8.9–10.3)
Chloride: 99 mmol/L (ref 98–111)
Creatinine, Ser: 0.78 mg/dL (ref 0.61–1.24)
GFR, Estimated: >60 mL/min (ref >60)
Glucose, Bld: 227 mg/dL — ABNORMAL HIGH (ref 70–99)
Potassium: 3.1 mmol/L — ABNORMAL LOW (ref 3.5–5.1)
Sodium: 133 mmol/L — ABNORMAL LOW (ref 135–145)
Total Bilirubin: 0.7 mg/dL (ref 0.3–1.2)
Total Protein: 5.3 g/dL — ABNORMAL LOW (ref 6.5–8.1)

## 2022-08-20 LAB — GLUCOSE, CAPILLARY
Glucose-Capillary: 217 mg/dL — ABNORMAL HIGH (ref 70–99)
Glucose-Capillary: 219 mg/dL — ABNORMAL HIGH (ref 70–99)
Glucose-Capillary: 193 mg/dL — ABNORMAL HIGH (ref 70–99)
Glucose-Capillary: 214 mg/dL — ABNORMAL HIGH (ref 70–99)

## 2022-08-20 LAB — CBC
HCT: 33.3 % — ABNORMAL LOW (ref 39.0–52.0)
Hemoglobin: 10.5 g/dL — ABNORMAL LOW (ref 13.0–17.0)
MCH: 26.2 pg (ref 26.0–34.0)
MCHC: 31.5 g/dL (ref 30.0–36.0)
MCV: 83 fL (ref 80.0–100.0)
Platelets: 252 10*3/uL (ref 150–400)
RBC: 4.01 MIL/uL — ABNORMAL LOW (ref 4.22–5.81)
RDW: 14.8 % (ref 11.5–15.5)
WBC: 14.6 10*3/uL — ABNORMAL HIGH (ref 4.0–10.5)
nRBC: 0 % (ref 0.0–0.2)

## 2022-08-20 LAB — URINE CULTURE: Culture: >=100000 — AB

## 2022-08-20 LAB — TSH: TSH: 0.828 u[IU]/mL (ref 0.350–4.500)

## 2022-08-20 LAB — SURGICAL PATHOLOGY

## 2022-08-20 LAB — T4, FREE: Free T4: 1.67 ng/dL — ABNORMAL HIGH (ref 0.61–1.12)

## 2022-08-20 MED ORDER — POTASSIUM CHLORIDE CRYS ER 20 MEQ PO TBCR
40.0000 meq | EXTENDED_RELEASE_TABLET | Freq: Two times a day (BID) | ORAL | Status: AC
  Administered 2022-08-20 (×2): 40 meq via ORAL
  Filled 2022-08-20 (×2): qty 2

## 2022-08-20 NOTE — Progress Notes (Signed)
[facility] for Infectious Disease  Date of Admission:  08/16/2022     Principal Problem:   MSSA bacteremia Active Problems:   Hypercholesteremia   Morbid obesity ([place])   Prostatic abscess   Normocytic anemia   Paroxysmal atrial fibrillation (HCC)   Osteomyelitis of left foot (HCC)   Severe obstructive sleep apnea   Type 2 diabetes mellitus (HCC)   Possible psoas muscle infection   Hypokalemia   Hyperthyroidism          Assessment: 4 YM with afib, hypothyroidism, DM2, admitted 1/08 to [facility] with severe sepsis found to have disseminated mssa infection and left great toe OM, complicated by dka, transferred [facility] 1[facility]12 for further management of prostatic abscess. # Disseminated MSSA bacteremia with  psoas abscess, prostatic abscess and lungs #Left great toe OM SP amputation on 1/11 - Blood cultures at [facility] ER on 1/8 grew 2/2 MSSA, blood cultures on 1/13 at our facility with no growth.   -CT at [facility] ER showed hypodensities in prostate gland measuring 3.5X 2.2 cm, in the right Hemi gland and 4X 3 cm in the left Hemi gland suspicious for prostatic abscess.  Right psoas abscess measuring 3.2X 2.7 cm likely reflecting developing abscess, small pelvic free fluid.  Right middle lobe groundglass opacity, partially imaged. - TTE at [facility] on 1/12 showed mildly thickened aortic leaflets. -Urology following, SP TURP/ptostate abscess unroofing on 1/14, foley placed -IR consulted, areas of concern not amenable to drainage, recc abx and f/u CT -IJ out on 1/15 Recommendations: -Continue cefazolin  -Ok with repeat CT - Anticipate at least 6 weeks antibiotics, will get TEE.  Source of bacteremia was likely the left great toe leading to possible endocarditis metastatic/embolic infection.  Microbiology:   Antibiotics: Cefazolin 1/14- Ceftriaxone 1/12- Vanc 1/12- Cultures: Blood 1/13 ng Urine 1/14 MSSA   SUBJECTIVE: Resting in bed, flank pain better this  afternoon Interval: Afebrile overnight. Wbc 14.6k  Review of Systems: Review of Systems  All other systems reviewed and are negative.    Scheduled Meds:  Chlorhexidine Gluconate Cloth  6 each Topical Daily   insulin aspart  0-15 Units Subcutaneous TID WC   senna-docusate  2 tablet Oral BID   tamsulosin  0.4 mg Oral Daily   Continuous Infusions:   ceFAZolin (ANCEF) IV 2 g (08/20/22 0523)   sodium chloride irrigation     PRN Meds:.acetaminophen **OR** acetaminophen, bisacodyl, fentaNYL (SUBLIMAZE) injection, naLOXone (NARCAN)  injection, oxyCODONE Allergies  Allergen Reactions   Diltiazem Hcl Other (See Comments)    Causes accelerated heart rate    OBJECTIVE: Vitals:   08/19/22 1600 08/19/22 2131 08/20/22 0434 08/20/22 0519  BP:  125/68 (!) 153/88   Pulse:  94 88   Resp:  18 16 20   Temp:  98.1 F (36.7 C) 97.8 F (36.6 C)   TempSrc:  Oral Oral   SpO2: 92% 95% 93%    There is no height or weight on file to calculate BMI.  Physical Exam Constitutional:      General: He is not in acute distress.    Appearance: He is normal weight. He is not toxic-appearing.  HENT:     Head: Normocephalic and atraumatic.     Right Ear: External ear normal.     Left Ear: External ear normal.     Nose: No congestion or rhinorrhea.     Mouth/Throat:     Mouth: Mucous membranes are moist.  Pharynx: Oropharynx is clear.  Eyes:     Extraocular Movements: Extraocular movements intact.     Conjunctiva/sclera: Conjunctivae normal.     Pupils: Pupils are equal, round, and reactive to light.  Cardiovascular:     Rate and Rhythm: Normal rate and regular rhythm.     Heart sounds: No murmur heard.    No friction rub. No gallop.  Pulmonary:     Effort: Pulmonary effort is normal.     Breath sounds: Normal breath sounds.  Abdominal:     General: Abdomen is flat. Bowel sounds are normal.     Palpations: Abdomen is soft.  Musculoskeletal:        General: No swelling. Normal range of  motion.     Cervical back: Normal range of motion and neck supple.  Skin:    General: Skin is warm and dry.  Neurological:     General: No focal deficit present.     Mental Status: He is oriented to person, place, and time.  Psychiatric:        Mood and Affect: Mood normal.       Lab Results Lab Results  Component Value Date   WBC 14.6 (H) 08/20/2022   HGB 10.5 (L) 08/20/2022   HCT 33.3 (L) 08/20/2022   MCV 83.0 08/20/2022   PLT 252 08/20/2022    Lab Results  Component Value Date   CREATININE 0.89 08/19/2022   BUN 12 08/19/2022   NA 136 08/19/2022   K 3.6 08/19/2022   CL 102 08/19/2022   CO2 27 08/19/2022    Lab Results  Component Value Date   ALT 30 08/19/2022   AST 28 08/19/2022   ALKPHOS 352 (H) 08/19/2022   BILITOT 0.5 08/19/2022        Laurice Record, [facility] Infectious [facility] 08/20/2022, 6:35 AM

## 2022-08-20 NOTE — Evaluation (Signed)
Occupational Therapy Evaluation Patient Details Name: William [name] MRN: [id number] DOB: 29-May-1961 Today's Date: 08/20/2022   History of Present Illness Patient is 62 yo male admitted 08/16/22  for evaluation of persistent left-sided low back pain and pain along the suprapubic region , treated for dysuria recently, S/P PROSTATE ABSCESS UNROOFING, TRANSURETHRAL RESECTION OF THE PROSTATE. PMH: right great toe amputation,DM,HTN.previous history:On 08/12/2022 the patient was admitted to the outside hospital with DKA a and A-fib with RVR.  He was complaining of weakness shortness of breath.  He was noted to be hypoxic and his blood sugar was over 500.  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  Further evaluation with CT scan subsequently demonstrated possible prostatic abscess and psoas abscess on the left.   Clinical Impression   This 62 yo male admitted with above presents to acute OT with PLOF of totally independent with basic ADLs, IADLs, driving and working full time for [employer] department. Currently he is setup-total A for baisc ADLs at bed level and Mod A to roll in bed--movement of legs R>L). He will continue to benefit from acute OT with follow up on AIR recommended due to patient's potential and his PLOF     Recommendations for follow up therapy are one component of a multi-disciplinary discharge planning process, led by the attending physician.  Recommendations may be updated based on patient status, additional functional criteria and insurance authorization.   Follow Up Recommendations  Acute inpatient rehab (3hours/day)     Assistance Recommended at Discharge Frequent or constant Supervision/Assistance  Patient can return home with the following Two people to help with walking and/or transfers;Two people to help with bathing/dressing/bathroom;Assistance with cooking/housework;Help with stairs or  ramp for entrance;Assist for transportation    Functional Status Assessment  Patient has had a recent decline in their functional status and demonstrates the ability to make significant improvements in function in a reasonable and predictable amount of time.  Equipment Recommendations  Other (comment) (TBD next venue)       Precautions / Restrictions Precautions Precautions: Fall Precaution Comments: bladder irigation, bariatric, profound weakness of legs, recent gr. toe omp on  Left Restrictions Weight Bearing Restrictions: Yes LLE Weight Bearing: Weight bearing as tolerated Other Position/Activity Restrictions: in [place] shoe (has in room)      Mobility Bed Mobility Overal bed mobility: Needs Assistance Bed Mobility: Rolling Rolling: Mod assist (A to bend knee (R>L))         General bed mobility comments: total A +4 for lateral transfer from bed to bed           ADL either performed or assessed with clinical judgement   ADL Overall ADL's : Needs assistance/impaired Eating/Feeding: Independent;Bed level   Grooming: Set up;Bed level   Upper Body Bathing: Minimal assistance;Bed level   Lower Body Bathing: Total assistance;Bed level   Upper Body Dressing : Minimal assistance;Bed level   Lower Body Dressing: Total assistance;Bed level                       Vision Patient Visual Report: No change from baseline              Pertinent Vitals/Pain Pain Assessment Pain Assessment: Faces Faces Pain Scale: Hurts little more Pain Location: left foot Pain Descriptors / Indicators: Aching, Sore Pain Intervention(s): Limited activity within patient's tolerance, Monitored during session, Repositioned     Hand Dominance  Right   Extremity/Trunk Assessment Upper Extremity Assessment Upper Extremity Assessment: Generalized weakness           Communication Communication Communication: No difficulties   Cognition Arousal/Alertness:  Awake/alert Behavior During Therapy: WFL for tasks assessed/performed Overall Cognitive Status: Within Functional Limits for tasks assessed                                                  Home Living Family/patient expects to be discharged to:: Inpatient rehab Living Arrangements: Spouse/significant other;Children Available Help at Discharge: Family;Available PRN/intermittently Type of Home: House Home Access: Stairs to enter Entrance Stairs-Number of Steps: 1   Home Layout: One level     Bathroom Shower/Tub: Tub/shower unit         Home Equipment: None          Prior Functioning/Environment Prior Level of Function : Independent/Modified Independent             Mobility Comments: works for [employer]          OT Problem List: Decreased strength;Decreased range of motion;Impaired balance (sitting and/or standing);Pain      OT Treatment/Interventions: Self-care/ADL training;DME and/or AE instruction;Patient/family education;Balance training;Therapeutic exercise    OT Goals(Current goals can be found in the care plan section) Acute Rehab OT Goals Patient Stated Goal: to be able to get up and walk again OT Goal Formulation: With patient Time For Goal Achievement: 09/03/22 Potential to Achieve Goals: Good  OT Frequency: Min 2X/week       AM-PAC OT "6 Clicks" Daily Activity     Outcome Measure Help from another person eating meals?: None Help from another person taking care of personal grooming?: A Little Help from another person toileting, which includes using toliet, bedpan, or urinal?: Total Help from another person bathing (including washing, rinsing, drying)?: A Lot Help from another person to put on and taking off regular upper body clothing?: A Little Help from another person to put on and taking off regular lower body clothing?: Total 6 Click Score: 14   End of Session Nurse Communication:  (RN A'ing with  transfer)  Activity Tolerance: Patient tolerated treatment well Patient left: in bed;with call bell/phone within reach  OT Visit Diagnosis: Other abnormalities of gait and mobility (R26.89);Muscle weakness (generalized) (M62.81);Pain Pain - Right/Left: Left Pain - part of body: Ankle and joints of foot                Time: 7673-4193 OT Time Calculation (min): 50 min Charges:  OT General Charges $OT Visit: 1 Visit OT Evaluation $OT Eval Moderate Complexity: 1 Mod OT Treatments $Therapeutic Activity: 8-22 mins  [place] Circle, OTR/L Acute Rehab Services [facility] [phone] Office [phone]    Almon Register 08/20/2022, 4:04 PM

## 2022-08-20 NOTE — Assessment & Plan Note (Signed)
Mild, hold fluids.

## 2022-08-20 NOTE — Anesthesia Postprocedure Evaluation (Signed)
Anesthesia Post Note  Patient: Physiological scientist  Procedure(s) Performed: PROSTATE ABSCESS UNROOFING, TRANSURETHRAL RESECTION OF THE PROSTATE (TURP) (Prostate)     Patient location during evaluation: PACU Anesthesia Type: General Level of consciousness: patient cooperative Pain management: pain level controlled Vital Signs Assessment: post-procedure vital signs reviewed and stable Respiratory status: spontaneous breathing, nonlabored ventilation, respiratory function stable and patient connected to nasal cannula oxygen Cardiovascular status: blood pressure returned to baseline and stable Postop Assessment: no apparent nausea or vomiting Anesthetic complications: no   No notable events documented.  Last Vitals:  Vitals:   08/20/22 0519 08/20/22 1407  BP:  (!) 152/98  Pulse:  92  Resp: 20 18  Temp:  36.4 C  SpO2:  94%    Last Pain:  Vitals:   08/20/22 1557  TempSrc:   PainSc: 4                  Delylah Stanczyk

## 2022-08-20 NOTE — Progress Notes (Signed)
OT Cancellation Note  Patient Details Name: William [name] MRN: [id number] DOB: [birth year]   Cancelled Treatment:    Reason Eval/Treat Not Completed: Other (comment). Plan is to get another bariatric bed that is hopefully better suited for pt's body habitus and mobility. Will await this decision before proceeding with evaluation due to current bed very limiting for patient.  [place], OTR/L [facility] [phone] Office [phone]    Almon Register 08/20/2022, 7:33 AM

## 2022-08-20 NOTE — Progress Notes (Signed)
Continues to decline nocturnal CPAP 

## 2022-08-20 NOTE — Progress Notes (Signed)
Physical Therapy Treatment Patient Details Name: William [name] MRN: [id number] DOB: 04-30-1961 Today's Date: 08/20/2022   History of Present Illness Patient is 62 yo male admitted 08/16/22  for evaluation of persistent left-sided low back pain and pain along the suprapubic region , treated for dysuria recently, S/P PROSTATE ABSCESS UNROOFING, TRANSURETHRAL RESECTION OF THE PROSTATE. PMH: right great toe amputation,DM,HTN.previous history:On 08/12/2022 the patient was admitted to the outside hospital with DKA a and A-fib with RVR.  He was complaining of weakness shortness of breath.  He was noted to be hypoxic and his blood sugar was over 500.  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  Further evaluation with CT scan subsequently demonstrated possible prostatic abscess and psoas abscess on the left.    PT Comments    Went into patient room to inform him about kregg tilt  bed being ordered. Patient stating that he really wants to sit up.  Patient assisted to sitting on bed edge after mattress deflated with +2 total assistance. Patient unable to assist at all with moving  either leg, he can rotate legs at hips but very weak to nil to  flex hip against gravity.  Patient sat  on bed edge with max support x ~  44minutes, began to complain of dizziness/feeling like he would pass out, noted pallor. Assisted patient back to supine. Kreg tilt bed to be delivered today, so hopefully can enhance patient's safe mobility.  Patient does demonstrate  profound weakness of Both LE's, although being supine and having to work against gravity is inhibiting.   Recommendations for follow up therapy are one component of a multi-disciplinary discharge planning process, led by the attending physician.  Recommendations may be updated based on patient status, additional functional criteria and insurance authorization.  Follow Up Recommendations   Skilled nursing-short term rehab (<3 hours/day) May benefit from AIR once mobility is established. Can patient physically be transported by private vehicle: No   Assistance Recommended at Discharge Frequent or constant Supervision/Assistance  Patient can return home with the following Two people to help with walking and/or transfers;A lot of help with bathing/dressing/bathroom;Assist for transportation;Help with stairs or ramp for entrance   Equipment Recommendations   (TBD)    Recommendations for Other Services       Precautions / Restrictions Precautions Precautions: Fall Precaution Comments: bladder irigation, bariatric, profound weakness of legs, recent gr. toe omp on  Left Restrictions Weight Bearing Restrictions: No     Mobility  Bed Mobility Overal bed mobility: Needs Assistance Bed Mobility: Supine to Sit, Sit to Supine     Supine to sit: HOB elevated, +2 for safety/equipment, +2 for physical assistance, Total assist Sit to supine: +2 for physical assistance, +2 for safety/equipment, Total assist   General bed mobility comments: mattress deflated,  Total assist to move  the legs to bed edge, total assist at trunk to move to sitting as legs were swung over bed edge.  Patient  then able to reach foot board and pull self to sitting more upright. Total assist to return to supine after ~ 2 minutes with complaints of dizziness and noted to be less verbal, fainty look. patient reported that he felt dizzy and near to passing out    Transfers                        Ambulation/Gait  Stairs             Wheelchair Mobility    Modified Rankin (Stroke Patients Only)       Balance Overall balance assessment: Needs assistance Sitting-balance support: Feet unsupported, Bilateral upper extremity supported Sitting balance-[name] Scale: Poor Sitting balance - Comments: reliant on trunk support Postural control: Posterior lean                                   Cognition Arousal/Alertness: Awake/alert Behavior During Therapy: WFL for tasks assessed/performed Overall Cognitive Status: Within Functional Limits for tasks assessed                                 General Comments: asking to sit up,        Exercises      General Comments        Pertinent Vitals/Pain Pain Assessment Faces Pain Scale: Hurts little more Pain Location: left foot,  both legs Pain Descriptors / Indicators: Discomfort Pain Intervention(s): Limited activity within patient's tolerance, Monitored during session    Home Living                          Prior Function            PT Goals (current goals can now be found in the care plan section) Progress towards PT goals: Progressing toward goals    Frequency    Min 3X/week      PT Plan Current plan remains appropriate    Co-evaluation              AM-PAC PT "6 Clicks" Mobility   Outcome Measure  Help needed turning from your back to your side while in a flat bed without using bedrails?: Total Help needed moving from lying on your back to sitting on the side of a flat bed without using bedrails?: Total Help needed moving to and from a bed to a chair (including a wheelchair)?: Total Help needed standing up from a chair using your arms (e.g., wheelchair or bedside chair)?: Total Help needed to walk in hospital room?: Total Help needed climbing 3-5 steps with a railing? : Total 6 Click Score: 6    End of Session   Activity Tolerance: Treatment limited secondary to medical complications (Comment) (dizziness.) Patient left: in bed;with call bell/phone within reach Nurse Communication: Mobility status;Need for lift equipment PT Visit Diagnosis: Unsteadiness on feet (R26.81);Difficulty in walking, not elsewhere classified (R26.2);Pain Pain - Right/Left: Left Pain - part of body: Ankle and joints of foot     Time: 7209-4709 PT Time  Calculation (min) (ACUTE ONLY): 25 min  Charges:  $Therapeutic Activity: 23-37 mins                     [place] Office [phone] Weekend [phone]    Claretha Cooper 08/20/2022, 1:20 PM

## 2022-08-20 NOTE — Consult Note (Signed)
Urology Inpatient Progress Report  Prostatic abscess [N41.2]  Procedure(s): PROSTATE ABSCESS UNROOFING, TRANSURETHRAL RESECTION OF THE PROSTATE (TURP)  2 Days Post-Op   Intv/Subj: No acute events overnight. Feeling a lot better CBI off since yesterday AM IR felt abscess to small to drain, continue Abx    Principal Problem:   MSSA bacteremia Active Problems:   Hypercholesteremia   Morbid obesity ([place])   Prostatic abscess   Normocytic anemia   Paroxysmal atrial fibrillation (HCC)   Osteomyelitis of left foot (HCC)   Severe obstructive sleep apnea   Type 2 diabetes mellitus (HCC)   Possible psoas muscle infection   Hypokalemia   Hyperthyroidism  Current Facility-Administered Medications  Medication Dose Route Frequency Provider Last Rate Last Admin   acetaminophen (TYLENOL) tablet 650 mg  650 mg Oral Q6H PRN Ardis Hughs, MD       Or   acetaminophen (TYLENOL) suppository 650 mg  650 mg Rectal Q6H PRN Ardis Hughs, MD       bisacodyl (DULCOLAX) suppository 10 mg  10 mg Rectal Daily PRN Ardis Hughs, MD       ceFAZolin (ANCEF) IVPB 2g/100 mL premix  2 g Intravenous Q8H Vu, Trung T, MD 200 mL/hr at 08/20/22 0523 2 g at 08/20/22 0523   Chlorhexidine Gluconate Cloth 2 % PADS 6 each  6 each Topical Daily Ardis Hughs, MD   6 each at 08/19/22 1021   fentaNYL (SUBLIMAZE) injection 50 mcg  50 mcg Intravenous Q2H PRN Ardis Hughs, MD   50 mcg at 08/18/22 0520   insulin aspart (novoLOG) injection 0-15 Units  0-15 Units Subcutaneous TID WC Ardis Hughs, MD   3 Units at 08/19/22 1747   naloxone [facility]) injection 0.4 mg  0.4 mg Intravenous PRN Ardis Hughs, MD       oxyCODONE (Oxy IR/ROXICODONE) immediate release tablet 5 mg  5 mg Oral Q4H PRN Ardis Hughs, MD   5 mg at 08/20/22 0520   senna-docusate (Senokot-S) tablet 2 tablet  2 tablet Oral BID Ardis Hughs, MD   2 tablet at 08/19/22 1019   sodium chloride irrigation 0.9 %  3,000 mL  3,000 mL Irrigation Continuous Ardis Hughs, MD   3,000 mL at 08/19/22 0553   tamsulosin (FLOMAX) capsule 0.4 mg  0.4 mg Oral Daily Edwin Dada, MD   0.4 mg at 08/19/22 1020     Objective: Vital: Vitals:   08/19/22 1600 08/19/22 2131 08/20/22 0434 08/20/22 0519  BP:  125/68 (!) 153/88   Pulse:  94 88   Resp:  18 16 20   Temp:  98.1 F (36.7 C) 97.8 F (36.6 C)   TempSrc:  Oral Oral   SpO2: 92% 95% 93%    I/Os: I/O last 3 completed shifts: In: 7120 [P.O.:720; Other:6000; IV Piggyback:400] Out: 13900 [Urine:13900]  Physical Exam:  General: Patient is in no apparent distress, morbidly obese CV: RRR Lungs: Normal respiratory effort, chest expands symmetrically. Foley: light pink urine off CBI Ext: left foot wrapped.  Lab Results: Recent Labs    08/18/22 0511 08/19/22 1025 08/20/22 0543  WBC 17.6* 17.8* 14.6*  HGB 10.5* 10.5* 10.5*  HCT 32.9* 34.1* 33.3*   Recent Labs    08/18/22 0511 08/19/22 1025 08/20/22 0543  NA 136 136 133*  K 3.3* 3.6 3.1*  CL 104 102 99  CO2 25 27 27   GLUCOSE 106* 195* 227*  BUN 12 12 11   CREATININE 0.79 0.89 0.78  CALCIUM 7.5* 7.6* 7.4*   No results for input(s): "LABPT", "INR" in the last 72 hours.  No results for input(s): "LABURIN" in the last 72 hours. Results for orders placed or performed during the hospital encounter of 08/16/22  Culture, blood (Routine X 2) w Reflex to ID Panel     Status: None (Preliminary result)   Collection Time: 08/17/22  1:15 AM   Specimen: BLOOD  Result Value Ref Range Status   Specimen Description   Final    BLOOD BLOOD LEFT HAND Performed at [facility] [address] [place], [place] [address]    Special Requests   Final    BOTTLES DRAWN AEROBIC ONLY SITE NOT SPECIFIED Performed at [facility] [address], [place], [place] [address]    Culture   Final    NO GROWTH 2 DAYS Performed at [facility] [address], [place], [place] [address]    Report Status PENDING  Incomplete  Culture, blood (Routine X 2) w Reflex to ID Panel     Status: None (Preliminary result)   Collection Time: 08/17/22  1:15 AM   Specimen: BLOOD  Result Value Ref Range Status   Specimen Description   Final    BLOOD BLOOD RIGHT HAND Performed at [facility] [address], [place], [place] [address]    Special Requests   Final    BOTTLES DRAWN AEROBIC ONLY Blood Culture adequate volume Performed at [facility] [address], [place], [place] [address]    Culture   Final    NO GROWTH 2 DAYS Performed at [facility] [address] [place], [place] [address]    Report Status PENDING  Incomplete  Urine Culture     Status: Abnormal   Collection Time: 08/18/22 11:17 AM   Specimen: Urine, Cystoscope  Result Value Ref Range Status   Specimen Description   Final    CYSTOSCOPY Performed at [facility] [address], [place], [place] [address]    Special Requests   Final    NONE Performed at [facility] [place] [address], [place],  [address]    Culture >=100,000 COLONIES/mL STAPHYLOCOCCUS AUREUS (A)  Final   Report Status 08/20/2022 FINAL  Final   Organism ID, Bacteria STAPHYLOCOCCUS AUREUS (A)  Final      Susceptibility   Staphylococcus aureus - MIC*    CIPROFLOXACIN <=0.5 SENSITIVE Sensitive     ERYTHROMYCIN >=8 RESISTANT Resistant     GENTAMICIN <=0.5 SENSITIVE Sensitive     OXACILLIN <=0.25 SENSITIVE Sensitive     TETRACYCLINE <=1 SENSITIVE Sensitive     VANCOMYCIN 1 SENSITIVE Sensitive     TRIMETH/SULFA <=10 SENSITIVE Sensitive     CLINDAMYCIN <=0.25 SENSITIVE Sensitive     RIFAMPIN <=0.5 SENSITIVE Sensitive     Inducible Clindamycin NEGATIVE Sensitive     * >=100,000 COLONIES/mL STAPHYLOCOCCUS AUREUS    Studies/Results: CT scan done overnight demonstrating enlarging prostatic abscess.  I reviewed these images and  discussed with patient  Assessment: Procedure(s): PROSTATE ABSCESS UNROOFING, TRANSURETHRAL RESECTION OF THE PROSTATE (TURP), 2 Days Post-Op    Feeling better after abscess drainage of prostate.  Plan: MSSA from prostate abscess - abx duration per ID One more day of foley drainage, D/c Foley catheter tomorrow I am off tomorrow.  Will see Thursday if needed.  I will schedule him to f/u in clinic in a few weeks.  Marland Kitchen  [name], MD Urology 08/20/2022, 7:24 AM

## 2022-08-20 NOTE — Progress Notes (Addendum)
Progress Note   Patient: William [name] ZOX:[id number] DOB: [birth year] DOA: 08/16/2022     4 DOS: the patient was seen and examined on 08/20/2022 at 11:14AM      Brief hospital course: Mr. Ferns is a 62 y.o. M with DM, HTN, MO, as well as recent osteomyelitis of the left great toe status post amputation, who was transferred from outside hospital for prostatic abscess.  2 weeks prior to admission patient diagnosed with prostatitis, treated with Cipro for MSSA.  1 week ago, patient presented to outside hospital with atrial fibrillation, sepsis symptomology, found to have MSSA bacteremia, iliopsoas and prostatic abscesses.     1/13: Transferred to [place], Urology and IR consulted 1/14: To the OR for prostate abscess treatment, ID consulted 1/16: Increased L flank pain, CT ordered     Assessment and Plan: * MSSA bacteremia 1/8 BCx positive at [facility] for MSSA in 2/2 1/13 BCx drawn here, NGTD  Source likely toe ulcer, disseminated to prostate and psoas/vertebral. TTE negative at OSH, given psoas involvement (and therefore presumed vertebral involvement, see ID note 1/14 Dr. Gale Journey) will defer TEE  IJ removed 1/15 Increased left flank pain today, see below - Consult ID, appreciate expertise - Continue cefazolin, plan for 6 weeks - Obtain CT abdomen and pelvis    Possible psoas muscle infection CT from 1/13 showed possible abscesses bilateral psoas, R>L.  IR reviewed, did not feel that this was large enough to target at this time. Recommended follow up.  Today, pain in left flank increased substantially - Obtain CT abdomen and pelvis   Prostatic abscess S/p TURP, prostate abscess unroofing 1/14 by Dr. Louis Meckel - One more day of foley drainage, D/c Foley catheter tomorrow   Osteomyelitis of left foot ([place]) S/p LEFT great toe partial amputation on 1/11 by Dr. Rocco Serene up at [facility] Discussed with Dr. Rocco Serene, surgeon 1/15 by phone Changed post-op dressing on 1/15, toe looks good,  no drainage, dehiscence   Dr. Rocco Serene recommended: - Change dressing daily -- xeroform, wrapped in gauze, covered in ACE wrap - WBAT in post op shoe - After two weeks (so around Jan 25) stop Xeroform in dressing and use adaptic - Leave sutures in place for 1 month (remove Feb 11) - Follow up with Dr. Rocco Serene in [place] within 1 month, preferably within a week of discharge to home or SNF    Hyperthyroidism Abnormal thyroid testing at OSH and briefly on PTU there.  Repeat TSH here normal - Follow fT4, T3  Hyponatremia Mild, hold fluids.  Hypokalemia - Supplement K  Type 2 diabetes mellitus (HCC) Glucoses controlled, A1c >12% - Continue Ss corrections  Severe obstructive sleep apnea - CPAP at night  Paroxysmal atrial fibrillation (HCC) New diagnosis at last admission.  CHA2DS2-Vasc 1.   - Defer anticoagulation for now given low CHADS - Monitor on tele    Normocytic anemia Hgb 10 in setting of recent orthopedic surgery  Morbid obesity ([place]) BMI 47, unclear if he has malnutrition, consult dietitian  Hypercholesteremia Not on statin          Subjective: has increased vertebral pain today.  No confusion, no vomiting, no respiratory symptoms.     Physical Exam: BP (!) 152/98 (BP Location: Right Arm)   Pulse 92   Temp 97.6 F (36.4 C) (Oral)   Resp 18   SpO2 94%   Obese adult male, lying in bed, no acute distress, appears tired Heart rate irregular, no murmurs, no pitting edema Respiratory rate seems normal,  lungs clear without rales or wheezes, diminished due to body habitus Abdomen without focal tenderness or guarding Attention normal, affect normal, judgment and insight appear normal, upper extremity strength appears normal, lower extremity strength severely weak   Data Reviewed: Discussed with infectious disease Patient metabolic panel shows mild hyponatremia, hypokalemia, stable renal function Hemogram shows white blood cell count down to 14,  anemia stable TSH normal    Family Communication: Wife    Disposition: Status is: Inpatient Patient was admitted with MSSA bacteremia, disseminated to prostate and spine  He has had source control of the prostate, will need continued IV antibiotics and monitoring of his psoas abscess with repeat imaging  He is significantly debilitated and may need SNF  Will need 6 weeks of IV antibiotics        Author: Edwin Dada, MD 08/20/2022 3:51 PM  For on call review [email or web address].

## 2022-08-20 NOTE — Progress Notes (Signed)
Pharmacy Antibiotic Note  William [name] is a 62 y.o. male admitted on 08/16/2022 with prostatic abscess and MSSA bacteremia. Pharmacy has been consulted for Ancef dosing.  Plan: -Continue Ancef 2 g IV every 8 hours -ID following for length of therapy -Pharmacy to sign off consult - no dose adjustments indicated   Weight:  (unable to weigh patient on the bed scale)  Temp (24hrs), Avg:98 F (36.7 C), Min:97.8 F (36.6 C), Max:98.1 F (36.7 C)  Recent Labs  Lab 08/17/22 0115 08/18/22 0511 08/19/22 1025 08/20/22 0543  WBC 18.5* 17.6* 17.8* 14.6*  CREATININE 0.95 0.79 0.89 0.78     Estimated Creatinine Clearance: 172.4 mL/min (by C-G formula based on SCr of 0.78 mg/dL).    Allergies  Allergen Reactions   Diltiazem Hcl Other (See Comments)    Causes accelerated heart rate    Antimicrobials this admission: 1/13 Rocephin >> 1/14 1/13 vancomycin >> 1/14 1/14 Ancef >>  Microbiology results: 1/8   BCx:  positive at [facility] for MSSA in 2/2  1/13 BCx: ngtd 1/14 Ucx: MSSA  [place], PharmD, BCPS Clinical Pharmacist 08/20/2022 2:01 PM

## 2022-08-20 NOTE — Progress Notes (Signed)
Physical Therapy Treatment Patient Details Name: William [name] MRN: [id number] DOB: [birth year] Today's Date: 08/20/2022   History of Present Illness Patient is 62 yo male admitted 08/16/22  for evaluation of persistent left-sided low back pain and pain along the suprapubic region , treated for dysuria recently, S/P PROSTATE ABSCESS UNROOFING, TRANSURETHRAL RESECTION OF THE PROSTATE. PMH: right great toe amputation,DM,HTN.previous history:On 08/12/2022 the patient was admitted to the outside hospital with DKA a and A-fib with RVR.  He was complaining of weakness shortness of breath.  He was noted to be hypoxic and his blood sugar was over 500.  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  Further evaluation with CT scan subsequently demonstrated possible prostatic abscess and psoas abscess on the left.    PT Comments    Patient transferred to specialty tilt bed to  facilitate patient's  safe mobility.  Patient present with profound weakness, more  than would be expected considering that he was ambulating  PTA, although patient reported limited since toe amputation.  Patient has very weak Left quads, able to initiate hip flexion but  with minimal result. Right hip flexion is weak but demonstrates more motor function , although still not flexing right  hip/knee as would expect  in supine, ? Some limits by body habitus.  Patient reports  decreased sensation in left thigh, also patchy decreased sensation on right leg- will test further next visit. Continue PT for  tilting bed to assess LE strength and for weight bearing.  Recommendations for follow up therapy are one component of a multi-disciplinary discharge planning process, led by the attending physician.  Recommendations may be updated based on patient status, additional functional criteria and insurance authorization.  Follow Up Recommendations  Acute inpatient rehab  (3hours/day) Can patient physically be transported by private vehicle: No   Assistance Recommended at Discharge Frequent or constant Supervision/Assistance  Patient can return home with the following Two people to help with walking and/or transfers;A lot of help with bathing/dressing/bathroom;Assist for transportation;Help with stairs or ramp for entrance   Equipment Recommendations   (TBA)    Recommendations for Other Services Rehab consult     Precautions / Restrictions Precautions Precautions: Fall Precaution Comments: bladder irigation, bariatric, profound weakness of legs, L > R, recent gr. toe amp on  Left Restrictions Weight Bearing Restrictions: Yes LLE Weight Bearing: Weight bearing as tolerated Other Position/Activity Restrictions: in [place] shoe (has in room)     Mobility  Bed Mobility Overal bed mobility: Needs Assistance Bed Mobility: Supine to Sit, Sit to Supine     Supine to sit: HOB elevated, +2 for safety/equipment, +2 for physical assistance, Total assist Sit to supine: +2 for physical assistance, +2 for safety/equipment, Total assist   General bed mobility comments: Requires max assist to flex the knee and hip to facilitate the rolling.total A +4 for lateral transfer from bed to bed    Transfers                        Ambulation/Gait                   Stairs             Wheelchair Mobility    Modified Rankin (Stroke Patients Only)       Balance Overall balance assessment: Needs assistance Sitting-balance support: Feet unsupported, Bilateral upper extremity supported Sitting balance-[name] Scale:  Poor Sitting balance - Comments: NT this visit Postural control: Posterior lean                                  Cognition Arousal/Alertness: Awake/alert Behavior During Therapy: WFL for tasks assessed/performed Overall Cognitive Status: Within Functional Limits for tasks assessed                                  General Comments: asking to sit up,        Exercises      General Comments        Pertinent Vitals/Pain Pain Assessment Faces Pain Scale: Hurts little more Pain Location: left foot Pain Descriptors / Indicators: Aching, Sore Pain Intervention(s): Monitored during session    Home Living Family/patient expects to be discharged to:: Inpatient rehab Living Arrangements: Spouse/significant other;Children Available Help at Discharge: Family;Available PRN/intermittently Type of Home: House Home Access: Stairs to enter   [employer] of Steps: 1   Home Layout: One level Home Equipment: None      Prior Function            PT Goals (current goals can now be found in the care plan section) Progress towards PT goals: Progressing toward goals    Frequency    Min 3X/week      PT Plan Current plan remains appropriate    Co-evaluation PT/OT/SLP Co-Evaluation/Treatment: Yes Reason for Co-Treatment: For patient/therapist safety;To address functional/ADL transfers PT goals addressed during session: Mobility/safety with mobility OT goals addressed during session: ADL's and self-care      AM-PAC PT "6 Clicks" Mobility   Outcome Measure  Help needed turning from your back to your side while in a flat bed without using bedrails?: Total Help needed moving from lying on your back to sitting on the side of a flat bed without using bedrails?: Total Help needed moving to and from a bed to a chair (including a wheelchair)?: Total Help needed standing up from a chair using your arms (e.g., wheelchair or bedside chair)?: Total Help needed to walk in hospital room?: Total Help needed climbing 3-5 steps with a railing? : Total 6 Click Score: 6    End of Session   Activity Tolerance: Patient tolerated treatment well Patient left: in bed;with call bell/phone within reach Nurse Communication: Mobility status;Need for lift equipment PT Visit Diagnosis:  Unsteadiness on feet (R26.81);Difficulty in walking, not elsewhere classified (R26.2);Pain Pain - Right/Left: Left Pain - part of body: Ankle and joints of foot     Time: 1406-1505 PT Time Calculation (min) (ACUTE ONLY): 59 min  Charges:  $Therapeutic Activity: 23-37 mins                     Tresa Endo PT Acute Rehabilitation Services Office [phone] Weekend [phone]    Claretha Cooper 08/20/2022, 4:30 PM

## 2022-08-21 ENCOUNTER — Inpatient Hospital Stay (HOSPITAL_COMMUNITY): Payer: Commercial Managed Care - PPO

## 2022-08-21 DIAGNOSIS — R7881 Bacteremia: Secondary | ICD-10-CM | POA: Diagnosis not present

## 2022-08-21 DIAGNOSIS — K6812 Psoas muscle abscess: Secondary | ICD-10-CM | POA: Diagnosis not present

## 2022-08-21 DIAGNOSIS — M86072 Acute hematogenous osteomyelitis, left ankle and foot: Secondary | ICD-10-CM | POA: Diagnosis not present

## 2022-08-21 DIAGNOSIS — I480 Paroxysmal atrial fibrillation: Secondary | ICD-10-CM

## 2022-08-21 DIAGNOSIS — N412 Abscess of prostate: Secondary | ICD-10-CM | POA: Diagnosis not present

## 2022-08-21 DIAGNOSIS — E440 Moderate protein-calorie malnutrition: Secondary | ICD-10-CM

## 2022-08-21 LAB — CBC
HCT: 33.1 % — ABNORMAL LOW (ref 39.0–52.0)
Hemoglobin: 10.6 g/dL — ABNORMAL LOW (ref 13.0–17.0)
MCH: 26.4 pg (ref 26.0–34.0)
MCHC: 32 g/dL (ref 30.0–36.0)
MCV: 82.3 fL (ref 80.0–100.0)
Platelets: 312 10*3/uL (ref 150–400)
RBC: 4.02 MIL/uL — ABNORMAL LOW (ref 4.22–5.81)
RDW: 14.8 % (ref 11.5–15.5)
WBC: 14.3 10*3/uL — ABNORMAL HIGH (ref 4.0–10.5)
nRBC: 0 % (ref 0.0–0.2)

## 2022-08-21 LAB — BASIC METABOLIC PANEL
Anion gap: 8 (ref 5–15)
BUN: 11 mg/dL (ref 6–20)
CO2: 30 mmol/L (ref 22–32)
Calcium: 7.6 mg/dL — ABNORMAL LOW (ref 8.9–10.3)
Chloride: 94 mmol/L — ABNORMAL LOW (ref 98–111)
Creatinine, Ser: 0.72 mg/dL (ref 0.61–1.24)
GFR, Estimated: >60 mL/min (ref >60)
Glucose, Bld: 248 mg/dL — ABNORMAL HIGH (ref 70–99)
Potassium: 3.5 mmol/L (ref 3.5–5.1)
Sodium: 132 mmol/L — ABNORMAL LOW (ref 135–145)

## 2022-08-21 LAB — GLUCOSE, CAPILLARY
Glucose-Capillary: 261 mg/dL — ABNORMAL HIGH (ref 70–99)
Glucose-Capillary: 233 mg/dL — ABNORMAL HIGH (ref 70–99)
Glucose-Capillary: 257 mg/dL — ABNORMAL HIGH (ref 70–99)
Glucose-Capillary: 208 mg/dL — ABNORMAL HIGH (ref 70–99)

## 2022-08-21 LAB — T3: T3, Total: 65 ng/dL — ABNORMAL LOW (ref 71–180)

## 2022-08-21 MED ORDER — IOHEXOL 300 MG/ML  SOLN
100.0000 mL | Freq: Once | INTRAMUSCULAR | Status: AC | PRN
  Administered 2022-08-21: 100 mL via INTRAVENOUS

## 2022-08-21 MED ORDER — ENSURE MAX PROTEIN PO LIQD
11.0000 [oz_av] | Freq: Two times a day (BID) | ORAL | Status: DC
  Administered 2022-08-21 – 2022-09-06 (×30): 11 [oz_av] via ORAL
  Filled 2022-08-21 (×32): qty 330

## 2022-08-21 MED ORDER — ADULT MULTIVITAMIN W/MINERALS CH
1.0000 | ORAL_TABLET | Freq: Every day | ORAL | Status: DC
  Administered 2022-08-21 – 2022-09-06 (×16): 1 via ORAL
  Filled 2022-08-21 (×16): qty 1

## 2022-08-21 NOTE — Progress Notes (Signed)
Patient declines nocturnal CPAP as he has since admission.

## 2022-08-21 NOTE — Progress Notes (Signed)
Initial Nutrition Assessment  DOCUMENTATION CODES:   Non-severe (moderate) malnutrition in context of acute illness/injury, Obesity unspecified  INTERVENTION:   -Needs weight for admission, if able  -Ensure MAX Protein po BID, each supplement provides 150 kcal and 30 grams of protein   -Multivitamin with minerals daily  -Placed "Carbohydrate Counting" and " Plate Method" handout in AVS. Reviewed diet principles with patient.  NUTRITION DIAGNOSIS:   Moderate Malnutrition related to acute illness (osteomyelitis) as evidenced by mild fat depletion, mild muscle depletion, energy intake < 75% for > 7 days.  GOAL:   Patient will meet greater than or equal to 90% of their needs  MONITOR:   PO intake, Supplement acceptance, Labs, Weight trends, I & O's  REASON FOR ASSESSMENT:   Consult Assessment of nutrition requirement/status  ASSESSMENT:   Pt with DM, HTN, MO, hyperthyroidism as well as recent osteomyelitis of the left great toe status post amputation, who was transferred from outside hospital for prostatic abscess.  Patient in room, no family at bedside. Pt states he is eating well here. States he eats 100% of his meals. Reports he has had poor appetite since 1/8. States he tries to eat 1 meal a day. He and his wife changed their diet starting around the holidays, consuming 1 meal that consists of protein, vegetables and fruits. Typically skips breakfast, if he does eat lunch it would be fast food and then his wife cooks his dinner meals. Primarily drinks water with lemon slices. Pt states he has definitely lost weight and feels like he has lost muscle mass.  Pt denies any issues with swallowing or chewing.  Discussed consistent meal intakes for blood sugar control. Pt agreeable to receiving protein shakes to aid in wound healing. Pt states he takes a men's one a day MVI at home. Will order daily MVI.  No weight for admission. Bed unable to weigh. Last weight recorded in  records is 385 lbs on 1/9. Pt with severe BLE edema.  Pt did weigh ~325 lbs on 07/31/22.   Medications: Senokot  Labs reviewed: CBGs: 193-261 Low Na   NUTRITION - FOCUSED PHYSICAL EXAM:  Flowsheet Row Most Recent Value  Orbital Region Mild depletion  Upper Arm Region Mild depletion  Thoracic and Lumbar Region No depletion  Buccal Region No depletion  Temple Region Mild depletion  Clavicle Bone Region No depletion  Clavicle and Acromion Bone Region No depletion  Scapular Bone Region No depletion  Dorsal Hand Mild depletion  Patellar Region No depletion  Anterior Thigh Region No depletion  Posterior Calf Region No depletion  Edema (RD Assessment) Severe  [BLE]  Hair Unable to assess  [shaved]  Eyes Reviewed  Mouth Reviewed  [poor dentition]  Skin Reviewed  Nails Reviewed       Diet Order:   Diet Order             Diet Carb Modified Fluid consistency: Thin; Room service appropriate? Yes  Diet effective now                   EDUCATION NEEDS:   Education needs have been addressed  Skin:  Skin Assessment: Reviewed RN Assessment  Last BM:  1/17  Height:   Ht Readings from Last 1 Encounters:  08/09/22 6\' 4"  (1.93 m)    Weight:   Wt Readings from Last 1 Encounters:  08/09/22 (!) 176.4 kg    BMI:  There is no height or weight on file to calculate BMI.  Estimated  Nutritional Needs:   Kcal:  2300-2500  Protein:  140-155g  Fluid:  2.5L/day   Clayton Bibles, MS, RD, LDN Inpatient Clinical Dietitian Contact information available via Amion

## 2022-08-21 NOTE — Progress Notes (Signed)
Physical Therapy Treatment Patient Details Name: William [name] MRN: [id number] DOB: March 30, 1961 Today's Date: 08/21/2022   History of Present Illness Patient is 62 yo male admitted 08/16/22  for evaluation of persistent left-sided low back pain and pain along the suprapubic region , treated for dysuria recently, S/P PROSTATE ABSCESS UNROOFING, TRANSURETHRAL RESECTION OF THE PROSTATE. PMH: right great toe amputation,DM,HTN.previous history:On 08/12/2022 the patient was admitted to the outside hospital with DKA a and A-fib with RVR.  He was complaining of weakness shortness of breath.  He was noted to be hypoxic and his blood sugar was over 500.  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  Further evaluation with CT scan subsequently demonstrated possible prostatic abscess and psoas abscess on the left.    PT Comments    Assisted  patient in repositioning  bed. Instructed patient and provided pictures of the control panel so patient can  assist nursing to reset  the  mattress settings after bed has been unplugged .  Patient reporting  attempts to sit upright increases pressure in both groin areas so did not further attempt  bed chair position.  Continue  PT and working towards tilt/standing .  Patient continues to demonstrate profound weakness of Both legs, inability to flex  hips in supine.  Recommendations for follow up therapy are one component of a multi-disciplinary discharge planning process, led by the attending physician.  Recommendations may be updated based on patient status, additional functional criteria and insurance authorization.  Follow Up Recommendations  Acute inpatient rehab (3hours/day) Can patient physically be transported by private vehicle: No   Assistance Recommended at Discharge Frequent or constant Supervision/Assistance  Patient can return home with the following Two people to help with walking  and/or transfers;A lot of help with bathing/dressing/bathroom;Assist for transportation;Help with stairs or ramp for entrance   Equipment Recommendations   (TBA)    Recommendations for Other Services Rehab consult     Precautions / Restrictions Precautions Precaution Comments: bladder irigation, bariatric, profound weakness of legs, L > R, recent gr. toe amp on  Left Required Braces or Orthoses: Splint/Cast Splint/Cast: post op shoe for left, has his boot for right in closet Restrictions LLE Weight Bearing: Weight bearing as tolerated     Mobility  Bed Mobility               General bed mobility comments: made efforts to adjust bed to sitting position. Patient reports increased pain and pressure when attempting to sit up. did not further pursue sitting in bed chair position.    Transfers                   General transfer comment: will need mechanical lift    Ambulation/Gait                   Stairs             Wheelchair Mobility    Modified Rankin (Stroke Patients Only)       Balance       Sitting balance - Comments: NT this visit                                    Cognition Arousal/Alertness: Awake/alert Behavior During Therapy: WFL for tasks assessed/performed Overall Cognitive Status: Within Functional Limits for tasks assessed  General Comments: states that he is tired from tests today        Exercises      General Comments        Pertinent Vitals/Pain Pain Assessment Faces Pain Scale: Hurts even more Pain Location: left foot, when pressure againstfoot from foot board, adjusted foot board to lengthen Pain Descriptors / Indicators: Grimacing, Guarding, Discomfort Pain Intervention(s): Monitored during session, Repositioned    Home Living                          Prior Function            PT Goals (current goals can now be found in the  care plan section) Progress towards PT goals: Not progressing toward goals - comment (testing and planned procedure 08/22/22)    Frequency    Min 3X/week      PT Plan Current plan remains appropriate    Co-evaluation              AM-PAC PT "6 Clicks" Mobility   Outcome Measure  Help needed turning from your back to your side while in a flat bed without using bedrails?: Total Help needed moving from lying on your back to sitting on the side of a flat bed without using bedrails?: Total Help needed moving to and from a bed to a chair (including a wheelchair)?: Total Help needed standing up from a chair using your arms (e.g., wheelchair or bedside chair)?: Total Help needed to walk in hospital room?: Total Help needed climbing 3-5 steps with a railing? : Total 6 Click Score: 6    End of Session   Activity Tolerance: Patient limited by fatigue Patient left: in bed;with call bell/phone within reach Nurse Communication: Mobility status;Need for lift equipment PT Visit Diagnosis: Unsteadiness on feet (R26.81);Difficulty in walking, not elsewhere classified (R26.2);Pain Pain - Right/Left: Left Pain - part of body: Ankle and joints of foot     Time: 1455-1515 PT Time Calculation (min) (ACUTE ONLY): 20 min  Charges:  $Therapeutic Activity: 8-22 mins                     [place] Office [phone] Weekend [phone]    Claretha Cooper 08/21/2022, 3:37 PM

## 2022-08-21 NOTE — Plan of Care (Signed)
  Problem: Clinical Measurements: Goal: Diagnostic test results will improve Outcome: Not Progressing   Problem: Clinical Measurements: Goal: Will remain free from infection Outcome: Not Progressing   Problem: Activity: Goal: Risk for activity intolerance will decrease Outcome: Not Progressing   Problem: Safety: Goal: Ability to remain free from injury will improve Outcome: Not Progressing

## 2022-08-21 NOTE — Inpatient Diabetes Management (Signed)
Inpatient Diabetes Program Recommendations  AACE/ADA: New Consensus Statement on Inpatient Glycemic Control (2015)  Target Ranges:  Prepandial:   less than 140 mg/dL      Peak postprandial:   less than 180 mg/dL (1-2 hours)      Critically ill patients:  140 - 180 mg/dL   Lab Results  Component Value Date   GLUCAP 261 (H) 08/21/2022   HGBA1C 12.9 (H) 08/17/2022    Review of Glycemic Control  Diabetes history: DM2 Outpatient Diabetes medications: None Current orders for Inpatient glycemic control: Novolog 0-15 TID  HgbA1C - 12.9% 261, 248 mg/dL  Inpatient Diabetes Program Recommendations:    Consider adding Semglee 10 units QD  Consider adding Novolog 3 units TID with meals if eating > 50%  Will likely need to go home on basal/bolus insulin. Will talk with pt at bedside.  Continue to follow.  Thank you. Lorenda Peck, RD, LDN, [place] Inpatient Diabetes Coordinator [phone]

## 2022-08-21 NOTE — Progress Notes (Signed)
Inpatient Rehab Admissions Coordinator:   Per therapy recommendations, patient was screened for CIR candidacy by Akiah Bauch, MS, CCC-SLP. At this time, Pt. is not at a level to tolerate the intensity of CIR.  Pt. may have potential to progress to becoming a potential CIR candidate, so CIR admissions team will follow and monitor for progress and participation with therapies and place consult order if Pt. appears to be an appropriate candidate. Please contact me with any questions.   Zaneta Lightcap, MS, CCC-SLP Rehab Admissions Coordinator  [phone] (celll) [phone] (office)  

## 2022-08-21 NOTE — Discharge Instructions (Signed)
Carbohydrate Counting For People With Diabetes  Foods with carbohydrates make your blood glucose level go up. Learning how to count carbohydrates can help you control your blood glucose levels. First, identify the foods you eat that contain carbohydrates. Then, using the Foods with Carbohydrates chart, determine about how much carbohydrates are in your meals and snacks. Make sure you are eating foods with fiber, protein, and healthy fat along with your carbohydrate foods. Foods with Carbohydrates The following table shows carbohydrate foods that have about 15 grams of carbohydrate each. Using measuring cups, spoons, or a food scale when you first begin learning about carbohydrate counting can help you learn about the portion sizes you typically eat. The following foods have 15 grams carbohydrate each:  Grains 1 slice bread (1 ounce)  1 small tortilla (6-inch size)   large bagel (1 ounce)  1/3 cup pasta or rice (cooked)   hamburger or hot dog bun ( ounce)   cup cooked cereal   to  cup ready-to-eat cereal  2 taco shells (5-inch size) Fruit 1 small fresh fruit ( to 1 cup)   medium banana  17 small grapes (3 ounces)  1 cup melon or berries   cup canned or frozen fruit  2 tablespoons dried fruit (blueberries, cherries, cranberries, raisins)   cup unsweetened fruit juice  Starchy Vegetables  cup cooked beans, peas, corn, potatoes/sweet potatoes   large baked potato (3 ounces)  1 cup acorn or butternut squash  Snack Foods 3 to 6 crackers  8 potato chips or 13 tortilla chips ( ounce to 1 ounce)  3 cups popped popcorn  Dairy 3/4 cup (6 ounces) nonfat plain yogurt, or yogurt with sugar-free sweetener  1 cup milk  1 cup plain rice, soy, coconut or flavored almond milk Sweets and Desserts  cup ice cream or frozen yogurt  1 tablespoon jam, jelly, pancake syrup, table sugar, or honey  2 tablespoons light pancake syrup  1 inch square of frosted cake or 2 inch square of unfrosted  cake  2 small cookies (2/3 ounce each) or  large cookie  Sometimes you'll have to estimate carbohydrate amounts if you don't know the exact recipe. One cup of mixed foods like soups can have 1 to 2 carbohydrate servings, while some casseroles might have 2 or more servings of carbohydrate. Foods that have less than 20 calories in each serving can be counted as "free" foods. Count 1 cup raw vegetables, or  cup cooked non-starchy vegetables as "free" foods. If you eat 3 or more servings at one meal, then count them as 1 carbohydrate serving.  Foods without Carbohydrates  Not all foods contain carbohydrates. Meat, some dairy, fats, non-starchy vegetables, and many beverages don't contain carbohydrate. So when you count carbohydrates, you can generally exclude chicken, pork, beef, fish, seafood, eggs, tofu, cheese, butter, sour cream, avocado, nuts, seeds, olives, mayonnaise, water, black coffee, unsweetened tea, and zero-calorie drinks. Vegetables with no or low carbohydrate include green beans, cauliflower, tomatoes, and onions. How much carbohydrate should I eat at each meal?  Carbohydrate counting can help you plan your meals and manage your weight. Following are some starting points for carbohydrate intake at each meal. Work with your registered dietitian nutritionist to find the best range that works for your blood glucose and weight.   To Lose Weight To Maintain Weight  Women 2 - 3 carb servings 3 - 4 carb servings  Men 3 - 4 carb servings 4 - 5 carb servings  Checking your   blood glucose after meals will help you know if you need to adjust the timing, type, or number of carbohydrate servings in your meal plan. Achieve and keep a healthy body weight by balancing your food intake and physical activity.  Tips How should I plan my meals?  Plan for half the food on your plate to include non-starchy vegetables, like salad greens, broccoli, or carrots. Try to eat 3 to 5 servings of non-starchy vegetables  every day. Have a protein food at each meal. Protein foods include chicken, fish, meat, eggs, or beans (note that beans contain carbohydrate). These two food groups (non-starchy vegetables and proteins) are low in carbohydrate. If you fill up your plate with these foods, you will eat less carbohydrate but still fill up your stomach. Try to limit your carbohydrate portion to  of the plate.  What fats are healthiest to eat?  Diabetes increases risk for heart disease. To help protect your heart, eat more healthy fats, such as olive oil, nuts, and avocado. Eat less saturated fats like butter, cream, and high-fat meats, like bacon and sausage. Avoid trans fats, which are in all foods that list "partially hydrogenated oil" as an ingredient. What should I drink?  Choose drinks that are not sweetened with sugar. The healthiest choices are water, carbonated or seltzer waters, and tea and coffee without added sugars.  Sweet drinks will make your blood glucose go up very quickly. One serving of soda or energy drink is  cup. It is best to drink these beverages only if your blood glucose is low.  Artificially sweetened, or diet drinks, typically do not increase your blood glucose if they have zero calories in them. Read labels of beverages, as some diet drinks do have carbohydrate and will raise your blood glucose. Label Reading Tips Read Nutrition Facts labels to find out how many grams of carbohydrate are in a food you want to eat. Don't forget: sometimes serving sizes on the label aren't the same as how much food you are going to eat, so you may need to calculate how much carbohydrate is in the food you are serving yourself.   Carbohydrate Counting for People with Diabetes Sample 1-Day Menu  Breakfast  cup yogurt, low fat, low sugar (1 carbohydrate serving)   cup cereal, ready-to-eat, unsweetened (1 carbohydrate serving)  1 cup strawberries (1 carbohydrate serving)   cup almonds ( carbohydrate serving)   Lunch 1, 5 ounce can chunk light tuna  2 ounces cheese, low fat cheddar  6 whole wheat crackers (1 carbohydrate serving)  1 small apple (1 carbohydrate servings)   cup carrots ( carbohydrate serving)   cup snap peas  1 cup 1% milk (1 carbohydrate serving)   Evening Meal Stir fry made with: 3 ounces chicken  1 cup brown rice (3 carbohydrate servings)   cup broccoli ( carbohydrate serving)   cup green beans   cup onions  1 tablespoon olive oil  2 tablespoons teriyaki sauce ( carbohydrate serving)  Evening Snack 1 extra small banana (1 carbohydrate serving)  1 tablespoon peanut butter   Carbohydrate Counting for People with Diabetes Vegan Sample 1-Day Menu  Breakfast 1 cup cooked oatmeal (2 carbohydrate servings)   cup blueberries (1 carbohydrate serving)  2 tablespoons flaxseeds  1 cup soymilk fortified with calcium and vitamin D  1 cup coffee  Lunch 2 slices whole wheat bread (2 carbohydrate servings)   cup baked tofu   cup lettuce  2 slices tomato  2 slices avocado     cup baby carrots ( carbohydrate serving)  1 orange (1 carbohydrate serving)  1 cup soymilk fortified with calcium and vitamin D   Evening Meal Burrito made with: 1 6-inch corn tortilla (1 carbohydrate serving)  1 cup refried vegetarian beans (2 carbohydrate servings)   cup chopped tomatoes   cup lettuce   cup salsa  1/3 cup brown rice (1 carbohydrate serving)  1 tablespoon olive oil for rice   cup zucchini   Evening Snack 6 small whole grain crackers (1 carbohydrate serving)  2 apricots ( carbohydrate serving)   cup unsalted peanuts ( carbohydrate serving)    Carbohydrate Counting for People with Diabetes Vegetarian (Lacto-Ovo) Sample 1-Day Menu  Breakfast 1 cup cooked oatmeal (2 carbohydrate servings)   cup blueberries (1 carbohydrate serving)  2 tablespoons flaxseeds  1 egg  1 cup 1% milk (1 carbohydrate serving)  1 cup coffee  Lunch 2 slices whole wheat bread (2 carbohydrate  servings)  2 ounces low-fat cheese   cup lettuce  2 slices tomato  2 slices avocado   cup baby carrots ( carbohydrate serving)  1 orange (1 carbohydrate serving)  1 cup unsweetened tea  Evening Meal Burrito made with: 1 6-inch corn tortilla (1 carbohydrate serving)   cup refried vegetarian beans (1 carbohydrate serving)   cup tomatoes   cup lettuce   cup salsa  1/3 cup brown rice (1 carbohydrate serving)  1 tablespoon olive oil for rice   cup zucchini  1 cup 1% milk (1 carbohydrate serving)  Evening Snack 6 small whole grain crackers (1 carbohydrate serving)  2 apricots ( carbohydrate serving)   cup unsalted peanuts ( carbohydrate serving)    Copyright 2020  Academy of Nutrition and Dietetics. All rights reserved.  Using Nutrition Labels: Carbohydrate  Serving Size  Look at the serving size. All the information on the label is based on this portion. Servings Per Container  The number of servings contained in the package. Guidelines for Carbohydrate  Look at the total grams of carbohydrate in the serving size.  1 carbohydrate choice = 15 grams of carbohydrate. Range of Carbohydrate Grams Per Choice  Carbohydrate Grams/Choice Carbohydrate Choices  6-10   11-20 1  21-25 1  26-35 2  36-40 2  41-50 3  51-55 3  56-65 4  66-70 4  71-80 5    Copyright 2020  Academy of Nutrition and Dietetics. All rights reserved.   Plate Method for Diabetes   Foods with carbohydrates make your blood glucose level go up. The plate method is a simple way to meal plan and control the amount of carbohydrate you eat.         Use the following guidance to build a healthy plate to control carbohydrates. Divide a 9-inch plate into 3 sections, and consider your beverage the 4th section of your meal: Food Group Examples of Foods/Beverages for This Section of your Meal  Section 1: Non-starchy vegetables Fill  of your plate to include non-starchy vegetables Asparagus, broccoli,  brussels sprouts, cabbage, carrots, cauliflower, celery, cucumber, green beans, mushrooms, peppers, salad greens, tomatoes, or zucchini.  Section 2: Protein foods Fill  of your plate to include a lean protein Lean meat, poultry, fish, seafood, cheese, eggs, lean deli meat, tofu, beans, lentils, nuts or nut butters.  Section 3: Carbohydrate foods Fill  of your plate to include carbohydrate foods Whole grains, whole wheat bread, brown rice, whole grain pasta, polenta, corn tortillas, fruit, or starchy vegetables (potatoes, green peas, corn, beans,  acorn squash, and butternut squash). One cup of milk also counts as a food that contains carbohydrate.  Section 4: Beverage Choose water or a low-calorie drink for your beverage. Unsweetened tea, coffee, or flavored/sparkling water without added sugar.  Image reprinted with permission from The American Diabetes Association.  Copyright 2022 by the American Diabetes Association.   Copyright 2022  Academy of Nutrition and Dietetics. All rights reserved

## 2022-08-21 NOTE — Progress Notes (Signed)
[facility]  PROGRESS NOTE  William [name] ZSW:[id number] DOB: Feb 22, 1961 DOA: 08/16/2022 PCP: Pcp, No   Brief HPI:     62 y.o. M with DM, HTN, MO, as well as recent osteomyelitis of the left great toe status post amputation, who was transferred from outside hospital for prostatic abscess.2 weeks prior to admission patient diagnosed with prostatitis, treated with Cipro for MSSA. 1 week ago, patient presented to outside hospital with atrial fibrillation, sepsis symptomology, found to have MSSA bacteremia, iliopsoas and prostatic abscesses.   Patient was transferred to [facility], urology and IR was consulted.  Patient was taken to the OR for prostatic abscess treatment.  ID was consulted.  Subjective   Patient seen and examined, denies pain.   Assessment/Plan:   MSSA bacteremia -Patient had positive blood culture at [facility] ER for MSSA in 2 out of 2 bottles -Blood culture on 1/13 drawn here was negative to date -Source likely due to ulcer with dissemination to prostate/psoas/vertebral -TTE was negative for vegetation -Given psoas involvement, so presumed to have lumbar osteomyelitis with spillover into psoas -ID anticipates 6 weeks of treatment with IV antibiotics; plan for TEE  Bilateral psoas fluid collection -CT abdomen/pelvis obtained today shows bilateral iliopsoas fluid collection, right-sided collection slightly larger as compared to left -IR consulted for CT-guided aspiration and possible drainage of fluid collection  Prostatic abscess S/p TURP, prostate abscess unroofing 1/14 by Dr. Louis Meckel - will discontinue foley as per urology recommendation   Osteomyelitis of left foot  S/p LEFT great toe partial amputation on 1/11 by Dr. Rocco Serene up at [facility] Discussed with Dr. Rocco Serene, surgeon 1/15 by phone Changed post-op dressing on 1/15, toe looks good, no drainage, dehiscence   Dr. Rocco Serene recommended: - Change dressing daily -- xeroform, wrapped in gauze,  covered in ACE wrap - WBAT in post op shoe - After two weeks (so around Jan 25) stop Xeroform in dressing and use adaptic - Leave sutures in place for 1 month (remove Feb 11) - Follow up with Dr. Rocco Serene in [place] within 1 month, preferably within a week of discharge to home or SNF  Hyperthyroidism Abnormal thyroid testing at OSH and briefly on PTU there.   Repeat TSH here normal -T3- 65, free T4- 1.67 -Follow-up endocrinology as outpatient   Hyponatremia Mild   Hypokalemia - replete   Type 2 diabetes mellitus (HCC) Glucoses controlled, A1c >12% - Continue Ss corrections   Severe obstructive sleep apnea - CPAP at night   Paroxysmal atrial fibrillation ([place]) New diagnosis at last admission.  CHA2DS2-Vasc 1.   - Defer anticoagulation for now given low CHADS - Monitor on tele     Normocytic anemia Hgb 10 in setting of recent orthopedic surgery   Morbid obesity ([place]) BMI 47, unclear if he has malnutrition, consult dietitian   Hypercholesteremia Not on statin       Medications     Chlorhexidine Gluconate Cloth  6 each Topical Daily   insulin aspart  0-15 Units Subcutaneous TID WC   multivitamin with minerals  1 tablet Oral Daily   Ensure Max Protein  11 oz Oral BID   senna-docusate  2 tablet Oral BID   tamsulosin  0.4 mg Oral Daily     Data Reviewed:   CBG:  Recent Labs  Lab 08/20/22 1612 08/20/22 2115 08/21/22 0725 08/21/22 1123 08/21/22 1615  GLUCAP 193* 214* 261* 233* 257*    SpO2: 97 % O2 Flow Rate (L/min): 3 L/min    Vitals:  08/20/22 2113 08/21/22 0607 08/21/22 0808 08/21/22 1341  BP: 121/79 (!) 172/96 (!) 154/87 119/81  Pulse: 94 89 92 96  Resp: 16 16 18 20   Temp: (!) 97.5 F (36.4 C) 98.2 F (36.8 C) 98 F (36.7 C) 97.7 F (36.5 C)  TempSrc: Oral Oral Oral Oral  SpO2: 93% 98% 97% 97%      Data Reviewed:  Basic Metabolic Panel: Recent Labs  Lab 08/17/22 0115 08/18/22 0511 08/19/22 1025 08/20/22 0543 08/21/22 1043  NA  136 136 136 133* 132*  K 3.1* 3.3* 3.6 3.1* 3.5  CL 104 104 102 99 94*  CO2 26 25 27 27 30   GLUCOSE 99 106* 195* 227* 248*  BUN 15 12 12 11 11   CREATININE 0.95 0.79 0.89 0.78 0.72  CALCIUM 7.4* 7.5* 7.6* 7.4* 7.6*  MG 2.1  --   --   --   --     CBC: Recent Labs  Lab 08/17/22 0115 08/18/22 0511 08/19/22 1025 08/20/22 0543 08/21/22 1043  WBC 18.5* 17.6* 17.8* 14.6* 14.3*  NEUTROABS 14.0*  --   --   --   --   HGB 10.8* 10.5* 10.5* 10.5* 10.6*  HCT 34.1* 32.9* 34.1* 33.3* 33.1*  MCV 85.0 83.7 85.9 83.0 82.3  PLT 196 219 231 252 312    LFT Recent Labs  Lab 08/17/22 0115 08/19/22 1025 08/20/22 0543  AST 41 28 29  ALT 30 30 26   ALKPHOS 121 352* 322*  BILITOT 0.5 0.5 0.7  PROT 5.2* 5.6* 5.3*  ALBUMIN 1.6* 1.7* 1.7*     Antibiotics: Anti-infectives (From admission, onward)    Start     Dose/Rate Route Frequency Ordered Stop   08/18/22 2200  ceFAZolin (ANCEF) IVPB 2g/100 mL premix        2 g 200 mL/hr over 30 Minutes Intravenous Every 8 hours 08/18/22 1712     08/18/22 0200  cefTRIAXone (ROCEPHIN) 2 g in sodium chloride 0.9 % 100 mL IVPB  Status:  Discontinued        2 g 200 mL/hr over 30 Minutes Intravenous Every 24 hours 08/17/22 1304 08/18/22 1712   08/17/22 1400  vancomycin (VANCOREADY) IVPB 1750 mg/350 mL  Status:  Discontinued        1,750 mg 175 mL/hr over 120 Minutes Intravenous Every 12 hours 08/17/22 0049 08/18/22 1712   08/17/22 0130  cefTRIAXone (ROCEPHIN) 1 g in sodium chloride 0.9 % 100 mL IVPB  Status:  Discontinued        1 g 200 mL/hr over 30 Minutes Intravenous Every 24 hours 08/17/22 0039 08/17/22 1304   08/17/22 0130  vancomycin (VANCOREADY) IVPB 2000 mg/400 mL        2,000 mg 200 mL/hr over 120 Minutes Intravenous  Once 08/17/22 0042 08/17/22 0418        DVT prophylaxis: SCDs  Code Status: Full code  Family Communication: No family at bedside   CONSULTS    Objective    Physical Examination:   General-appears in no acute  distress Heart-S1-S2, regular, no murmur auscultated Lungs-clear to auscultation bilaterally, no wheezing or crackles auscultated Abdomen-soft, nontender, no organomegaly Extremities-no edema in the lower extremities Neuro-alert, oriented x3, no focal deficit noted  Status is: Inpatient:             Oswald Hillock   [facility] If 7PM-7AM, please contact night-coverage at [email or web address], Office  [phone]   08/21/2022, 5:35 PM  LOS: 5 days

## 2022-08-21 NOTE — TOC Initial Note (Addendum)
Transition of Care [facility]) - Initial/Assessment Note    Patient Details  Name: William [name] MRN: [id number] Date of Birth: [birth year]  Transition of [facility]) CM/SW Contact:    Angelita Ingles, RN Phone Number:[phone]  08/21/2022, 4:09 PM  Clinical Narrative:                 Transition of Care [facility]) Screening Note   Patient Details  Name: William [name] Date of Birth: 1961/03/14   Transition of Care [facility]) CM/SW Contact:    Angelita Ingles, RN Phone Number: 08/21/2022, 4:10 PM    Transition of Care Department [facility]) has reviewed patient and no TOC needs have been identified at this time. We will continue to monitor patient advancement through interdisciplinary progression rounds. TOC following for possible disposition needs.           Patient Goals and CMS Choice            Expected Discharge Plan and Services                                              Prior Living Arrangements/Services                       Activities of Daily Living Home Assistive Devices/Equipment: None ADL Screening (condition at time of admission) Patient's cognitive ability adequate to safely complete daily activities?: Yes Is the patient deaf or have difficulty hearing?: No Does the patient have difficulty seeing, even when wearing glasses/contacts?: No Does the patient have difficulty concentrating, remembering, or making decisions?: No Patient able to express need for assistance with ADLs?: Yes Does the patient have difficulty dressing or bathing?: Yes Independently performs ADLs?: No Communication: Independent, Appropriate for developmental age Dressing (OT): Needs assistance Is this a change from baseline?: Change from baseline, expected to last <3days Grooming: Needs assistance Is this a change from baseline?: Change from baseline, expected to last <3 days Feeding: Independent Bathing: Needs assistance Is this a change from baseline?: Change from  baseline, expected to last <3 days Toileting: Needs assistance Is this a change from baseline?: Change from baseline, expected to last <3 days In/Out Bed: Dependent Is this a change from baseline?: Change from baseline, expected to last <3 days Walks in Home: Independent Does the patient have difficulty walking or climbing stairs?: Yes Weakness of Legs: Both Weakness of Arms/Hands: Both  Permission Sought/Granted                  Emotional Assessment              Admission diagnosis:  Prostatic abscess [N41.2] Patient Active Problem List   Diagnosis Date Noted   Hyponatremia 08/20/2022   Hyperthyroidism 08/19/2022   MSSA bacteremia 08/18/2022   Hypokalemia 08/18/2022   Prostatic abscess 08/17/2022   Normocytic anemia 08/17/2022   Type 2 diabetes mellitus ([place]) 08/17/2022   Possible psoas muscle infection 08/17/2022   AKI (acute kidney injury) ([place]) 08/12/2022   Paroxysmal atrial fibrillation ([place]) 08/12/2022   DKA (diabetic ketoacidosis) ([place]) 08/12/2022   Leucocytosis 08/12/2022   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   Osteomyelitis of left foot ([place]) 08/12/2022   Sepsis with organ dysfunction ([place]) 08/12/2022   Severe obstructive sleep apnea 08/12/2022   Acute prostatitis 08/09/2022   Urinary tract infection without hematuria 08/09/2022   Diabetic ulcer of toe of left  foot associated with type 2 diabetes mellitus, with fat layer exposed ([place]) 09/07/2021   Diabetes mellitus with hyperglycemia, without long-term current use of insulin ([place]) 05/23/2016   Hypercholesteremia 05/23/2016   Morbid obesity ([place]) 05/23/2016   PCP:  Merryl Hacker No Pharmacy:   [facility] [address], [place] [address] Phone: [phone] Fax: [phone]     Social Determinants of Health (SDOH) Social History: [place]: No Food Insecurity (08/17/2022)  Housing: Low Risk  (08/17/2022)  Transportation Needs: No Transportation  Needs (08/17/2022)  Utilities: Not At Risk (08/17/2022)  Tobacco Use: Low Risk  (08/19/2022)   SDOH Interventions:     Readmission Risk Interventions     No data to display

## 2022-08-21 NOTE — Consult Note (Signed)
Chief Complaint: Patient was seen in consultation today for CT guided aspiration/possible drainage of bilateral psoas fluid collections  Referring Physician(s): Lama,G  Supervising Physician: Markus Daft  Patient Status: [facility] - In-pt  History of Present Illness: William [name] is a 62 y.o. 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  2 weeks prior to admission patient was diagnosed with prostatitis and treated with Cipro for MSSA.  1 week ago he presented to [facility] with atrial fibrillation, sepsis, MSSA bacteremia as well as iliopsoas and prostatic abscesses.  On 1/15 patient underwent prostate abscess unroofing with transurethral resection of the prostate.  IR was consulted for possible psoas abscess aspiration/drain on 1/14.  Imaging studies were reviewed at that time by Dr. Kathlene Cote who felt that areas of concern were too small and not in location which were amenable to safely aspirate and drain.  IV antibiotics and F/U CT were recommended.  Latest CT of abdomen pelvis performed today revealed:  1. Significant improvement in the appearance of the prostate gland after surgical unroofing with near resolution of abscesses. There remains a small left-sided intra prostate fluid collection measuring up to 1.9 cm in transverse diameter. 2. Bilateral iliopsoas fluid collections remain. The right-sided collection is likely slightly larger compared to the prior study. The left-sided iliopsoas collection is similar to slightly enlarged compared to the prior study.   Latest imaging was reviewed by Dr. Pascal Lux today and plans are now to proceed with CT-guided aspiration and possible drainage of bilateral psoas fluid collections on 1/18.  Patient is currently afebrile, WBC 14.3,  hemoglobin 10.6, platelets normal, creatinine normal, PT 16.5, INR 1.4.  Not anticoagulated.  Urine culture from days ago revealed Staph aureus.  Latest blood cultures negative to date.  He is on IV Ancef.   Past Medical History:  Diagnosis Date   Diabetes mellitus, type II ([place])    Hyperlipidemia    Hypertension     Past Surgical History:  Procedure Laterality Date   LEG SURGERY Right 2021   TRANSURETHRAL RESECTION OF PROSTATE N/A 08/18/2022   Procedure: PROSTATE ABSCESS UNROOFING, TRANSURETHRAL RESECTION OF THE PROSTATE (TURP);  Surgeon: Ardis Hughs, MD;  Location: [facility] ORS;  Service: Urology;  Laterality: N/A;    Allergies: Diltiazem hcl  Medications: Prior to Admission medications   Medication Sig Start Date End Date Taking? Authorizing Provider  albuterol (ACCUNEB) 1.25 MG/3ML nebulizer solution Take 1 ampule by nebulization every 6 (six) hours as needed for shortness of breath or wheezing. 07/21/22  Yes [provider]  ciprofloxacin (CIPRO) 500 MG tablet Take 1 tablet (500 mg total) by mouth 2 (two) times daily for 14 days. 08/09/22 08/23/22 Yes Stoneking, Reece Leader., MD  meloxicam (MOBIC) 7.5 MG tablet Take 1 tablet (7.5 mg total) by mouth daily. 08/09/22  Yes Stoneking, Reece Leader., MD  oxyCODONE (OXY IR/ROXICODONE) 5 MG immediate release tablet Take 1 tablet (5 mg total) by mouth every 6 (six) hours as needed for severe pain. 08/09/22  Yes Stoneking, Reece Leader., MD  tamsulosin (FLOMAX) 0.4 MG CAPS capsule Take 1 capsule (0.4 mg total) by mouth daily. 08/09/22  Yes Stoneking, Reece Leader., MD     Family History  Problem Relation Age of Onset   Thyroid disease Mother    Hypertension Mother    Diabetes Father  Hypertension Father    Hypertension Brother     Social History   Socioeconomic History   Marital status: Married    Spouse name: Not on file   Number of children: Not on file   Years of education: Not on file   Highest education level: Not on file   Occupational History   Not on file  Tobacco Use   Smoking status: Never   Smokeless tobacco: Never  Vaping Use   Vaping Use: Never used  Substance and Sexual Activity   Alcohol use: No   Drug use: No   Sexual activity: Not on file  Other Topics Concern   Not on file  Social History Narrative   Not on file   Social Determinants of Health   Financial Resource Strain: Not on file  Food Insecurity: No Food Insecurity (08/17/2022)   Hunger Vital Sign    Worried About Running Out of Food in the Last Year: Never true    Ran Out of Food in the Last Year: Never true  Transportation Needs: No Transportation Needs (08/17/2022)   PRAPARE - Administrator, Civil Service (Medical): No    Lack of Transportation (Non-Medical): No  Physical Activity: Not on file  Stress: Not on file  Social Connections: Not on file      Review of Systems patient denies fever, headache, chest pain, worsening dyspnea, cough, abd pain, nausea, vomiting or bleeding.  He does have left greater than right flank discomfort  Vital Signs: BP 119/81 (BP Location: Right Arm)   Pulse 96   Temp 97.7 F (36.5 C) (Oral)   Resp 20   SpO2 97%     Physical Exam awake, alert.  Chest clear to auscultation bilaterally anteriorly.  Heart with irregularly irregular rhythm; abdomen obese, soft, positive bowel sounds, nontender.left foot with bandage in place, tr pretibial edema on left, no sig RLE edema  Imaging: CT ABDOMEN PELVIS W CONTRAST  Result Date: 08/21/2022 CLINICAL DATA:  Left lower quadrant and flank pain and known bilateral intramuscular iliopsoas abscesses and status post surgical unroofing bilateral prostate abscesses on 08/18/2022. EXAM: CT ABDOMEN AND PELVIS WITH CONTRAST TECHNIQUE: Multidetector CT imaging of the abdomen and pelvis was performed using the standard protocol following bolus administration of intravenous contrast. RADIATION DOSE REDUCTION: This exam was performed according to the  departmental dose-optimization program which includes automated exposure control, adjustment of the mA and/or kV according to patient size and/or use of iterative reconstruction technique. CONTRAST:  OMNIPAQUE IOHEXOL 300 MG/ML  SOLN COMPARISON:  Prior CT of the abdomen and pelvis on 08/17/2022 FINDINGS: Lower chest: No acute abnormality. Hepatobiliary: No focal liver abnormality is seen. No gallstones, gallbladder wall thickening, or biliary dilatation. Pancreas: Stable atrophic pancreas without inflammation. Spleen: Normal in size without focal abnormality. Adrenals/Urinary Tract: Adrenal glands are unremarkable. Kidneys are normal, without renal calculi, focal lesion, or hydronephrosis. Bladder is decompressed by a Foley catheter. Stomach/Bowel: Bowel shows no evidence of obstruction, ileus, inflammation or lesion. The appendix is not visualized. No free intraperitoneal air. Vascular/Lymphatic: No vascular findings. No lymphadenopathy identified in the abdomen or pelvis. Reproductive: Prostate gland shows significant improvement after unroofing with near resolution of abscesses. There remains a small left sided intra prostate fluid collection measuring up to 1.9 cm in transverse diameter. Other collections appear resolved. Other: Bilateral intramuscular ileo psoas collections remain. The elongated right-sided collection measures approximately 4.2 x 3.0 cm in greatest transverse dimensions at the level of the S1 vertebral body  which is likely slightly larger compared to comparable measurements obtained currently 3.8 x 2.9 cm at the same level. At least 2 separate left-sided iliopsoas collections remain with the largest measuring roughly 4.1 cm in greatest diameter at the level of the mid sacrum. This collection appeared similar in size measuring approximately 4 cm at the same level on the prior study. Superiorly the collection may be slightly larger in diameter measuring 2.5 cm at the level of the L5  vertebral body compared to 1.9 cm previously. Musculoskeletal: No fractures or bony destruction. Stable degenerative disc disease in the lower lumbar spine. IMPRESSION: 1. Significant improvement in the appearance of the prostate gland after surgical unroofing with near resolution of abscesses. There remains a small left-sided intra prostate fluid collection measuring up to 1.9 cm in transverse diameter. 2. Bilateral iliopsoas fluid collections remain. The right-sided collection is likely slightly larger compared to the prior study. The left-sided iliopsoas collection is similar to slightly enlarged compared to the prior study. Electronically Signed   By: Aletta Edouard M.D.   On: 08/21/2022 11:16   DG CHEST PORT 1 VIEW  Result Date: 08/19/2022 CLINICAL DATA:  Central line placement EXAM: PORTABLE CHEST 1 VIEW COMPARISON:  08/13/2022 FINDINGS: The heart size and mediastinal contours are within normal limits. Unchanged appearance of a right neck vascular catheter, tip over the lower SVC. Diffuse bilateral interstitial pulmonary opacity. The visualized skeletal structures are unremarkable. IMPRESSION: 1. Unchanged appearance of a right neck vascular catheter, tip over the lower SVC. 2. Diffuse bilateral interstitial pulmonary opacity, consistent with edema or atypical/viral infection. No new or focal airspace opacity. Electronically Signed   By: Delanna Ahmadi M.D.   On: 08/19/2022 15:50   CT ABDOMEN PELVIS W WO CONTRAST  Result Date: 08/18/2022 CLINICAL DATA:  62 year old male history of prostate abscess. Psoas abscess. EXAM: CT ABDOMEN AND PELVIS WITHOUT AND WITH CONTRAST TECHNIQUE: Multidetector CT imaging of the abdomen and pelvis was performed following the standard protocol before and following the bolus administration of intravenous contrast. RADIATION DOSE REDUCTION: This exam was performed according to the departmental dose-optimization program which includes automated exposure control, adjustment of  the mA and/or kV according to patient size and/or use of iterative reconstruction technique. CONTRAST:  12mL OMNIPAQUE IOHEXOL 300 MG/ML  SOLN COMPARISON:  CT of the abdomen and pelvis without contrast 08/06/2022. FINDINGS: Comment: Suboptimal contrast bolus slightly limits today's examination. Lower chest: Tip of central venous catheter at superior cavoatrial junction. Hepatobiliary: Diffuse low attenuation throughout the hepatic parenchyma, indicative of a background of severe hepatic steatosis. No suspicious cystic or solid hepatic lesions. No intra or extrahepatic biliary ductal dilatation. Gallbladder is unremarkable in appearance. Pancreas: Diffuse pancreatic atrophy. No pancreatic mass. No pancreatic ductal dilatation. No pancreatic or peripancreatic fluid collections or inflammatory changes. Spleen: Unremarkable. Adrenals/Urinary Tract: Bilateral kidneys and adrenal glands are normal in appearance. No hydroureteronephrosis. Urinary bladder is nearly completely decompressed around an indwelling Foley balloon catheter. Small amount of gas within the lumen of the urinary bladder is presumably iatrogenic. Stomach/Bowel: The appearance of the stomach is unremarkable. No pathologic dilatation of small bowel or colon. Diffuse scattered colonic diverticuli are noted without surrounding inflammatory changes to indicate an acute diverticulitis at this time. Normal appendix. Vascular/Lymphatic: No atherosclerotic calcifications are noted in the abdominal aorta or pelvic vasculature. No definite lymphadenopathy noted in the abdomen or pelvis. Reproductive: Prostate gland is massively enlarged and very heterogeneous in appearance. There are enlarging low-attenuation regions in the prostate gland, largest of which  is on the left side (axial image 97 of series 4) measuring 4.6 x 4.1 cm, concerning for multifocal prostatic abscesses. Seminal vesicles are unremarkable in appearance. Other: No significant volume of ascites.   No pneumoperitoneum. Musculoskeletal: Compared to the prior examination there are new space-occupying areas of low attenuation in the psoas musculature bilaterally, concerning for psoas abscesses (poorly evaluated secondary to suboptimal contrast bolus), well appreciated on axial image 72 of series 4, particularly when narrowing the window settings. The largest of these is on the right side (axial image 72 of series 4 and coronal image 68 of series 8) estimated to measure approximately 3.2 x 2.8 x 6.8 cm. IMPRESSION: 1. Massively enlarged and heterogeneous appearing prostate gland with multiple rim enhancing low-attenuation areas, presumably prostatic abscesses, as detailed above. 2. Interval development of space-occupying low-attenuation regions in the psoas musculature bilaterally, concerning for psoas abscesses. 3. Hepatic steatosis. 4. Mild colonic diverticulosis without evidence of acute diverticulitis at this time. Electronically Signed   By: Trudie Reed M.D.   On: 08/18/2022 08:53   CT L-SPINE NO CHARGE  Result Date: 08/06/2022 CLINICAL DATA:  Left flank pain. EXAM: CT LUMBAR SPINE WITHOUT CONTRAST TECHNIQUE: Multidetector CT imaging of the lumbar spine was performed without intravenous contrast administration. Multiplanar CT image reconstructions were also generated. RADIATION DOSE REDUCTION: This exam was performed according to the departmental dose-optimization program which includes automated exposure control, adjustment of the mA and/or kV according to patient size and/or use of iterative reconstruction technique. COMPARISON:  None Available. FINDINGS: Segmentation: Assuming 12 rib-bearing thoracic vertebral bodies, there is transitional lumbosacral anatomy with incomplete sacralization of L5. Alignment: Straightening of the normal lumbar lordosis. Trace stepwise retrolisthesis from L1-L2 through L4-L5. Vertebrae: No acute fracture or focal pathologic process. Small Schmorl's nodes at L3-L4 and  L4-L5. Paraspinal and other soft tissues: Please see separate CT abdomen and pelvis report from same day. Disc levels: T12-L1: Minimal disc bulging. Moderate right and mild left facet arthropathy. No stenosis. L1-L2: Mild disc bulging and endplate spurring. Mild right facet arthropathy. No stenosis. L2-L3: Mild disc bulging and endplate spurring. Mild left facet arthropathy. No stenosis. L3-L4: Mild disc bulging and endplate spurring with left far lateral disc osteophyte complex. Mild bilateral facet arthropathy. Moderate to severe left and moderate right neuroforaminal stenosis. No spinal canal stenosis. L4-L5: Circumferential disc osteophyte complex. Mild-to-moderate bilateral facet arthropathy. Severe bilateral neuroforaminal stenosis. No spinal canal stenosis. L5-S1: Transitional level. Negative disc. Mild bilateral facet arthropathy. No stenosis. IMPRESSION: 1. 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 Correlation with radiographs is recommended prior to any operative intervention. 2. Lumbar spondylosis as described above. Severe bilateral neuroforaminal stenosis at L4-L5. 3. Moderate to severe left and moderate right neuroforaminal stenosis at L3-L4. Electronically Signed   By: Obie Dredge M.D.   On: 08/06/2022 11:18   CT Renal Stone Study  Result Date: 08/06/2022 CLINICAL DATA:  Left flank pain radiating to the mid back. Denies hematuria or history of kidney stones. EXAM: CT ABDOMEN AND PELVIS WITHOUT CONTRAST TECHNIQUE: Multidetector CT imaging of the abdomen and pelvis was performed following the standard protocol without IV contrast. RADIATION DOSE REDUCTION: This exam was performed according to the departmental dose-optimization program which includes automated exposure control, adjustment of the mA and/or kV according to patient size and/or use of iterative reconstruction technique. COMPARISON:  None Available. FINDINGS: Lower chest: No acute abnormality. Hepatobiliary:  Mild diffusely decreased liver density without focal abnormality. The gallbladder is unremarkable. No biliary dilatation. Pancreas: Fatty atrophy.  No ductal dilatation or surrounding inflammatory changes. Spleen: Mildly enlarged.  No focal abnormality. Adrenals/Urinary Tract: Adrenal glands are unremarkable. Kidneys are normal, without renal calculi, focal lesion, or hydronephrosis. Bladder is unremarkable. Stomach/Bowel: Stomach is within normal limits. Appendix appears normal. No evidence of bowel wall thickening, distention, or inflammatory changes. Vascular/Lymphatic: No significant vascular findings are present. No enlarged abdominal or pelvic lymph nodes. Reproductive: Enlarged prostate gland appears hypodense, although this may be accentuated by beam hardening artifact. Other: No abdominal wall hernia or abnormality. No abdominopelvic ascites. No pneumoperitoneum. Musculoskeletal: No acute or significant osseous findings. IMPRESSION: 1. No acute intra-abdominal process. No urolithiasis. 2. Mild hepatic steatosis. 3. Mild splenomegaly. 4. Enlarged prostate gland appears hypodense, although this may be accentuated by beam hardening artifact. Correlate for prostatitis. Electronically Signed   By: Obie Dredge M.D.   On: 08/06/2022 11:11    Labs:  CBC: Recent Labs    08/18/22 0511 08/19/22 1025 08/20/22 0543 08/21/22 1043  WBC 17.6* 17.8* 14.6* 14.3*  HGB 10.5* 10.5* 10.5* 10.6*  HCT 32.9* 34.1* 33.3* 33.1*  PLT 219 231 252 312    COAGS: Recent Labs    08/17/22 0115  INR 1.4*    BMP: Recent Labs    08/18/22 0511 08/19/22 1025 08/20/22 0543 08/21/22 1043  NA 136 136 133* 132*  K 3.3* 3.6 3.1* 3.5  CL 104 102 99 94*  CO2 25 27 27 30   GLUCOSE 106* 195* 227* 248*  BUN 12 12 11 11   CALCIUM 7.5* 7.6* 7.4* 7.6*  CREATININE 0.79 0.89 0.78 0.72  GFRNONAA >60 >60 >60 >60    LIVER FUNCTION TESTS: Recent Labs    08/06/22 0958 08/17/22 0115 08/19/22 1025 08/20/22 0543   BILITOT 1.1 0.5 0.5 0.7  AST 23 41 28 29  ALT 21 30 30 26   ALKPHOS 81 121 352* 322*  PROT 7.3 5.2* 5.6* 5.3*  ALBUMIN 2.8* 1.6* 1.7* 1.7*    TUMOR MARKERS: No results for input(s): "AFPTM", "CEA", "CA199", "CHROMGRNA" in the last 8760 hours.  Assessment and Plan: 62 y.o. male with past medical history of diabetes, sleep apnea, hypertension, obesity and recent osteomyelitis of the left great toe status post amputation who was transferred to [facility] on 1/12 from [facility] for prostatic abscess.  2 weeks prior to admission patient was diagnosed with prostatitis and treated with Cipro for MSSA.  1 week ago he presented to [facility] with atrial fibrillation, sepsis, MSSA bacteremia as well as iliopsoas and prostatic abscesses.  On 1/15 patient underwent prostate abscess unroofing with transurethral resection of the prostate.  IR was consulted for possible psoas abscess aspiration/drain on 1/14.  Imaging studies were reviewed at that time by Dr. [facility] who felt that areas of concern were too small and not in location which were amenable to safely aspirate and drain.  IV antibiotics and F/U CT were recommended.  Latest CT of abdomen pelvis performed today revealed:  1. Significant improvement in the appearance of the prostate gland after surgical unroofing with near resolution of abscesses. There remains a small left-sided intra prostate fluid collection measuring up to 1.9 cm in transverse diameter. 2. Bilateral iliopsoas fluid collections remain. The right-sided collection is likely slightly larger compared to the prior study. The left-sided iliopsoas collection is similar to slightly enlarged compared to the prior study.   Latest imaging was reviewed by Dr. 2/15 today and plans are now to proceed with CT-guided aspiration and possible drainage of bilateral psoas fluid  collections on 1/18.  Patient is currently afebrile, WBC 14.3, hemoglobin 10.6, platelets  normal, creatinine normal, PT 16.5, INR 1.4.  Not anticoagulated.  Urine culture from days ago revealed Staph aureus.  Latest blood cultures negative to date.  He is on IV Ancef.  Details/risks of above procedures, including but not limited to, internal bleeding, infection, injury to adjacent structures, inability to leave drain discussed with the patient with his understanding and consent.   Thank you for this interesting consult.  I greatly enjoyed meeting [address] and look forward to participating in their care.  A copy of this report was sent to the requesting provider on this date.  Electronically Signed: D. Jeananne Rama, PA-C 08/21/2022, 2:02 PM   I spent a total of  25 minutes   in face to face in clinical consultation, greater than 50% of which was counseling/coordinating care for CT-guided aspiration/possible drainage of bilateral psoas fluid collections

## 2022-08-21 NOTE — Progress Notes (Signed)
No staff to move patient to CT table. Planning for AM study

## 2022-08-22 ENCOUNTER — Inpatient Hospital Stay: Payer: Self-pay

## 2022-08-22 ENCOUNTER — Inpatient Hospital Stay (HOSPITAL_COMMUNITY): Payer: Commercial Managed Care - PPO

## 2022-08-22 DIAGNOSIS — B9561 Methicillin susceptible Staphylococcus aureus infection as the cause of diseases classified elsewhere: Secondary | ICD-10-CM | POA: Diagnosis not present

## 2022-08-22 DIAGNOSIS — K6812 Psoas muscle abscess: Secondary | ICD-10-CM | POA: Diagnosis not present

## 2022-08-22 DIAGNOSIS — M86072 Acute hematogenous osteomyelitis, left ankle and foot: Secondary | ICD-10-CM | POA: Diagnosis not present

## 2022-08-22 DIAGNOSIS — E871 Hypo-osmolality and hyponatremia: Secondary | ICD-10-CM

## 2022-08-22 DIAGNOSIS — N412 Abscess of prostate: Secondary | ICD-10-CM | POA: Diagnosis not present

## 2022-08-22 DIAGNOSIS — R7881 Bacteremia: Secondary | ICD-10-CM | POA: Diagnosis not present

## 2022-08-22 LAB — CULTURE, BLOOD (ROUTINE X 2)
Culture: NO GROWTH
Culture: NO GROWTH
Special Requests: ADEQUATE

## 2022-08-22 LAB — GLUCOSE, CAPILLARY
Glucose-Capillary: 242 mg/dL — ABNORMAL HIGH (ref 70–99)
Glucose-Capillary: 189 mg/dL — ABNORMAL HIGH (ref 70–99)
Glucose-Capillary: 150 mg/dL — ABNORMAL HIGH (ref 70–99)
Glucose-Capillary: 229 mg/dL — ABNORMAL HIGH (ref 70–99)

## 2022-08-22 MED ORDER — MIDAZOLAM HCL 2 MG/2ML IJ SOLN
INTRAMUSCULAR | Status: AC
  Filled 2022-08-22: qty 2

## 2022-08-22 MED ORDER — FENTANYL CITRATE (PF) 100 MCG/2ML IJ SOLN
INTRAMUSCULAR | Status: AC
  Filled 2022-08-22: qty 2

## 2022-08-22 MED ORDER — SODIUM CHLORIDE 0.9% FLUSH
5.0000 mL | Freq: Three times a day (TID) | INTRAVENOUS | Status: DC
  Administered 2022-08-22 – 2022-09-04 (×36): 5 mL

## 2022-08-22 MED ORDER — LIDOCAINE HCL (PF) 1 % IJ SOLN
INTRAMUSCULAR | Status: AC | PRN
  Administered 2022-08-22 (×2): 10 mL

## 2022-08-22 MED ORDER — MIDAZOLAM HCL 2 MG/2ML IJ SOLN
INTRAMUSCULAR | Status: AC | PRN
  Administered 2022-08-22 (×3): 1 mg via INTRAVENOUS

## 2022-08-22 MED ORDER — MIDAZOLAM HCL 2 MG/2ML IJ SOLN
INTRAMUSCULAR | Status: AC
  Filled 2022-08-22: qty 4

## 2022-08-22 MED ORDER — FENTANYL CITRATE (PF) 100 MCG/2ML IJ SOLN
INTRAMUSCULAR | Status: AC
  Filled 2022-08-22: qty 4

## 2022-08-22 MED ORDER — FENTANYL CITRATE (PF) 100 MCG/2ML IJ SOLN
INTRAMUSCULAR | Status: AC | PRN
  Administered 2022-08-22 (×3): 50 ug via INTRAVENOUS

## 2022-08-22 MED ORDER — NALOXONE HCL 0.4 MG/ML IJ SOLN
INTRAMUSCULAR | Status: AC
  Filled 2022-08-22: qty 1

## 2022-08-22 MED ORDER — FLUMAZENIL 0.5 MG/5ML IV SOLN
INTRAVENOUS | Status: AC
  Filled 2022-08-22: qty 5

## 2022-08-22 NOTE — Progress Notes (Signed)
Physical Therapy Treatment Patient Details Name: William [name] MRN: [id number] DOB: 24-Feb-1961 Today's Date: 08/22/2022   History of Present Illness Patient is 62 yo male admitted 08/16/22  for evaluation of persistent left-sided low back pain and pain along the suprapubic region , treated for dysuria recently, S/P PROSTATE ABSCESS UNROOFING, TRANSURETHRAL RESECTION OF THE PROSTATE. PMH: right great toe amputation,DM,HTN.previous history:On 08/12/2022 the patient was admitted to the outside hospital with DKA a and A-fib with RVR.  He was complaining of weakness shortness of breath.  He was noted to be hypoxic and his blood sugar was over 500.  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  Further evaluation with CT scan subsequently demonstrated possible prostatic abscess and psoas abscess on the left.    PT Comments    Kreg tilt bed representative here to assess bed size to patient and assist with initial tilting patient  to begin WB. Bed straps placed and patient tilted to 25* x 3 ", then to 40* but did not tolerate(reported left foot and back discomfort). Back to 25* x 8 minutes . Placed in semi chair position.  Patient indicating that his back is uncomfortable. Patient is scheduled for IR drainage of abscesses today.    Continue tilting to stand/WB to  ensure safety as bilateral legs are weak and not strong enough for standing from bed at this time.  Recommendations for follow up therapy are one component of a multi-disciplinary discharge planning process, led by the attending physician.  Recommendations may be updated based on patient status, additional functional criteria and insurance authorization.  Follow Up Recommendations  Acute inpatient rehab (3hours/day) Can patient physically be transported by private vehicle: No   Assistance Recommended at Discharge Frequent or constant Supervision/Assistance  Patient can  return home with the following Two people to help with walking and/or transfers;A lot of help with bathing/dressing/bathroom;Assist for transportation;Help with stairs or ramp for entrance   Equipment Recommendations  None recommended by PT    Recommendations for Other Services Rehab consult     Precautions / Restrictions Precautions Precautions: Fall Precaution Comments: , bariatric, profound weakness of legs, ? due to abcesses in liiopsoas L > R, recent gr. toe amp on  Left     Mobility  Bed Mobility               General bed mobility comments: worked on tilt bed/ standing Start Time: 1141 Angle: 25 degrees Total Minutes in Angle: 10 minutes  Transfers                   General transfer comment: will need mechanical lift    Ambulation/Gait                   Stairs             Wheelchair Mobility    Modified Rankin (Stroke Patients Only)       Balance       Sitting balance - Comments: NT this visit                                    Cognition Arousal/Alertness: Awake/alert Behavior During Therapy: WFL for tasks assessed/performed Overall Cognitive Status: Within Functional Limits for tasks assessed  Exercises      General Comments        Pertinent Vitals/Pain Pain Assessment Faces Pain Scale: Hurts even more Pain Location: back when more flat, left foot Pain Descriptors / Indicators: Grimacing, Guarding, Discomfort Pain Intervention(s): Monitored during session, Repositioned    Home Living                          Prior Function            PT Goals (current goals can now be found in the care plan section) Progress towards PT goals: Progressing toward goals    Frequency    Min 3X/week      PT Plan Current plan remains appropriate    Co-evaluation              AM-PAC PT "6 Clicks" Mobility   Outcome Measure  Help  needed turning from your back to your side while in a flat bed without using bedrails?: Total Help needed moving from lying on your back to sitting on the side of a flat bed without using bedrails?: Total Help needed moving to and from a bed to a chair (including a wheelchair)?: Total Help needed standing up from a chair using your arms (e.g., wheelchair or bedside chair)?: Total Help needed to walk in hospital room?: Total Help needed climbing 3-5 steps with a railing? : Total 6 Click Score: 6    End of Session   Activity Tolerance: Patient tolerated treatment well Patient left: in bed;with call bell/phone within reach Nurse Communication: Mobility status;Need for lift equipment PT Visit Diagnosis: Unsteadiness on feet (R26.81);Difficulty in walking, not elsewhere classified (R26.2);Pain Pain - Right/Left: Left Pain - part of body: Ankle and joints of foot     Time: 1130-1220 PT Time Calculation (min) (ACUTE ONLY): 50 min  Charges:  $Therapeutic Activity: 38-52 mins                     [place] Office [phone] Weekend [phone]    Claretha Cooper 08/22/2022, 2:01 PM

## 2022-08-22 NOTE — Progress Notes (Signed)
[facility]  PROGRESS NOTE  Rishikesh Khachatryan OZD:[id number] DOB: 1961-01-29 DOA: 08/16/2022 PCP: Pcp, No   Brief HPI:     62 y.o. M with DM, HTN, MO, as well as recent osteomyelitis of the left great toe status post amputation, who was transferred from outside hospital for prostatic abscess.2 weeks prior to admission patient diagnosed with prostatitis, treated with Cipro for MSSA. 1 week ago, patient presented to outside hospital with atrial fibrillation, sepsis symptomology, found to have MSSA bacteremia, iliopsoas and prostatic abscesses.   Patient was transferred to [facility], urology and IR was consulted.  Patient was taken to the OR for prostatic abscess treatment.  ID was consulted.  Subjective   Patient seen and examined, states that he is having limitation of movement due to pain in lower back.  Also feels weak because he has been in bed for past 2 weeks.   Assessment/Plan:   MSSA bacteremia -Patient had positive blood culture at [facility] ER for MSSA in 2 out of 2 bottles -Blood culture on 1/13 drawn here was negative to date -Source likely due to ulcer with dissemination to prostate/psoas/vertebral -TTE was negative for vegetation -Given psoas involvement, so presumed to have lumbar osteomyelitis with spillover into psoas -ID anticipates 6 weeks of treatment with IV antibiotics; plan for TEE on 08/27/2022  Bilateral psoas fluid collection -CT abdomen/pelvis obtained today shows bilateral iliopsoas fluid collection, right-sided collection slightly larger as compared to left -IR consulted for CT-guided aspiration and possible drainage of fluid collection -Plan for aspiration and drain placement today  Prostatic abscess S/p TURP, prostate abscess unroofing 1/14 by Dr. [name] - will discontinue foley as per urology recommendation   Osteomyelitis of left foot  S/p LEFT great toe partial amputation on 1/11 by Dr. [name] up at [facility] Discussed with Dr. [name],  surgeon 1/15 by phone Changed post-op dressing on 1/15, toe looks good, no drainage, dehiscence   Dr. [name] recommended: - Change dressing daily -- xeroform, wrapped in gauze, covered in ACE wrap - WBAT in post op shoe - After two weeks (so around Jan 25) stop Xeroform in dressing and use adaptic - Leave sutures in place for 1 month (remove Feb 11) - Follow up with Dr. [name] in [place] within 1 month, preferably within a week of discharge to home or SNF  Hyperthyroidism Abnormal thyroid testing at OSH and briefly on PTU there.   Repeat TSH here normal -T3- 65, free T4- 1.67 -Follow-up endocrinology as outpatient   Hyponatremia Mild   Hypokalemia - replete   Type 2 diabetes mellitus (HCC) Glucoses controlled, A1c >12% - Continue Ss corrections   Severe obstructive sleep apnea - CPAP at night   Paroxysmal atrial fibrillation (HCC) New diagnosis at last admission.  CHA2DS2-Vasc 1.   - Defer anticoagulation for now given low CHADS - Monitor on tele     Normocytic anemia Hgb 10 in setting of recent orthopedic surgery   Morbid obesity (HCC) BMI 47, unclear if he has malnutrition, consult dietitian   Hypercholesteremia Not on statin       Medications     Chlorhexidine Gluconate Cloth  6 each Topical Daily   fentaNYL       flumazenil       insulin aspart  0-15 Units Subcutaneous TID WC   midazolam       multivitamin with minerals  1 tablet Oral Daily   naloxone       Ensure Max Protein  11 oz Oral BID  senna-docusate  2 tablet Oral BID   tamsulosin  0.4 mg Oral Daily     Data Reviewed:   CBG:  Recent Labs  Lab 08/21/22 1123 08/21/22 1615 08/21/22 2155 08/22/22 0838 08/22/22 1139  GLUCAP 233* 257* 208* 242* 189*    SpO2: 97 % O2 Flow Rate (L/min): 3 L/min    Vitals:   08/21/22 1341 08/21/22 1932 08/22/22 0512 08/22/22 1338  BP: 119/81 (!) 143/75 (!) 141/83 (!) 171/91  Pulse: 96 90 90 89  Resp: 20 18 18 16   Temp: 97.7 F (36.5 C) 97.8  F (36.6 C) 98.7 F (37.1 C) 98.7 F (37.1 C)  TempSrc: Oral Oral Oral Oral  SpO2: 97% 98% 94% 97%      Data Reviewed:  Basic Metabolic Panel: Recent Labs  Lab 08/17/22 0115 08/18/22 0511 08/19/22 1025 08/20/22 0543 08/21/22 1043  NA 136 136 136 133* 132*  K 3.1* 3.3* 3.6 3.1* 3.5  CL 104 104 102 99 94*  CO2 26 25 27 27 30   GLUCOSE 99 106* 195* 227* 248*  BUN 15 12 12 11 11   CREATININE 0.95 0.79 0.89 0.78 0.72  CALCIUM 7.4* 7.5* 7.6* 7.4* 7.6*  MG 2.1  --   --   --   --     CBC: Recent Labs  Lab 08/17/22 0115 08/18/22 0511 08/19/22 1025 08/20/22 0543 08/21/22 1043  WBC 18.5* 17.6* 17.8* 14.6* 14.3*  NEUTROABS 14.0*  --   --   --   --   HGB 10.8* 10.5* 10.5* 10.5* 10.6*  HCT 34.1* 32.9* 34.1* 33.3* 33.1*  MCV 85.0 83.7 85.9 83.0 82.3  PLT 196 219 231 252 312    LFT Recent Labs  Lab 08/17/22 0115 08/19/22 1025 08/20/22 0543  AST 41 28 29  ALT 30 30 26   ALKPHOS 121 352* 322*  BILITOT 0.5 0.5 0.7  PROT 5.2* 5.6* 5.3*  ALBUMIN 1.6* 1.7* 1.7*     Antibiotics: Anti-infectives (From admission, onward)    Start     Dose/Rate Route Frequency Ordered Stop   08/18/22 2200  ceFAZolin (ANCEF) IVPB 2g/100 mL premix        2 g 200 mL/hr over 30 Minutes Intravenous Every 8 hours 08/18/22 1712     08/18/22 0200  cefTRIAXone (ROCEPHIN) 2 g in sodium chloride 0.9 % 100 mL IVPB  Status:  Discontinued        2 g 200 mL/hr over 30 Minutes Intravenous Every 24 hours 08/17/22 1304 08/18/22 1712   08/17/22 1400  vancomycin (VANCOREADY) IVPB 1750 mg/350 mL  Status:  Discontinued        1,750 mg 175 mL/hr over 120 Minutes Intravenous Every 12 hours 08/17/22 0049 08/18/22 1712   08/17/22 0130  cefTRIAXone (ROCEPHIN) 1 g in sodium chloride 0.9 % 100 mL IVPB  Status:  Discontinued        1 g 200 mL/hr over 30 Minutes Intravenous Every 24 hours 08/17/22 0039 08/17/22 1304   08/17/22 0130  vancomycin (VANCOREADY) IVPB 2000 mg/400 mL        2,000 mg 200 mL/hr over 120  Minutes Intravenous  Once 08/17/22 0042 08/17/22 0418        DVT prophylaxis: SCDs  Code Status: Full code  Family Communication: No family at bedside   CONSULTS    Objective    Physical Examination:  Appears in no acute distress S1-S2, regular Clear to auscultation bilaterally Abdomen soft, nontender, no organomegaly   Status is: Inpatient:  Oswald Hillock   [facility] If 7PM-7AM, please contact night-coverage at [email or web address], Office  [phone]   08/22/2022, 3:36 PM  LOS: 6 days

## 2022-08-22 NOTE — Plan of Care (Signed)
  Problem: Clinical Measurements: Goal: Diagnostic test results will improve Outcome: Not Progressing   Problem: Nutrition: Goal: Adequate nutrition will be maintained Outcome: Not Progressing   Problem: Pain Managment: Goal: General experience of comfort will improve Outcome: Not Progressing   Problem: Safety: Goal: Ability to remain free from injury will improve Outcome: Not Progressing

## 2022-08-22 NOTE — Procedures (Signed)
Interventional Radiology Procedure:   Indications: Bilateral psoas abscesses  Procedure: CT guided drain placement in left psoas abscess and attempted aspiration of right psoas abscess  Findings: Placed 10 Fr drain in left psoas abscess and removed 20 ml of bloody purulent fluid.  Left psoas abscess is complex with a second compartment or collection.  Will need to follow this with CT.  Aspirated the lower aspect of right psoas but did not get any purulent fluid.  Did not a have a percutaneous window for superior aspect of right psoas where the fluid collection is located.  Will need to follow this with CT.  Complications: No immediate complications noted.     EBL: Minimal  Plan: Fluid sent for culture.  Follow left psoas drain output and will need to follow up CT imaging in future.     Shakira Los R. Anselm Pancoast, MD  Pager: [phone]

## 2022-08-22 NOTE — Progress Notes (Signed)
[facility]  Date of Admission:  08/16/2022     Principal Problem:   MSSA bacteremia Active Problems:   Hypercholesteremia   Morbid obesity ([place])   Prostatic abscess   Normocytic anemia   Paroxysmal atrial fibrillation (HCC)   Osteomyelitis of left foot (HCC)   Severe obstructive sleep apnea   Type 2 diabetes mellitus (HCC)   Possible psoas muscle infection   Hypokalemia   Hyperthyroidism   Hyponatremia   Malnutrition of moderate degree          Assessment: 3 YM with afib, hypothyroidism, DM2, admitted 1/08 to [facility] with severe sepsis found to have disseminated mssa infection and left great toe OM, complicated by dka, transferred to [facility] 1/12 for further management of prostatic abscess.    # Disseminated MSSA bacteremia with  psoas abscess, prostatic abscess and lungs - Blood cultures at [facility] ER on 1/8 grew 2/2 MSSA, blood cultures on 1/13 at our facility with no growth.   -CT at [facility] ER showed hypodensities in prostate gland measuring 3.5X 2.2 cm, in the right Hemi gland and 4X 3 cm in the left Hemi gland suspicious for prostatic abscess.  Right psoas abscess measuring 3.2X 2.7 cm likely reflecting developing abscess, small pelvic free fluid.  Right middle lobe groundglass opacity, partially imaged. - TTE at [facility] on 1/12 showed mildly thickened aortic leaflets. -Urology following, SP TURP/ptostate abscess unroofing on 1/14, foley placed -IR consulted, areas of concern not amenable to drainage, recc abx and f/u CT -IJ out on 1/15 Recommendations: -Continue cefazolin  - Place PICC - Anticipate at least 6 weeks antibiotics, will get TEE on 1/23.  Source of bacteremia was likely the left great toe leading to possible endocarditis metastatic/embolic infection. -Will repeat blood Cx as Cx on 1/13 aerobic only    #Left great toe OM SP amputation on 1/11 with bone  Cx+ MSSA, Staph epi and Corynebacterium amycolatum -Will regard  staph epi and corynebacterium as lily contaminant. Will target coverage for MSSA as above  Microbiology:   Antibiotics: Cefazolin 1/14- Ceftriaxone 1/12- Vanc 1/12- Cultures: Blood 1/13 ng Urine 1/14 MSSA   SUBJECTIVE: Resting in bed, flank pain better this afternoon Interval: Afebrile overnight. Wbc 14.6k  Review of Systems: Review of Systems  All other systems reviewed and are negative.    Scheduled Meds:  Chlorhexidine Gluconate Cloth  6 each Topical Daily   insulin aspart  0-15 Units Subcutaneous TID WC   multivitamin with minerals  1 tablet Oral Daily   Ensure Max Protein  11 oz Oral BID   senna-docusate  2 tablet Oral BID   tamsulosin  0.4 mg Oral Daily   Continuous Infusions:   ceFAZolin (ANCEF) IV 2 g (08/22/22 0551)   sodium chloride irrigation     PRN Meds:.acetaminophen **OR** acetaminophen, bisacodyl, fentaNYL (SUBLIMAZE) injection, naLOXone (NARCAN)  injection, oxyCODONE Allergies  Allergen Reactions   Diltiazem Hcl Other (See Comments)    Causes accelerated heart rate    OBJECTIVE: Vitals:   08/21/22 0808 08/21/22 1341 08/21/22 1932 08/22/22 0512  BP: (!) 154/87 119/81 (!) 143/75 (!) 141/83  Pulse: 92 96 90 90  Resp: 18 20 18 18   Temp: 98 F (36.7 C) 97.7 F (36.5 C) 97.8 F (36.6 C) 98.7 F (37.1 C)  TempSrc: Oral Oral Oral Oral  SpO2: 97% 97% 98% 94%   There is no height or weight on file to calculate BMI.  Physical  Exam Constitutional:      General: He is not in acute distress.    Appearance: He is normal weight. He is not toxic-appearing.  HENT:     Head: Normocephalic and atraumatic.     Right Ear: External ear normal.     Left Ear: External ear normal.     Nose: No congestion or rhinorrhea.     Mouth/Throat:     Mouth: Mucous membranes are moist.     Pharynx: Oropharynx is clear.  Eyes:     Extraocular Movements: Extraocular movements intact.     Conjunctiva/sclera: Conjunctivae normal.     Pupils: Pupils are equal, round,  and reactive to light.  Cardiovascular:     Rate and Rhythm: Normal rate and regular rhythm.     Heart sounds: No murmur heard.    No friction rub. No gallop.  Pulmonary:     Effort: Pulmonary effort is normal.     Breath sounds: Normal breath sounds.  Abdominal:     General: Abdomen is flat. Bowel sounds are normal.     Palpations: Abdomen is soft.  Musculoskeletal:        General: No swelling. Normal range of motion.     Cervical back: Normal range of motion and neck supple.  Skin:    General: Skin is warm and dry.  Neurological:     General: No focal deficit present.     Mental Status: He is oriented to person, place, and time.  Psychiatric:        Mood and Affect: Mood normal.   Toe bandaged    Lab Results Lab Results  Component Value Date   WBC 14.3 (H) 08/21/2022   HGB 10.6 (L) 08/21/2022   HCT 33.1 (L) 08/21/2022   MCV 82.3 08/21/2022   PLT 312 08/21/2022    Lab Results  Component Value Date   CREATININE 0.72 08/21/2022   BUN 11 08/21/2022   NA 132 (L) 08/21/2022   K 3.5 08/21/2022   CL 94 (L) 08/21/2022   CO2 30 08/21/2022    Lab Results  Component Value Date   ALT 26 08/20/2022   AST 29 08/20/2022   ALKPHOS 322 (H) 08/20/2022   BILITOT 0.7 08/20/2022        Laurice Record, MD [facility] 08/22/2022, 12:26 PM

## 2022-08-23 DIAGNOSIS — B9561 Methicillin susceptible Staphylococcus aureus infection as the cause of diseases classified elsewhere: Secondary | ICD-10-CM | POA: Diagnosis not present

## 2022-08-23 DIAGNOSIS — K6812 Psoas muscle abscess: Secondary | ICD-10-CM | POA: Diagnosis not present

## 2022-08-23 DIAGNOSIS — N412 Abscess of prostate: Secondary | ICD-10-CM | POA: Diagnosis not present

## 2022-08-23 DIAGNOSIS — M86072 Acute hematogenous osteomyelitis, left ankle and foot: Secondary | ICD-10-CM | POA: Diagnosis not present

## 2022-08-23 DIAGNOSIS — R7881 Bacteremia: Secondary | ICD-10-CM | POA: Diagnosis not present

## 2022-08-23 LAB — COMPREHENSIVE METABOLIC PANEL
ALT: 24 U/L (ref 0–44)
AST: 31 U/L (ref 15–41)
Albumin: 2.1 g/dL — ABNORMAL LOW (ref 3.5–5.0)
Alkaline Phosphatase: 281 U/L — ABNORMAL HIGH (ref 38–126)
Anion gap: 8 (ref 5–15)
BUN: 17 mg/dL (ref 6–20)
CO2: 28 mmol/L (ref 22–32)
Calcium: 7.6 mg/dL — ABNORMAL LOW (ref 8.9–10.3)
Chloride: 95 mmol/L — ABNORMAL LOW (ref 98–111)
Creatinine, Ser: 0.78 mg/dL (ref 0.61–1.24)
GFR, Estimated: >60 mL/min (ref >60)
Glucose, Bld: 340 mg/dL — ABNORMAL HIGH (ref 70–99)
Potassium: 3.5 mmol/L (ref 3.5–5.1)
Sodium: 131 mmol/L — ABNORMAL LOW (ref 135–145)
Total Bilirubin: 0.5 mg/dL (ref 0.3–1.2)
Total Protein: 6.3 g/dL — ABNORMAL LOW (ref 6.5–8.1)

## 2022-08-23 LAB — CBC
HCT: 34.3 % — ABNORMAL LOW (ref 39.0–52.0)
Hemoglobin: 10.8 g/dL — ABNORMAL LOW (ref 13.0–17.0)
MCH: 25.9 pg — ABNORMAL LOW (ref 26.0–34.0)
MCHC: 31.5 g/dL (ref 30.0–36.0)
MCV: 82.3 fL (ref 80.0–100.0)
Platelets: 330 10*3/uL (ref 150–400)
RBC: 4.17 MIL/uL — ABNORMAL LOW (ref 4.22–5.81)
RDW: 15 % (ref 11.5–15.5)
WBC: 12.2 10*3/uL — ABNORMAL HIGH (ref 4.0–10.5)
nRBC: 0 % (ref 0.0–0.2)

## 2022-08-23 LAB — GLUCOSE, CAPILLARY
Glucose-Capillary: 302 mg/dL — ABNORMAL HIGH (ref 70–99)
Glucose-Capillary: 152 mg/dL — ABNORMAL HIGH (ref 70–99)
Glucose-Capillary: 171 mg/dL — ABNORMAL HIGH (ref 70–99)
Glucose-Capillary: 277 mg/dL — ABNORMAL HIGH (ref 70–99)

## 2022-08-23 MED ORDER — SODIUM CHLORIDE 0.9% FLUSH
10.0000 mL | Freq: Two times a day (BID) | INTRAVENOUS | Status: DC
  Administered 2022-08-23 – 2022-09-05 (×18): 10 mL

## 2022-08-23 MED ORDER — INSULIN GLARGINE-YFGN 100 UNIT/ML ~~LOC~~ SOLN
10.0000 [IU] | Freq: Every day | SUBCUTANEOUS | Status: DC
  Administered 2022-08-23: 10 [IU] via SUBCUTANEOUS
  Filled 2022-08-23 (×2): qty 0.1

## 2022-08-23 MED ORDER — SODIUM CHLORIDE 0.9% FLUSH: 10.0000 mL | INTRAVENOUS | Status: DC | PRN

## 2022-08-23 MED ORDER — INSULIN STARTER KIT- PEN NEEDLES (ENGLISH)
1.0000 | Freq: Once | Status: AC
  Administered 2022-08-23: 1
  Filled 2022-08-23: qty 1

## 2022-08-23 MED ORDER — LIVING WELL WITH DIABETES BOOK
Freq: Once | Status: AC
  Filled 2022-08-23: qty 1

## 2022-08-23 NOTE — Progress Notes (Signed)
Occupational Therapy Treatment Patient Details Name: William [name] MRN: [id number] DOB: [birth year] Today's Date: 08/23/2022   History of present illness Patient is 62 yo male admitted 08/16/22  for evaluation of persistent left-sided low back pain and pain along the suprapubic region , treated for dysuria recently, S/P PROSTATE ABSCESS UNROOFING, TRANSURETHRAL RESECTION OF THE PROSTATE. PMH: right great toe amputation,DM,HTN.previous history:On 08/12/2022 the patient was admitted to the outside hospital with DKA a and A-fib with RVR.  He was complaining of weakness shortness of breath.  He was noted to be hypoxic and his blood sugar was over 500.  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  Further evaluation with CT scan subsequently demonstrated possible prostatic abscess and psoas abscess on the left.   OT comments  Treatment focused on placing patient in chair position to work on seated ADL tasks. Patient needed +2 physical assistance initially to position him anteriorly to get back unsupported and work on core strengthening. Patient washed his face, performed partial upper body bathing and oral care in either seated position with back supported or leaning forward with back unsupported. Patient having a lot of low back pain with movement transitions. OT/PT cotreat to assist and encourage patient to assist with leaning forward, rolling, moving Les in bed and tilting. Patient provided with encouragement and verbal praise and inevitably agreeable to do whatever therapist asked. Continue to recommend AIR at discharge for aggressive rehab. 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    Recommendations for follow up therapy are one component of a multi-disciplinary discharge planning process, led by the attending physician.  Recommendations may be updated based on patient status, additional functional criteria and  insurance authorization.    Follow Up Recommendations  Acute inpatient rehab (3hours/day)     Assistance Recommended at Discharge Frequent or constant Supervision/Assistance  Patient can return home with the following  Two people to help with walking and/or transfers;Two people to help with bathing/dressing/bathroom;Assistance with cooking/housework;Help with stairs or ramp for entrance;Assist for transportation   Equipment Recommendations  None recommended by OT    Recommendations for Other Services      Precautions / Restrictions Precautions Precautions: Fall Precaution Comments: , bariatric, profound weakness of legs, ? due to abcesses in liiopsoas L > R, recent gr. toe amp on  Left Required Braces or Orthoses: Splint/Cast Splint/Cast: post op shoe for left, has his boot for right in closet Restrictions LLE Weight Bearing: Weight bearing as tolerated Other Position/Activity Restrictions: in [place] shoe (has in room)          Balance   Sitting-balance support: Bilateral upper extremity supported, Feet supported Sitting balance-[name] Scale: Poor                                     ADL either performed or assessed with clinical judgement   ADL Overall ADL's : Needs assistance/impaired     Grooming: Set up;Bed level;Sitting Grooming Details (indicate cue type and reason): Patient placed in seated position in bed. Able to wash his face and perform oral care in seated position. oral care was positioned more forward with back away from the bed Upper Body Bathing: Sitting;Moderate assistance Upper Body Bathing Details (indicate cue type and reason): mod assist in seated position. working on maintaining unsupported position while perform bathing task  Functional mobility during ADLs: +2 for physical assistance        Cognition Arousal/Alertness: Awake/alert Behavior During Therapy: WFL for tasks assessed/performed Overall  Cognitive Status: Within Functional Limits for tasks assessed                                 General Comments: Pleasant. Can be talked into things.                   Pertinent Vitals/ Pain       Pain Assessment Pain Assessment: Faces Faces Pain Scale: Hurts whole lot Pain Location: L back - with transitions Pain Descriptors / Indicators: Grimacing, Guarding, Discomfort Pain Intervention(s): Limited activity within patient's tolerance, Monitored during session   Frequency  Min 2X/week        Progress Toward Goals  OT Goals(current goals can now be found in the care plan section)  Progress towards OT goals: Progressing toward goals  Acute Rehab OT Goals Patient Stated Goal: to be able to get up and walk again OT Goal Formulation: With patient Time For Goal Achievement: 09/03/22 Potential to Achieve Goals: Good  Plan Discharge plan remains appropriate    Co-evaluation    PT/OT/SLP Co-Evaluation/Treatment: Yes   PT goals addressed during session: Mobility/safety with mobility;Strengthening/ROM OT goals addressed during session: ADL's and self-care;Strengthening/ROM      AM-PAC OT "6 Clicks" Daily Activity     Outcome Measure   Help from another person eating meals?: None Help from another person taking care of personal grooming?: A Little Help from another person toileting, which includes using toliet, bedpan, or urinal?: A Lot Help from another person bathing (including washing, rinsing, drying)?: A Lot Help from another person to put on and taking off regular upper body clothing?: A Little Help from another person to put on and taking off regular lower body clothing?: Total 6 Click Score: 15    End of Session    OT Visit Diagnosis: Other abnormalities of gait and mobility (R26.89);Muscle weakness (generalized) (M62.81);Pain   Activity Tolerance Patient limited by pain   Patient Left in bed;with call bell/phone within reach   Nurse  Communication Mobility status        Time: 1137-1225 OT Time Calculation (min): 48 min  Charges: OT General Charges $OT Visit: 1 Visit OT Treatments $Self Care/Home Management : 8-22 mins  Gustavo Lah, OTR/L [place]  Office [phone]   Lenward Chancellor 08/23/2022, 1:22 PM

## 2022-08-23 NOTE — Consult Note (Signed)
Urology Inpatient Progress Report  Prostatic abscess [N41.2]  Procedure(s): PROSTATE ABSCESS UNROOFING, TRANSURETHRAL RESECTION OF THE PROSTATE (TURP)  5 Days Post-Op   Intv/Subj: No acute events overnight. Feeling a lot better Catheter out and voiding on his own without dysuria IR placed drain in one psoas collection, aspirated the other   Principal Problem:   MSSA bacteremia Active Problems:   Hypercholesteremia   Morbid obesity ([place])   Prostatic abscess   Normocytic anemia   Paroxysmal atrial fibrillation (HCC)   Osteomyelitis of left foot (HCC)   Severe obstructive sleep apnea   Type 2 diabetes mellitus ([place])   Possible psoas muscle infection   Hypokalemia   Hyperthyroidism   Hyponatremia   Malnutrition of moderate degree  Current Facility-Administered Medications  Medication Dose Route Frequency Provider Last Rate Last Admin   acetaminophen (TYLENOL) tablet 650 mg  650 mg Oral Q6H PRN Ardis Hughs, MD       Or   acetaminophen (TYLENOL) suppository 650 mg  650 mg Rectal Q6H PRN Ardis Hughs, MD       bisacodyl (DULCOLAX) suppository 10 mg  10 mg Rectal Daily PRN Ardis Hughs, MD       ceFAZolin (ANCEF) IVPB 2g/100 mL premix  2 g Intravenous Q8H Vu, Trung T, MD 200 mL/hr at 08/23/22 0609 2 g at 08/23/22 2956   Chlorhexidine Gluconate Cloth 2 % PADS 6 each  6 each Topical Daily Ardis Hughs, MD   6 each at 08/23/22 0808   fentaNYL (SUBLIMAZE) injection 50 mcg  50 mcg Intravenous Q2H PRN Ardis Hughs, MD   50 mcg at 08/18/22 0520   insulin aspart (novoLOG) injection 0-15 Units  0-15 Units Subcutaneous TID WC Ardis Hughs, MD   11 Units at 08/23/22 0805   insulin glargine-yfgn (SEMGLEE) injection 10 Units  10 Units Subcutaneous QHS Oswald Hillock, MD       multivitamin with minerals tablet 1 tablet  1 tablet Oral Daily Oswald Hillock, MD   1 tablet at 08/23/22 0805   naloxone [facility]) injection 0.4 mg  0.4 mg Intravenous PRN  Ardis Hughs, MD       oxyCODONE (Oxy IR/ROXICODONE) immediate release tablet 5 mg  5 mg Oral Q4H PRN Ardis Hughs, MD   5 mg at 08/23/22 0805   protein supplement (ENSURE MAX) liquid  11 oz Oral BID Oswald Hillock, MD   11 oz at 08/23/22 2130   senna-docusate (Senokot-S) tablet 2 tablet  2 tablet Oral BID Ardis Hughs, MD   2 tablet at 08/23/22 0805   sodium chloride flush (NS) 0.9 % injection 5 mL  5 mL Intracatheter Q8H Markus Daft, MD   5 mL at 08/23/22 0611   sodium chloride irrigation 0.9 % 3,000 mL  3,000 mL Irrigation Continuous Ardis Hughs, MD   3,000 mL at 08/19/22 0553   tamsulosin (FLOMAX) capsule 0.4 mg  0.4 mg Oral Daily Edwin Dada, MD   0.4 mg at 08/23/22 0805     Objective: Vital: Vitals:   08/22/22 1730 08/22/22 2116 08/23/22 0500 08/23/22 0610  BP: (!) 161/95 114/80  (!) 153/64  Pulse: 89 83  82  Resp: (!) 23 18  18   Temp:  99.3 F (37.4 C)  98.4 F (36.9 C)  TempSrc:  Oral  Oral  SpO2: 99% 94%  99%  Weight:   (!) 178 kg    I/Os: I/O last 3 completed shifts: In:  44 [P.O.:960; I.V.:5; Other:5; IV Piggyback:100] Out: 2423 [Urine:3600; Drains:55]  Physical Exam:  General: Patient is in no apparent distress, morbidly obese CV: RRR Lungs: Normal respiratory effort, chest expands symmetrically. Foley: out - urine straw colored Ext: left foot wrapped.  Lab Results: Recent Labs    08/21/22 1043 08/23/22 0556  WBC 14.3* 12.2*  HGB 10.6* 10.8*  HCT 33.1* 34.3*   Recent Labs    08/21/22 1043 08/23/22 0556  NA 132* 131*  K 3.5 3.5  CL 94* 95*  CO2 30 28  GLUCOSE 248* 340*  BUN 11 17  CREATININE 0.72 0.78  CALCIUM 7.6* 7.6*   No results for input(s): "LABPT", "INR" in the last 72 hours.  No results for input(s): "LABURIN" in the last 72 hours. Results for orders placed or performed during the hospital encounter of 08/16/22  Culture, blood (Routine X 2) w Reflex to ID Panel     Status: None   Collection Time:  08/17/22  1:15 AM   Specimen: BLOOD  Result Value Ref Range Status   Specimen Description   Final    BLOOD BLOOD LEFT HAND Performed at [facility], [place] [address] [place], [place] [address]    Special Requests   Final    BOTTLES DRAWN AEROBIC ONLY SITE NOT SPECIFIED Performed at [facility] [address], [place], [place] [address]    Culture   Final    NO GROWTH 5 DAYS Performed at [facility], [place] [address] [place], [place] [address]    Report Status 08/22/2022 FINAL  Final  Culture, blood (Routine X 2) w Reflex to ID Panel     Status: None   Collection Time: 08/17/22  1:15 AM   Specimen: BLOOD  Result Value Ref Range Status   Specimen Description   Final    BLOOD BLOOD RIGHT HAND Performed at [facility] [address], [place], [place] [address]    Special Requests   Final    BOTTLES DRAWN AEROBIC ONLY Blood Culture adequate volume Performed at [facility] [address] [place], [place] [address]    Culture   Final    NO GROWTH 5 DAYS Performed at [facility], [place] [address] [place], [place] [address]    Report Status 08/22/2022 FINAL  Final  Urine Culture     Status: Abnormal   Collection Time: 08/18/22 11:17 AM   Specimen: Urine, Cystoscope  Result Value Ref Range Status   Specimen Description   Final    CYSTOSCOPY Performed at [facility] [address], [place], [place] [address]    Special Requests   Final    NONE Performed at [facility], [place] [address] [place], [place] [address]    Culture >=100,000 COLONIES/mL STAPHYLOCOCCUS AUREUS (A)  Final   Report Status 08/20/2022 FINAL  Final   Organism ID, Bacteria STAPHYLOCOCCUS AUREUS (A)  Final      Susceptibility   Staphylococcus aureus - MIC*    CIPROFLOXACIN <=0.5 SENSITIVE Sensitive     ERYTHROMYCIN >=8 RESISTANT Resistant     GENTAMICIN <=0.5 SENSITIVE  Sensitive     OXACILLIN <=0.25 SENSITIVE Sensitive     TETRACYCLINE <=1 SENSITIVE Sensitive     VANCOMYCIN 1 SENSITIVE Sensitive     TRIMETH/SULFA <=10 SENSITIVE Sensitive     CLINDAMYCIN <=0.25 SENSITIVE Sensitive     RIFAMPIN <=0.5 SENSITIVE Sensitive     Inducible Clindamycin  NEGATIVE Sensitive     * >=100,000 COLONIES/mL STAPHYLOCOCCUS AUREUS  Culture, blood (Routine X 2) w Reflex to ID Panel     Status: None (Preliminary result)   Collection Time: 08/22/22  1:03 PM   Specimen: BLOOD LEFT HAND  Result Value Ref Range Status   Specimen Description   Final    BLOOD LEFT HAND Performed at [facility], [address], [place], [place] [address]    Special Requests   Final    BOTTLES DRAWN AEROBIC AND ANAEROBIC Blood Culture adequate volume Performed at [facility], [address] [place], [place] [address]    Culture   Final    NO GROWTH < 24 HOURS Performed at [facility], [address], [place], [place] [address]    Report Status PENDING  Incomplete  Culture, blood (Routine X 2) w Reflex to ID Panel     Status: None (Preliminary result)   Collection Time: 08/22/22  1:09 PM   Specimen: BLOOD  Result Value Ref Range Status   Specimen Description   Final    BLOOD BLOOD RIGHT HAND AEROBIC BOTTLE ONLY ANAEROBIC BOTTLE ONLY Performed at [facility], [address], [place], [place] [address]    Special Requests   Final    BOTTLES DRAWN AEROBIC AND ANAEROBIC Blood Culture adequate volume Performed at [facility], [address], [place], [place] [address]    Culture   Final    NO GROWTH < 24 HOURS Performed at [facility], [address] [place], [place] [address]    Report Status PENDING  Incomplete  Aerobic/Anaerobic Culture w Gram Stain (surgical/deep wound)     Status: None (Preliminary result)   Collection Time: 08/22/22  5:38 PM   Specimen: Abscess  Result Value Ref Range Status    Specimen Description   Final    ABSCESS Performed at [facility], [address] [place], [place] [address]    Special Requests LEFT PSOAS  Final   Gram Stain   Final    ABUNDANT WBC PRESENT, PREDOMINANTLY PMN MODERATE GRAM POSITIVE COCCI IN PAIRS    Culture   Final    TOO YOUNG TO READ Performed at [facility], [address] [place], [place] [address]    Report Status PENDING  Incomplete    Studies/Results:   Assessment: Procedure(s): PROSTATE ABSCESS UNROOFING, TRANSURETHRAL RESECTION OF THE PROSTATE (TURP), 5 Days Post-Op    Feeling stronger and less pain after his infections have been drained.  Voiding on his own. 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  I informed him and his wife.  Plan: MSSA from prostate abscess - abx duration per ID Will need to f/u to discuss prostate cancer in more detail, further w/u and management.  He needs a TRUS prostate biopsy, but this can and will be done as outpatient in the coming months.  Reassured patient.  Plan to continue aggressive rehab for now and f/u with me in 6-8 weeks for more discussion.  Will sign-off, page urology for additional questions/concerns.    Berniece Salines, MD Urology 08/23/2022, 11:45 AM

## 2022-08-23 NOTE — Progress Notes (Addendum)
[facility]  PROGRESS NOTE  William [name] OZD:[id number] DOB: Jan 20, 1961 DOA: 08/16/2022 PCP: Pcp, No   Brief HPI:     62 y.o. M with DM, HTN, MO, as well as recent osteomyelitis of the left great toe status post amputation, who was transferred from outside hospital for prostatic abscess.2 weeks prior to admission patient diagnosed with prostatitis, treated with Cipro for MSSA. 1 week ago, patient presented to outside hospital with atrial fibrillation, sepsis symptomology, found to have MSSA bacteremia, iliopsoas and prostatic abscesses.   Patient was transferred to [facility], urology and IR was consulted.  Patient was taken to the OR for prostatic abscess treatment.  ID was consulted.  Subjective   Patient seen and examined, had aspiration of left psoas abscess with drain placement.  Right psoas abscess could not be drained due to poor window.   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 -Will need repeat CT after few days of antibiotics to check for resolution of this abscess  Prostatic abscess S/p TURP, prostate abscess unroofing 1/14 by Dr. Louis Meckel -Foley  catheter was just continued per urology recommendation on 06/22/2023 - will discontinue foley as per urology recommendation   Osteomyelitis of left foot  S/p LEFT great toe partial amputation on 1/11 by Dr. Rocco Serene up at [facility] Discussed with Dr. Rocco Serene, surgeon 1/15 by phone Changed post-op dressing on 1/15, toe looks good, no drainage, dehiscence   Dr. Rocco Serene recommended: - Change dressing daily -- xeroform, wrapped in gauze, covered in ACE wrap - WBAT in post op shoe - After two weeks (so around Jan 25) stop Xeroform in dressing and use adaptic - Leave sutures in place for 1 month (remove Feb 11) - Follow up with Dr. Rocco Serene in [place] within 1 month, preferably within a week of discharge to home or SNF  Hyperthyroidism Abnormal thyroid testing at OSH and briefly on PTU there.   Repeat TSH here normal -T3- 65, free T4- 1.67 -Follow-up endocrinology as outpatient   Hyponatremia Mild   Hypokalemia - replete   Type 2 diabetes mellitus (HCC) Glucoses controlled, A1c >12% - Continue Ss corrections -CBG has been elevated -Will start Semglee 10 units subcu nightly   Severe obstructive sleep apnea - CPAP at night   Paroxysmal atrial fibrillation ([place]) New diagnosis at last admission.  CHA2DS2-Vasc 1.   - Defer anticoagulation for now given low CHADS - Monitor on tele     Normocytic anemia Hgb 10 in setting of recent orthopedic surgery   Morbid obesity ([place]) BMI 47, unclear if he has malnutrition, consult dietitian   Hypercholesteremia Not on statin       Medications     Chlorhexidine Gluconate Cloth  6 each Topical Daily   insulin aspart  0-15 Units Subcutaneous TID WC  multivitamin with minerals  1 tablet Oral Daily   Ensure Max Protein  11 oz Oral BID   senna-docusate  2 tablet Oral BID   sodium chloride flush  5 mL Intracatheter Q8H   tamsulosin  0.4 mg Oral Daily     Data Reviewed:   CBG:  Recent Labs  Lab 08/22/22 0838 08/22/22 1139  08/22/22 1804 08/22/22 2121 08/23/22 0725  GLUCAP 242* 189* 150* 229* 302*    SpO2: 99 % O2 Flow Rate (L/min): 2 L/min    Vitals:   08/22/22 1730 08/22/22 2116 08/23/22 0500 08/23/22 0610  BP: (!) 161/95 114/80  (!) 153/64  Pulse: 89 83  82  Resp: (!) 23 18  18   Temp:  99.3 F (37.4 C)  98.4 F (36.9 C)  TempSrc:  Oral  Oral  SpO2: 99% 94%  99%  Weight:   (!) 178 kg       Data Reviewed:  Basic Metabolic Panel: Recent Labs  Lab 08/17/22 0115 08/18/22 0511 08/19/22 1025 08/20/22 0543 08/21/22 1043 08/23/22 0556  NA 136 136 136 133* 132* 131*  K 3.1* 3.3* 3.6 3.1* 3.5 3.5  CL 104 104 102 99 94* 95*  CO2 26 25 27 27 30 28   GLUCOSE 99 106* 195* 227* 248* 340*  BUN 15 12 12 11 11 17   CREATININE 0.95 0.79 0.89 0.78 0.72 0.78  CALCIUM 7.4* 7.5* 7.6* 7.4* 7.6* 7.6*  MG 2.1  --   --   --   --   --     CBC: Recent Labs  Lab 08/17/22 0115 08/18/22 0511 08/19/22 1025 08/20/22 0543 08/21/22 1043 08/23/22 0556  WBC 18.5* 17.6* 17.8* 14.6* 14.3* 12.2*  NEUTROABS 14.0*  --   --   --   --   --   HGB 10.8* 10.5* 10.5* 10.5* 10.6* 10.8*  HCT 34.1* 32.9* 34.1* 33.3* 33.1* 34.3*  MCV 85.0 83.7 85.9 83.0 82.3 82.3  PLT 196 219 231 252 312 330    LFT Recent Labs  Lab 08/17/22 0115 08/19/22 1025 08/20/22 0543 08/23/22 0556  AST 41 28 29 31   ALT 30 30 26 24   ALKPHOS 121 352* 322* 281*  BILITOT 0.5 0.5 0.7 0.5  PROT 5.2* 5.6* 5.3* 6.3*  ALBUMIN 1.6* 1.7* 1.7* 2.1*     Antibiotics: Anti-infectives (From admission, onward)    Start     Dose/Rate Route Frequency Ordered Stop   08/18/22 2200  ceFAZolin (ANCEF) IVPB 2g/100 mL premix        2 g 200 mL/hr over 30 Minutes Intravenous Every 8 hours 08/18/22 1712     08/18/22 0200  cefTRIAXone (ROCEPHIN) 2 g in sodium chloride 0.9 % 100 mL IVPB  Status:  Discontinued        2 g 200 mL/hr over 30 Minutes Intravenous Every 24 hours 08/17/22 1304 08/18/22 1712   08/17/22 1400  vancomycin (VANCOREADY) IVPB 1750 mg/350  mL  Status:  Discontinued        1,750 mg 175 mL/hr over 120 Minutes Intravenous Every 12 hours 08/17/22 0049 08/18/22 1712   08/17/22 0130  cefTRIAXone (ROCEPHIN) 1 g in sodium chloride 0.9 % 100 mL IVPB  Status:  Discontinued        1 g 200 mL/hr over 30 Minutes Intravenous Every 24 hours 08/17/22 0039 08/17/22 1304   08/17/22 0130  vancomycin (VANCOREADY) IVPB 2000 mg/400 mL        2,000 mg 200 mL/hr over 120 Minutes Intravenous  Once 08/17/22 0042 08/17/22 0418        DVT prophylaxis: SCDs  Code Status: Full code  Family Communication: No family at bedside   CONSULTS    Objective    Physical Examination:  Appears in no acute distress S1-S2, regular Clear to auscultation bilaterally Abdomen is soft, nontender, left flank drain in place   Status is: Inpatient:       Meredeth Ide   [facility] If 7PM-7AM, please contact night-coverage at [email or web address], Office  [phone]   08/23/2022, 11:05 AM  LOS: 7 days

## 2022-08-23 NOTE — Progress Notes (Signed)
Physical Therapy Treatment Patient Details Name: Staton Markey MRN: [id number] DOB: 1961/07/02 Today's Date: 08/23/2022   History of Present Illness Patient is 62 yo male admitted 08/16/22  for evaluation of persistent left-sided low back pain and pain along the suprapubic region , treated for dysuria recently, S/P PROSTATE ABSCESS UNROOFING, TRANSURETHRAL RESECTION OF THE PROSTATE. PMH: right great toe amputation,DM,HTN.previous history:On 08/12/2022 the patient was admitted to the outside hospital with DKA a and A-fib with RVR.  He was complaining of weakness shortness of breath.  He was noted to be hypoxic and his blood sugar was over 500.  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  Further evaluation with CT scan subsequently demonstrated possible prostatic abscess and psoas abscess on the left.    PT Comments    Patient highly motivated to participate in therapy despite significant pain. Pt required multimodal cues to and KREG bed adjusted to chair position to facilitate forward/upright posture. +2 assist required to facilitate forward trunk lean and after multiple efforts pt able to maintain trunk unsupported from bed in sitting. He required Rt forearm prop on bed rail to maintain forward posture. Pt able to complete self care activities with OT and following completion participated in LE WB activity with Tilt bed to progress upright standing. Pt progressed from 23* (14kg)>37* (53kg)>40* (~60 kg). Repeat mini-squats completed in tilted position with manual facilitation needed for end range quad extension on Lt LE. Pt will benefit from intense follow up rehab at AIR setting to address impairments and maximize regain of functional independence.   Recommendations for follow up therapy are one component of a multi-disciplinary discharge planning process, led by the attending physician.  Recommendations may be updated based on  patient status, additional functional criteria and insurance authorization.  Follow Up Recommendations  Acute inpatient rehab (3hours/day) Can patient physically be transported by private vehicle: No   Assistance Recommended at Discharge Frequent or constant Supervision/Assistance  Patient can return home with the following Two people to help with walking and/or transfers;A lot of help with bathing/dressing/bathroom;Assist for transportation;Help with stairs or ramp for entrance;Assistance with cooking/housework;Direct supervision/assist for medications management   Equipment Recommendations  None recommended by PT    Recommendations for Other Services Rehab consult     Precautions / Restrictions Precautions Precautions: Fall Precaution Comments: , bariatric, profound weakness of legs, ? due to abcesses in liiopsoas L > R, recent gr. toe amp on  Left Required Braces or Orthoses: Splint/Cast Splint/Cast: post op shoe for left, has his boot for right in closet Restrictions Weight Bearing Restrictions: No LLE Weight Bearing: Weight bearing as tolerated Other Position/Activity Restrictions: in [place] shoe (has in room)     Mobility  Bed Mobility Overal bed mobility: Needs Assistance Bed Mobility: Rolling Rolling: Mod assist         General bed mobility comments: assist to bed knees for rolling Rt/Lt to adjust/change bed pad at EOS. Start Time: 72 Angle: 40 degrees (23*, 37*, 40*) Total Minutes in Angle: 10 minutes Patient Response: Cooperative  Transfers                        Ambulation/Gait                   Stairs             Wheelchair Mobility    Modified Rankin (Stroke Patients Only)  Balance Overall balance assessment: Needs assistance Sitting-balance support: Bilateral upper extremity supported, Feet supported Sitting balance-[name] Scale: Poor Sitting balance - Comments: NT this visit                                     Cognition Arousal/Alertness: Awake/alert Behavior During Therapy: WFL for tasks assessed/performed Overall Cognitive Status: Within Functional Limits for tasks assessed                                 General Comments: Pleasant. Can be talked into things.        Exercises      General Comments General comments (skin integrity, edema, etc.): significant Lt knee valgus noted wtih increased weight bearing in tilt. may want to brace for future standing. Pt reports he has always been "knock kneed".      Pertinent Vitals/Pain Pain Assessment Pain Assessment: Faces Faces Pain Scale: Hurts whole lot Pain Location: L back - with transitions Pain Descriptors / Indicators: Grimacing, Guarding, Discomfort Pain Intervention(s): Limited activity within patient's tolerance, Monitored during session, Repositioned    Home Living                          Prior Function            PT Goals (current goals can now be found in the care plan section) Acute Rehab PT Goals PT Goal Formulation: With patient Time For Goal Achievement: 09/02/22 Potential to Achieve Goals: Good Progress towards PT goals: Progressing toward goals    Frequency    Min 3X/week      PT Plan Current plan remains appropriate    Co-evaluation PT/OT/SLP Co-Evaluation/Treatment: Yes Reason for Co-Treatment: For patient/therapist safety;To address functional/ADL transfers PT goals addressed during session: Mobility/safety with mobility;Strengthening/ROM;Balance OT goals addressed during session: Strengthening/ROM;ADL's and self-care      AM-PAC PT "6 Clicks" Mobility   Outcome Measure  Help needed turning from your back to your side while in a flat bed without using bedrails?: Total Help needed moving from lying on your back to sitting on the side of a flat bed without using bedrails?: Total Help needed moving to and from a bed to a chair (including a wheelchair)?:  Total Help needed standing up from a chair using your arms (e.g., wheelchair or bedside chair)?: Total Help needed to walk in hospital room?: Total Help needed climbing 3-5 steps with a railing? : Total 6 Click Score: 6    End of Session Equipment Utilized During Treatment: Gait belt Activity Tolerance: Patient tolerated treatment well Patient left: in bed;with call bell/phone within reach Nurse Communication: Mobility status;Need for lift equipment PT Visit Diagnosis: Unsteadiness on feet (R26.81);Difficulty in walking, not elsewhere classified (R26.2);Pain Pain - Right/Left: Left Pain - part of body: Ankle and joints of foot     Time: 0865-7846 PT Time Calculation (min) (ACUTE ONLY): 45 min  Charges:  $Therapeutic Exercise: 8-22 mins $Therapeutic Activity: 8-22 mins                     Verner Mould, DPT Acute Rehabilitation Services Office [phone]  08/23/22 3:31 PM

## 2022-08-23 NOTE — Progress Notes (Signed)
Peripherally Inserted Central Catheter Placement  The IV Nurse has discussed with the patient and/or persons authorized to consent for the patient, the purpose of this procedure and the potential benefits and risks involved with this procedure.  The benefits include less needle sticks, lab draws from the catheter, and the patient may be discharged home with the catheter. Risks include, but not limited to, infection, bleeding, blood clot (thrombus formation), and puncture of an artery; nerve damage and irregular heartbeat and possibility to perform a PICC exchange if needed/ordered by physician.  Alternatives to this procedure were also discussed.  Bard Power PICC patient education guide, fact sheet on infection prevention and patient information card has been provided to patient /or left at bedside.    PICC Placement Documentation  PICC Single Lumen 71/06/26 Left Basilic 50 cm 1 cm (Active)  Site Assessment Clean, Dry, Intact 08/23/22 1514  Line Status Flushed;Blood return noted;Saline locked 08/23/22 1514  Dressing Type Transparent;Securing device 08/23/22 1514  Dressing Status Antimicrobial disc in place 08/23/22 [place] Not Applicable 94/85/46 2703  Dressing Change Due 08/30/22 08/23/22 [place] 08/23/2022, 3:15 PM

## 2022-08-23 NOTE — Progress Notes (Addendum)
[facility]  Date of Admission:  08/16/2022     Principal Problem:   MSSA bacteremia Active Problems:   Hypercholesteremia   Morbid obesity ([place])   Prostatic abscess   Normocytic anemia   Paroxysmal atrial fibrillation (HCC)   Osteomyelitis of left foot (HCC)   Severe obstructive sleep apnea   Type 2 diabetes mellitus (HCC)   Possible psoas muscle infection   Hypokalemia   Hyperthyroidism   Hyponatremia   Malnutrition of moderate degree          Assessment: 38 YM with afib, hypothyroidism, DM2, admitted 1/08 to [facility] with severe sepsis found to have disseminated mssa infection and left great toe OM, complicated by dka, [facility] for further management of prostatic abscess.    # Disseminated MSSA bacteremia with  b/l psoas abscess, prostatic abscess and lungs - Blood cultures at [facility] on 1/8 grew 2/2 MSSA, blood cultures on 1/13 at our facility with no growth.   -CT at [facility] ER showed hypodensities in prostate gland measuring 3.5X 2.2 cm, in the right Hemi gland and 4X 3 cm in the left Hemi gland suspicious for prostatic abscess.  Right psoas abscess measuring 3.2X 2.7 cm likely reflecting developing abscess, small pelvic free fluid.  Right middle lobe groundglass opacity, partially imaged. - TTE at [facility] on 1/12 showed mildly thickened aortic leaflets. -Urology following, SP TURP/ptostate abscess unroofing on 1/14, foley placed -IR re-engaged as repeat CT on 1/17 showed b/l iliopsoas fluid collection, right side larger. On 1/18 pt underwent drain placment in left psoad abscess -removed 76ml bloody purulent fluid(Cx pending). Noted that left psoad abscess has second collection, and right posad collection not aspirated(no window avaialbe. Recc Follow-up CT.  -IJ out on 1/15 Recommendations: -Continue cefazolin  - Place PICC - Anticipate at least 6 weeks antibiotics, will get TEE on 1/23.  Source of bacteremia was  likely the left great toe leading to possible endocarditis metastatic/embolic infection. Will follow clinically to assess timing of re-imaging psoas abscess.  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 Will target coverage for MSSA as above   Dr. West Bali will be covering this weekend.  Microbiology:   Antibiotics: Cefazolin 1/14- Ceftriaxone 1/12- Vanc 1/12- Cultures: Blood 1/13 ng Urine 1/14 MSSA   SUBJECTIVE: Resting in bed, flank pain better this afternoon Interval: Afebrile overnight.   Review of Systems: Review of Systems  All other systems reviewed and are negative.    Scheduled Meds:  Chlorhexidine Gluconate Cloth  6 each Topical Daily   insulin aspart  0-15 Units Subcutaneous TID WC   insulin glargine-yfgn  10 Units Subcutaneous QHS   multivitamin with minerals  1 tablet Oral Daily   Ensure Max Protein  11 oz Oral BID   senna-docusate  2 tablet Oral BID   sodium chloride flush  5 mL Intracatheter Q8H   tamsulosin  0.4 mg Oral Daily   Continuous Infusions:   ceFAZolin (ANCEF) IV 2 g (08/23/22 0609)   sodium chloride irrigation     PRN Meds:.acetaminophen **OR** acetaminophen, bisacodyl, fentaNYL (SUBLIMAZE) injection, naLOXone (NARCAN)  injection, oxyCODONE Allergies  Allergen Reactions   Diltiazem Hcl Other (See Comments)    Causes accelerated heart rate  OBJECTIVE: Vitals:   08/22/22 1730 08/22/22 2116 08/23/22 0500 08/23/22 0610  BP: (!) 161/95 114/80  (!) 153/64  Pulse: 89 83  82  Resp: (!) 23 18  18   Temp:  99.3 F (37.4 C)  98.4 F (36.9 C)  TempSrc:  Oral  Oral  SpO2: 99% 94%  99%  Weight:   (!) 178 kg    Body mass index is 47.77 kg/m.  Physical Exam Constitutional:      General: He is not in acute distress.    Appearance: He is normal  weight. He is not toxic-appearing.  HENT:     Head: Normocephalic and atraumatic.     Right Ear: External ear normal.     Left Ear: External ear normal.     Nose: No congestion or rhinorrhea.     Mouth/Throat:     Mouth: Mucous membranes are moist.     Pharynx: Oropharynx is clear.  Eyes:     Extraocular Movements: Extraocular movements intact.     Conjunctiva/sclera: Conjunctivae normal.     Pupils: Pupils are equal, round, and reactive to light.  Cardiovascular:     Rate and Rhythm: Normal rate and regular rhythm.     Heart sounds: No murmur heard.    No friction rub. No gallop.  Pulmonary:     Effort: Pulmonary effort is normal.     Breath sounds: Normal breath sounds.  Abdominal:     General: Abdomen is flat. Bowel sounds are normal.     Palpations: Abdomen is soft.  Musculoskeletal:        General: No swelling. Normal range of motion.     Cervical back: Normal range of motion and neck supple.  Skin:    General: Skin is warm and dry.  Neurological:     General: No focal deficit present.     Mental Status: He is oriented to person, place, and time.  Psychiatric:        Mood and Affect: Mood normal.   Toe bandaged    Lab Results Lab Results  Component Value Date   WBC 12.2 (H) 08/23/2022   HGB 10.8 (L) 08/23/2022   HCT 34.3 (L) 08/23/2022   MCV 82.3 08/23/2022   PLT 330 08/23/2022    Lab Results  Component Value Date   CREATININE 0.78 08/23/2022   BUN 17 08/23/2022   NA 131 (L) 08/23/2022   K 3.5 08/23/2022   CL 95 (L) 08/23/2022   CO2 28 08/23/2022    Lab Results  Component Value Date   ALT 24 08/23/2022   AST 31 08/23/2022   ALKPHOS 281 (H) 08/23/2022   BILITOT 0.5 08/23/2022        Laurice Record, MD [facility] 08/23/2022, 12:49 PM

## 2022-08-23 NOTE — Progress Notes (Signed)
PT refused CPAP.  

## 2022-08-23 NOTE — Progress Notes (Addendum)
Unsuccessful attempt PICC line insertion x2  in right upper arm,easy to accessed but dilator is  difficult to penetrate the vein.Tried left upper arm using basilic vein with no difficulty.With good blood return and PICC ready to use.

## 2022-08-23 NOTE — Progress Notes (Signed)
Referring Physician(s): Lama,G  Supervising Physician: Corrie Mckusick  Patient Status:  [name] - In-pt  Chief Complaint: Back pain, prostate/psoas abscesses   Subjective: Pt undergoing bedside PICC placement; c/o some back pain; afebrile   Allergies: Diltiazem hcl  Medications: Prior to Admission medications   Medication Sig Start Date End Date Taking? Authorizing Provider  albuterol (ACCUNEB) 1.25 MG/3ML nebulizer solution Take 1 ampule by nebulization every 6 (six) hours as needed for shortness of breath or wheezing. 07/21/22  Yes [provider]  ciprofloxacin (CIPRO) 500 MG tablet Take 1 tablet (500 mg total) by mouth 2 (two) times daily for 14 days. 08/09/22 08/23/22 Yes Stoneking, Reece Leader., MD  meloxicam (MOBIC) 7.5 MG tablet Take 1 tablet (7.5 mg total) by mouth daily. 08/09/22  Yes Stoneking, Reece Leader., MD  oxyCODONE (OXY IR/ROXICODONE) 5 MG immediate release tablet Take 1 tablet (5 mg total) by mouth every 6 (six) hours as needed for severe pain. 08/09/22  Yes Stoneking, Reece Leader., MD  tamsulosin (FLOMAX) 0.4 MG CAPS capsule Take 1 capsule (0.4 mg total) by mouth daily. 08/09/22  Yes Stoneking, Reece Leader., MD     Vital Signs: BP (!) 153/64 (BP Location: Right Arm)   Pulse 82   Temp 98.4 F (36.9 C) (Oral)   Resp 18   Wt (!) 392 lb 6.7 oz (178 kg)   SpO2 99%   BMI 47.77 kg/m   Physical Exam awake/alert; LLQ/psoas drain intact, insertion site ok, mildly tender, OP 55 cc yesterday, 15 cc today serosang fluid  Imaging: CT GUIDED PERITONEAL/RETROPERITONEAL FLUID DRAIN BY PERC CATH  Result Date: 08/23/2022 INDICATION: 62 year old with history of prostate abscess. Bilateral psoas fluid collections are concerning for abscesses. EXAM: 1. CT-guided placement of drainage catheter in left psoas abscess 2. CT-guided aspiration of right psoas muscle MEDICATIONS: Moderate sedation ANESTHESIA/SEDATION: Moderate (conscious) sedation was employed during this procedure. A  total of Versed 3mg  and fentanyl 150 mcg was administered intravenously at the order of the provider performing the procedure. Total intra-service moderate sedation time: 61 minutes. Patient's level of consciousness and vital signs were monitored continuously by radiology nurse throughout the procedure under the supervision of the provider performing the procedure. COMPLICATIONS: None immediate. PROCEDURE: Informed written consent was obtained from the patient after a thorough discussion of the procedural risks, benefits and alternatives. All questions were addressed. Maximal Sterile Barrier Technique was utilized including caps, mask, sterile gowns, sterile gloves, sterile drape, hand hygiene and skin antiseptic. A timeout was performed prior to the initiation of the procedure. Patient was placed supine on the CT table and the left side was slightly elevated. CT images through the lower abdomen and pelvis were identified. The abnormal left psoas muscle was identified. The left lower quadrant of the abdomen was prepped with chlorhexidine and sterile field was created. Skin was anesthetized using 1% lidocaine. Using CT guidance, an 18 gauge trocar needle was directed into the lateral aspect of the left psoas muscle. Bloody purulent fluid was aspirated. Superstiff Amplatz wire was advanced into the muscle. The tract was dilated to accommodate a 10 [place] drain. 20 mL of bloody purulent fluid was removed. Follow up CT images were obtained. Drain was attached to a suction bulb and sutured to skin. Fluid was sent for culture. A dressing was placed. Attention was directed to the right psoas fluid collection. Patient was slightly repositioned with the right side mildly elevated. Additional CT images were obtained of the abdomen and pelvis. The fluid collection was identified  in the superior aspect of the psoas muscle but there was not a percutaneous window to access this area. It was not clear if this fluid was tracking  along the inferior aspect of the psoas muscle. Plan was to aspirate this area to see if there is a connection with the more superior fluid collection. The right lateral abdomen was prepped and draped in sterile fashion. Skin was anesthetized using 1% lidocaine. Using CT guidance, 18 gauge trocar needle was directed into the right psoas muscle but only a small amount of bloody fluid could be aspirated. No purulent fluid was aspirated. As a result, this needle was removed. RADIATION DOSE REDUCTION: This exam was performed according to the departmental dose-optimization program which includes automated exposure control, adjustment of the mA and/or kV according to patient size and/or use of iterative reconstruction technique. FINDINGS: Complex left psoas abscess. Drain was placed in the more lateral aspect of the abscess and 20 mL of bloody purulent fluid was obtained. Another component to the abscess contains gas along the medial aspect of the left psoas. This collection was not immediately decompressed following drain placement. Low density collection involving the superomedial aspect of the right psoas muscle. No percutaneous window to access the superomedial aspect of the right psoas muscle. Needle was directed into the inferior aspect of the right psoas muscle but no purulent fluid could be aspirated from this area. IMPRESSION: 1. CT-guided placement of a drainage catheter within the left psoas abscess collection. Left psoas abscess collection appears to be complex or there is a second collection. Patient will need follow-up imaging to follow these left psoas collections. 2. The low-density collection along the superomedial aspect of the right psoas muscle could not be accessed. The inferior aspect of the right psoas muscle was aspirated but no purulent fluid was obtained. Electronically Signed   By: Richarda Overlie M.D.   On: 08/23/2022 09:07   CT ASPIRATION N/S  Result Date: 08/23/2022 INDICATION: 62 year old with  history of prostate abscess. Bilateral psoas fluid collections are concerning for abscesses. EXAM: 1. CT-guided placement of drainage catheter in left psoas abscess 2. CT-guided aspiration of right psoas muscle MEDICATIONS: Moderate sedation ANESTHESIA/SEDATION: Moderate (conscious) sedation was employed during this procedure. A total of Versed 3mg  and fentanyl 150 mcg was administered intravenously at the order of the provider performing the procedure. Total intra-service moderate sedation time: 61 minutes. Patient's level of consciousness and vital signs were monitored continuously by radiology nurse throughout the procedure under the supervision of the provider performing the procedure. COMPLICATIONS: None immediate. PROCEDURE: Informed written consent was obtained from the patient after a thorough discussion of the procedural risks, benefits and alternatives. All questions were addressed. Maximal Sterile Barrier Technique was utilized including caps, mask, sterile gowns, sterile gloves, sterile drape, hand hygiene and skin antiseptic. A timeout was performed prior to the initiation of the procedure. Patient was placed supine on the CT table and the left side was slightly elevated. CT images through the lower abdomen and pelvis were identified. The abnormal left psoas muscle was identified. The left lower quadrant of the abdomen was prepped with chlorhexidine and sterile field was created. Skin was anesthetized using 1% lidocaine. Using CT guidance, an 18 gauge trocar needle was directed into the lateral aspect of the left psoas muscle. Bloody purulent fluid was aspirated. Superstiff Amplatz wire was advanced into the muscle. The tract was dilated to accommodate a 10 drain. 20 mL of bloody purulent fluid was removed. Follow up CT images  were obtained. Drain was attached to a suction bulb and sutured to skin. Fluid was sent for culture. A dressing was placed. Attention was directed to the right psoas fluid  collection. Patient was slightly repositioned with the right side mildly elevated. Additional CT images were obtained of the abdomen and pelvis. The fluid collection was identified in the superior aspect of the psoas muscle but there was not a percutaneous window to access this area. It was not clear if this fluid was tracking along the inferior aspect of the psoas muscle. Plan was to aspirate this area to see if there is a connection with the more superior fluid collection. The right lateral abdomen was prepped and draped in sterile fashion. Skin was anesthetized using 1% lidocaine. Using CT guidance, 18 gauge trocar needle was directed into the right psoas muscle but only a small amount of bloody fluid could be aspirated. No purulent fluid was aspirated. As a result, this needle was removed. RADIATION DOSE REDUCTION: This exam was performed according to the departmental dose-optimization program which includes automated exposure control, adjustment of the mA and/or kV according to patient size and/or use of iterative reconstruction technique. FINDINGS: Complex left psoas abscess. Drain was placed in the more lateral aspect of the abscess and 20 mL of bloody purulent fluid was obtained. Another component to the abscess contains gas along the medial aspect of the left psoas. This collection was not immediately decompressed following drain placement. Low density collection involving the superomedial aspect of the right psoas muscle. No percutaneous window to access the superomedial aspect of the right psoas muscle. Needle was directed into the inferior aspect of the right psoas muscle but no purulent fluid could be aspirated from this area. IMPRESSION: 1. CT-guided placement of a drainage catheter within the left psoas abscess collection. Left psoas abscess collection appears to be complex or there is a second collection. Patient will need follow-up imaging to follow these left psoas collections. 2. The low-density  collection along the superomedial aspect of the right psoas muscle could not be accessed. The inferior aspect of the right psoas muscle was aspirated but no purulent fluid was obtained. Electronically Signed   By: Markus Daft M.D.   On: 08/23/2022 09:07   [place] EKG SITE RITE  Result Date: 08/22/2022 If Site Rite image not attached, placement could not be confirmed due to current cardiac rhythm.  CT ABDOMEN PELVIS W CONTRAST  Result Date: 08/21/2022 CLINICAL DATA:  Left lower quadrant and flank pain and known bilateral intramuscular iliopsoas abscesses and status post surgical unroofing bilateral prostate abscesses on 08/18/2022. EXAM: CT ABDOMEN AND PELVIS WITH CONTRAST TECHNIQUE: Multidetector CT imaging of the abdomen and pelvis was performed using the standard protocol following bolus administration of intravenous contrast. RADIATION DOSE REDUCTION: This exam was performed according to the departmental dose-optimization program which includes automated exposure control, adjustment of the mA and/or kV according to patient size and/or use of iterative reconstruction technique. CONTRAST:  163mL OMNIPAQUE IOHEXOL 300 MG/ML  SOLN COMPARISON:  Prior CT of the abdomen and pelvis on 08/17/2022 FINDINGS: Lower chest: No acute abnormality. Hepatobiliary: No focal liver abnormality is seen. No gallstones, gallbladder wall thickening, or biliary dilatation. Pancreas: Stable atrophic pancreas without inflammation. Spleen: Normal in size without focal abnormality. Adrenals/Urinary Tract: Adrenal glands are unremarkable. Kidneys are normal, without renal calculi, focal lesion, or hydronephrosis. Bladder is decompressed by a Foley catheter. Stomach/Bowel: Bowel shows no evidence of obstruction, ileus, inflammation or lesion. The appendix is not visualized. No  free intraperitoneal air. Vascular/Lymphatic: No vascular findings. No lymphadenopathy identified in the abdomen or pelvis. Reproductive: Prostate gland shows  significant improvement after unroofing with near resolution of abscesses. There remains a small left sided intra prostate fluid collection measuring up to 1.9 cm in transverse diameter. Other collections appear resolved. Other: Bilateral intramuscular ileo psoas collections remain. The elongated right-sided collection measures approximately 4.2 x 3.0 cm in greatest transverse dimensions at the level of the S1 vertebral body which is likely slightly larger compared to comparable measurements obtained currently 3.8 x 2.9 cm at the same level. At least 2 separate left-sided iliopsoas collections remain with the largest measuring roughly 4.1 cm in greatest diameter at the level of the mid sacrum. This collection appeared similar in size measuring approximately 4 cm at the same level on the prior study. Superiorly the collection may be slightly larger in diameter measuring 2.5 cm at the level of the L5 vertebral body compared to 1.9 cm previously. Musculoskeletal: No fractures or bony destruction. Stable degenerative disc disease in the lower lumbar spine. IMPRESSION: 1. Significant improvement in the appearance of the prostate gland after surgical unroofing with near resolution of abscesses. There remains a small left-sided intra prostate fluid collection measuring up to 1.9 cm in transverse diameter. 2. Bilateral iliopsoas fluid collections remain. The right-sided collection is likely slightly larger compared to the prior study. The left-sided iliopsoas collection is similar to slightly enlarged compared to the prior study. Electronically Signed   By: Irish Lack M.D.   On: 08/21/2022 11:16   DG CHEST PORT 1 VIEW  Result Date: 08/19/2022 CLINICAL DATA:  Central line placement EXAM: PORTABLE CHEST 1 VIEW COMPARISON:  08/13/2022 FINDINGS: The heart size and mediastinal contours are within normal limits. Unchanged appearance of a right neck vascular catheter, tip over the lower SVC. Diffuse bilateral interstitial  pulmonary opacity. The visualized skeletal structures are unremarkable. IMPRESSION: 1. Unchanged appearance of a right neck vascular catheter, tip over the lower SVC. 2. Diffuse bilateral interstitial pulmonary opacity, consistent with edema or atypical/viral infection. No new or focal airspace opacity. Electronically Signed   By: Jearld Lesch M.D.   On: 08/19/2022 15:50    Labs:  CBC: Recent Labs    08/19/22 1025 08/20/22 0543 08/21/22 1043 08/23/22 0556  WBC 17.8* 14.6* 14.3* 12.2*  HGB 10.5* 10.5* 10.6* 10.8*  HCT 34.1* 33.3* 33.1* 34.3*  PLT 231 252 312 330    COAGS: Recent Labs    08/17/22 0115  INR 1.4*    BMP: Recent Labs    08/19/22 1025 08/20/22 0543 08/21/22 1043 08/23/22 0556  NA 136 133* 132* 131*  K 3.6 3.1* 3.5 3.5  CL 102 99 94* 95*  CO2 27 27 30 28   GLUCOSE 195* 227* 248* 340*  BUN 12 11 11 17   CALCIUM 7.6* 7.4* 7.6* 7.6*  CREATININE 0.89 0.78 0.72 0.78  GFRNONAA >60 >60 >60 >60    LIVER FUNCTION TESTS: Recent Labs    08/17/22 0115 08/19/22 1025 08/20/22 0543 08/23/22 0556  BILITOT 0.5 0.5 0.7 0.5  AST 41 28 29 31   ALT 30 30 26 24   ALKPHOS 121 352* 322* 281*  PROT 5.2* 5.6* 5.3* 6.3*  ALBUMIN 1.6* 1.7* 1.7* 2.1*    Assessment and Plan: Pt with hx prostate/bilat psoas abscesses; s/p right psoas collection asp/left psoas collection drain placement 1/18 (10 fr to JP); afebrile; WBC 12.2(14.3), hgb stable, creat nl, drain fl cx pend; blood cx neg to date, urine cx -  staph; cont current tx/drain flushes, close output monitoring, lab checks; once drain output minimal over 2-3 day span or if WBC increases obtain f/u CT   Electronically Signed: D. Jeananne Rama, PA-C 08/23/2022, 2:05 PM   I spent a total of 15 Minutes at the the patient's bedside AND on the patient's hospital floor or unit, greater than 50% of which was counseling/coordinating care for left psoas fluid collection drain    Patient ID: William [name], male   DOB: [birth year],  62 y.o.   MRN: [id number]

## 2022-08-23 NOTE — Inpatient Diabetes Management (Signed)
Inpatient Diabetes Program Recommendations  AACE/ADA: New Consensus Statement on Inpatient Glycemic Control (2015)  Target Ranges:  Prepandial:   less than 140 mg/dL      Peak postprandial:   less than 180 mg/dL (1-2 hours)      Critically ill patients:  140 - 180 mg/dL   Lab Results  Component Value Date   GLUCAP 152 (H) 08/23/2022   HGBA1C 12.9 (H) 08/17/2022    Review of Glycemic Control  Diabetes history: DM2 Outpatient Diabetes medications: None Current orders for Inpatient glycemic control: Semglee 10 QHS, Novolog 0-15 TID with meals  HgbA1C - 12.9% When pt was at [facility] in [place] earlier this month, was on Lantus 50 units QD  Inpatient Diabetes Program Recommendations:    Increase Semglee to 25 units QHS  Add Novolog HS correction  Consider Novolog 4 units TID with meals if eating > 50%  Ordered Living Well with Diabetes book and Insulin pen starter kit. Spoke with pt at bedside regarding his diabetes. Pt states he needs to get better before we teach insulin pen administration. Has been off all DM meds for over a year. Said he had change in insurance and didn't take care of himself. Discussed HgbA1C of 12.9% and discussed importance of controlling blood sugars to prevent long and short-term complications. Pt voices understanding. Will teach insulin pen administration before discharge.   Follow glucose trends.   Thank you. Lorenda Peck, RD, LDN, [facility] Inpatient Diabetes Coordinator [phone]

## 2022-08-24 DIAGNOSIS — R7881 Bacteremia: Secondary | ICD-10-CM | POA: Diagnosis not present

## 2022-08-24 DIAGNOSIS — N412 Abscess of prostate: Secondary | ICD-10-CM | POA: Diagnosis not present

## 2022-08-24 DIAGNOSIS — M86072 Acute hematogenous osteomyelitis, left ankle and foot: Secondary | ICD-10-CM | POA: Diagnosis not present

## 2022-08-24 DIAGNOSIS — K6812 Psoas muscle abscess: Secondary | ICD-10-CM | POA: Diagnosis not present

## 2022-08-24 LAB — GLUCOSE, CAPILLARY
Glucose-Capillary: 224 mg/dL — ABNORMAL HIGH (ref 70–99)
Glucose-Capillary: 193 mg/dL — ABNORMAL HIGH (ref 70–99)
Glucose-Capillary: 181 mg/dL — ABNORMAL HIGH (ref 70–99)
Glucose-Capillary: 180 mg/dL — ABNORMAL HIGH (ref 70–99)

## 2022-08-24 LAB — CBC
HCT: 32.1 % — ABNORMAL LOW (ref 39.0–52.0)
Hemoglobin: 10.4 g/dL — ABNORMAL LOW (ref 13.0–17.0)
MCH: 27.2 pg (ref 26.0–34.0)
MCHC: 32.4 g/dL (ref 30.0–36.0)
MCV: 83.8 fL (ref 80.0–100.0)
Platelets: 317 10*3/uL (ref 150–400)
RBC: 3.83 MIL/uL — ABNORMAL LOW (ref 4.22–5.81)
RDW: 15.2 % (ref 11.5–15.5)
WBC: 12.7 10*3/uL — ABNORMAL HIGH (ref 4.0–10.5)
nRBC: 0 % (ref 0.0–0.2)

## 2022-08-24 MED ORDER — INSULIN GLARGINE-YFGN 100 UNIT/ML ~~LOC~~ SOLN
15.0000 [IU] | Freq: Every day | SUBCUTANEOUS | Status: DC
  Administered 2022-08-24 – 2022-08-25 (×2): 15 [IU] via SUBCUTANEOUS
  Filled 2022-08-24 (×2): qty 0.15

## 2022-08-24 NOTE — Progress Notes (Signed)
ID Brief note     Component 2 d ago  Specimen Description ABSCESS Performed at [facility] [address], [place], [place] [address]  Special Requests LEFT PSOAS  Gram Stain ABUNDANT WBC PRESENT, PREDOMINANTLY PMN MODERATE GRAM POSITIVE COCCI IN PAIRS  Culture FEW STAPHYLOCOCCUS AUREUS SUSCEPTIBILITIES TO FOLLOW Performed at [facility], [place] [address], [place], [place] [address]  Report Status PENDING     On cefazolin  Rosiland Oz, MD Infectious Disease Physician [facility] [address] [place], [place] [address] Phone: [phone]  Fax: [phone]

## 2022-08-24 NOTE — Progress Notes (Signed)
Inpatient Rehab Admissions Coordinator:   Per therapy recommendations, patient was screened for CIR candidacy by Mecca Guitron, MS, CCC-SLP. At this time, Pt. is not yet at a level to tolerate the intensity of CIR.  Pt. may have potential to progress to becoming a potential CIR candidate, so CIR admissions team will follow and monitor for progress and participation with therapies and place consult order if Pt. appears to be an appropriate candidate. Please contact me with any questions.   Nihal Marzella, MS, CCC-SLP Rehab Admissions Coordinator  [phone] (celll) [phone] (office)   

## 2022-08-24 NOTE — Progress Notes (Signed)
[facility]  PROGRESS NOTE  William [name] ZOX:[id number] DOB: Dec 06, 1960 DOA: 08/16/2022 PCP: Pcp, No   Brief HPI:     62 y.o. M with DM, HTN, MO, as well as recent osteomyelitis of the left great toe status post amputation, who was transferred from outside hospital for prostatic abscess.2 weeks prior to admission patient diagnosed with prostatitis, treated with Cipro for MSSA. 1 week ago, patient presented to outside hospital with atrial fibrillation, sepsis symptomology, found to have MSSA bacteremia, iliopsoas and prostatic abscesses.   Patient was transferred to [facility], urology and IR was consulted.  Patient was taken to the OR for prostatic abscess treatment.  ID was consulted.  Subjective   Patient seen and examined, no new complaints.   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 -Will need repeat CT after few days of antibiotics to check for resolution of this abscess  Prostatic abscess S/p TURP, prostate abscess unroofing 1/14 by Dr. Louis Meckel -Foley catheter was just continued per urology recommendation on 06/22/2023 - will discontinue foley as per  urology recommendation   Osteomyelitis of left foot  S/p LEFT great toe partial amputation on 1/11 by Dr. Rocco Serene up at [facility] Discussed with Dr. Rocco Serene, surgeon 1/15 by phone Changed post-op dressing on 1/15, toe looks good, no drainage, dehiscence   Dr. Rocco Serene recommended: - Change dressing daily -- xeroform, wrapped in gauze, covered in ACE wrap - WBAT in post op shoe - After two weeks (so around Jan 25) stop Xeroform in dressing and use adaptic - Leave sutures in place for 1 month (remove Feb 11) - Follow up with Dr. Rocco Serene in [place] within 1 month, preferably within a week of discharge to home or SNF  Hyperthyroidism Abnormal thyroid testing at OSH and briefly on PTU there.   Repeat TSH here normal -T3- 65, free T4- 1.67 -Follow-up endocrinology as outpatient   Hyponatremia Mild   Hypokalemia - replete   Type 2 diabetes mellitus (HCC) Glucoses controlled, A1c >12% - Continue Ss corrections -CBG has been elevated; improved after starting Semglee 10 units subcu nightly -Will increase dose of Semglee to 15 units subcu daily    Severe obstructive sleep apnea - CPAP at night   Paroxysmal atrial fibrillation ([place]) New diagnosis at last admission.  CHA2DS2-Vasc 1.   - Defer anticoagulation for now given low CHADS - Monitor on tele     Normocytic anemia Hgb 10 in setting of recent orthopedic surgery   Morbid obesity ([place]) BMI 47, unclear if he has malnutrition, consult dietitian   Hypercholesteremia Not on statin       Medications     Chlorhexidine Gluconate Cloth  6 each Topical Daily   insulin aspart  0-15 Units Subcutaneous TID WC   insulin glargine-yfgn  10 Units Subcutaneous  QHS   multivitamin with minerals  1 tablet Oral Daily   Ensure Max Protein  11 oz Oral BID   senna-docusate  2 tablet Oral BID   sodium chloride flush  10-40 mL Intracatheter Q12H   sodium chloride flush  5 mL Intracatheter Q8H   tamsulosin  0.4 mg Oral Daily     Data  Reviewed:   CBG:  Recent Labs  Lab 08/23/22 1251 08/23/22 1702 08/23/22 2129 08/24/22 0734 08/24/22 1203  GLUCAP 152* 171* 277* 224* 193*    SpO2: 92 % O2 Flow Rate (L/min): 2 L/min    Vitals:   08/24/22 0500 08/24/22 0520 08/24/22 0845 08/24/22 1246  BP:  134/89    Pulse:  91    Resp:  18    Temp:  98.6 F (37 C)    TempSrc:  Oral    SpO2:  92%    Weight: (!) 177.1 kg  (!) 177.8 kg   Height:    6\' 4"  (1.93 m)      Data Reviewed:  Basic Metabolic Panel: Recent Labs  Lab 08/18/22 0511 08/19/22 1025 08/20/22 0543 08/21/22 1043 08/23/22 0556  NA 136 136 133* 132* 131*  K 3.3* 3.6 3.1* 3.5 3.5  CL 104 102 99 94* 95*  CO2 25 27 27 30 28   GLUCOSE 106* 195* 227* 248* 340*  BUN 12 12 11 11 17   CREATININE 0.79 0.89 0.78 0.72 0.78  CALCIUM 7.5* 7.6* 7.4* 7.6* 7.6*    CBC: Recent Labs  Lab 08/19/22 1025 08/20/22 0543 08/21/22 1043 08/23/22 0556 08/24/22 0628  WBC 17.8* 14.6* 14.3* 12.2* 12.7*  HGB 10.5* 10.5* 10.6* 10.8* 10.4*  HCT 34.1* 33.3* 33.1* 34.3* 32.1*  MCV 85.9 83.0 82.3 82.3 83.8  PLT 231 252 312 330 317    LFT Recent Labs  Lab 08/19/22 1025 08/20/22 0543 08/23/22 0556  AST 28 29 31   ALT 30 26 24   ALKPHOS 352* 322* 281*  BILITOT 0.5 0.7 0.5  PROT 5.6* 5.3* 6.3*  ALBUMIN 1.7* 1.7* 2.1*     Antibiotics: Anti-infectives (From admission, onward)    Start     Dose/Rate Route Frequency Ordered Stop   08/18/22 2200  ceFAZolin (ANCEF) IVPB 2g/100 mL premix        2 g 200 mL/hr over 30 Minutes Intravenous Every 8 hours 08/18/22 1712     08/18/22 0200  cefTRIAXone (ROCEPHIN) 2 g in sodium chloride 0.9 % 100 mL IVPB  Status:  Discontinued        2 g 200 mL/hr over 30 Minutes Intravenous Every 24 hours 08/17/22 1304 08/18/22 1712   08/17/22 1400  vancomycin (VANCOREADY) IVPB 1750 mg/350 mL  Status:  Discontinued        1,750 mg 175 mL/hr over 120 Minutes Intravenous Every 12 hours 08/17/22 0049 08/18/22 1712   08/17/22 0130   cefTRIAXone (ROCEPHIN) 1 g in sodium chloride 0.9 % 100 mL IVPB  Status:  Discontinued        1 g 200 mL/hr over 30 Minutes Intravenous Every 24 hours 08/17/22 0039 08/17/22 1304   08/17/22 0130  vancomycin (VANCOREADY) IVPB 2000 mg/400 mL        2,000 mg 200 mL/hr over 120 Minutes Intravenous  Once 08/17/22 0042 08/17/22 0418        DVT prophylaxis: SCDs  Code Status: Full code  Family Communication: No family at bedside   CONSULTS    Objective    Physical Examination:  General-appears in no  acute distress Heart-S1-S2, regular, no murmur auscultated Lungs-clear to auscultation bilaterally, no wheezing or crackles auscultated Abdomen-soft, nontender, no organomegaly Extremities-no edema in the lower extremities    Status is: Inpatient:       Meredeth Ide   [facility] If 7PM-7AM, please contact night-coverage at [email or web address], Office  [phone]   08/24/2022, 1:13 PM  LOS: 8 days

## 2022-08-24 NOTE — Progress Notes (Signed)
PT refused CPAP.  

## 2022-08-25 DIAGNOSIS — R7881 Bacteremia: Secondary | ICD-10-CM | POA: Diagnosis not present

## 2022-08-25 DIAGNOSIS — K6812 Psoas muscle abscess: Secondary | ICD-10-CM | POA: Diagnosis not present

## 2022-08-25 DIAGNOSIS — N412 Abscess of prostate: Secondary | ICD-10-CM | POA: Diagnosis not present

## 2022-08-25 DIAGNOSIS — M86072 Acute hematogenous osteomyelitis, left ankle and foot: Secondary | ICD-10-CM | POA: Diagnosis not present

## 2022-08-25 LAB — BASIC METABOLIC PANEL
Anion gap: 8 (ref 5–15)
BUN: 15 mg/dL (ref 6–20)
CO2: 29 mmol/L (ref 22–32)
Calcium: 7.8 mg/dL — ABNORMAL LOW (ref 8.9–10.3)
Chloride: 97 mmol/L — ABNORMAL LOW (ref 98–111)
Creatinine, Ser: 0.75 mg/dL (ref 0.61–1.24)
GFR, Estimated: >60 mL/min (ref >60)
Glucose, Bld: 175 mg/dL — ABNORMAL HIGH (ref 70–99)
Potassium: 3.4 mmol/L — ABNORMAL LOW (ref 3.5–5.1)
Sodium: 134 mmol/L — ABNORMAL LOW (ref 135–145)

## 2022-08-25 LAB — GLUCOSE, CAPILLARY
Glucose-Capillary: 190 mg/dL — ABNORMAL HIGH (ref 70–99)
Glucose-Capillary: 186 mg/dL — ABNORMAL HIGH (ref 70–99)
Glucose-Capillary: 196 mg/dL — ABNORMAL HIGH (ref 70–99)
Glucose-Capillary: 212 mg/dL — ABNORMAL HIGH (ref 70–99)

## 2022-08-25 MED ORDER — OXYCODONE HCL 5 MG PO TABS
10.0000 mg | ORAL_TABLET | ORAL | Status: DC | PRN
  Administered 2022-08-25 – 2022-09-06 (×46): 10 mg via ORAL
  Filled 2022-08-25 (×48): qty 2

## 2022-08-25 MED ORDER — POTASSIUM CHLORIDE CRYS ER 20 MEQ PO TBCR
40.0000 meq | EXTENDED_RELEASE_TABLET | Freq: Once | ORAL | Status: AC
  Administered 2022-08-25: 40 meq via ORAL
  Filled 2022-08-25: qty 2

## 2022-08-25 NOTE — Progress Notes (Signed)
ID brief note      Component 3 d ago  Specimen Description ABSCESS Performed at [facility] [address] [place], [place] [address]  Special Requests LEFT PSOAS  Gram Stain ABUNDANT WBC PRESENT, PREDOMINANTLY PMN MODERATE GRAM POSITIVE COCCI IN PAIRS Performed at [facility], [place] [address], [place], [place] [address]  Culture FEW STAPHYLOCOCCUS AUREUS SUSCEPTIBILITIES TO FOLLOW NO ANAEROBES ISOLATED; CULTURE IN PROGRESS FOR 5 DAYS  Report Status PENDING     Rosiland Oz, MD Infectious Disease Physician [facility] [address] [place], [place] [address] Phone: [phone]  Fax: [phone]

## 2022-08-25 NOTE — Progress Notes (Signed)
Patient has called multiple times stating his bed has deflated, assessed bed and it is not deflated but from repositioning to left and right turns and goes to alternating pressure on head, trunk and leg, charge nurse has been informed of patient's complaint and facility has checked but since it is a specialized bed, it has to be checked by provider in daytime.

## 2022-08-25 NOTE — Progress Notes (Signed)
Patient requested that when it comes time for discharge he would like to go to [facility] in [place]. That would be hid first choice.

## 2022-08-25 NOTE — Plan of Care (Signed)
  Problem: Clinical Measurements: Goal: Will remain free from infection Outcome: Not Progressing   Problem: Clinical Measurements: Goal: Diagnostic test results will improve Outcome: Not Progressing   Problem: Activity: Goal: Risk for activity intolerance will decrease Outcome: Not Progressing   Problem: Pain Managment: Goal: General experience of comfort will improve Outcome: Not Progressing   

## 2022-08-25 NOTE — Progress Notes (Signed)
[facility]  PROGRESS NOTE  William [name] NIO:[id number] DOB: 22-Apr-1961 DOA: 08/16/2022 PCP: Pcp, No   Brief HPI:     62 y.o. M with DM, HTN, MO, as well as recent osteomyelitis of the left great toe status post amputation, who was transferred from outside hospital for prostatic abscess.2 weeks prior to admission patient diagnosed with prostatitis, treated with Cipro for MSSA. 1 week ago, patient presented to outside hospital with atrial fibrillation, sepsis symptomology, found to have MSSA bacteremia, iliopsoas and prostatic abscesses.   Patient was transferred to [facility], urology and IR was consulted.  Patient was taken to the OR for prostatic abscess treatment.  ID was consulted.  Subjective   Patient seen and examined, still complains of left flank pain.   Assessment/Plan:   MSSA bacteremia -Patient had positive blood culture at [facility] ER for MSSA in 2 out of 2 bottles -Blood culture on 1/13 drawn here was negative to date -Source likely due to ulcer with dissemination to prostate/psoas/vertebral -TTE was negative for vegetation -Given psoas involvement, so presumed to have lumbar osteomyelitis with spillover into psoas -ID anticipates 6 weeks of treatment with IV antibiotics; plan for TEE on 08/27/2022  Bilateral psoas fluid collection -CT abdomen/pelvis obtained today shows bilateral iliopsoas fluid collection, right-sided collection slightly larger as compared to left -IR consulted for CT-guided aspiration and possible drainage of fluid collection -Underwent drainage and drain placement of left psoas abscess, attempted right psoas abscess - drainage but could not be done due to poor percutaneous window. -Will go up on the dose of oxycodone to 10 mg p.o. every 4 hours as needed's -Will need repeat CT after few days of antibiotics to check for resolution of this abscess  Prostatic abscess S/p TURP, prostate abscess unroofing 1/14 by Dr. [name] -Foley catheter  was just continued per urology recommendation on 06/22/2023 - will discontinue foley as per urology recommendation   Osteomyelitis of left foot  S/p LEFT great toe partial amputation on 1/11 by Dr. [name] up at [facility] Discussed with Dr. [name], surgeon 1/15 by phone Changed post-op dressing on 1/15, toe looks good, no drainage, dehiscence   Dr. [name] recommended: - Change dressing daily -- xeroform, wrapped in gauze, covered in ACE wrap - WBAT in post op shoe - After two weeks (so around Jan 25) stop Xeroform in dressing and use adaptic - Leave sutures in place for 1 month (remove Feb 11) - Follow up with Dr. [name] in [place] within 1 month, preferably within a week of discharge to home or SNF  Hyperthyroidism Abnormal thyroid testing at OSH and briefly on PTU there.   Repeat TSH here normal -T3- 65, free T4- 1.67 -Follow-up endocrinology as outpatient   Hyponatremia Mild   Hypokalemia - Potassium was 3.4 this morning -Will replace potassium and follow BMP in am   Type 2 diabetes mellitus (HCC) Glucoses controlled, A1c >12% - Continue Ss corrections -CBG has been elevated; improved after starting Semglee 15 units subcu nightly     Severe obstructive sleep apnea - CPAP at night   Paroxysmal atrial fibrillation (HCC) New diagnosis at last admission.  CHA2DS2-Vasc 1.   - Defer anticoagulation for now given low CHADS - Monitor on tele     Normocytic anemia Hgb 10 in setting of recent orthopedic surgery   Morbid obesity (HCC) BMI 47, unclear if he has malnutrition, consult dietitian   Hypercholesteremia Not on statin       Medications     Chlorhexidine Gluconate  Cloth  6 each Topical Daily   insulin aspart  0-15 Units Subcutaneous TID WC   insulin glargine-yfgn  15 Units Subcutaneous QHS   multivitamin with minerals  1 tablet Oral Daily   Ensure Max Protein  11 oz Oral BID   senna-docusate  2 tablet Oral BID   sodium chloride flush  10-40 mL  Intracatheter Q12H   sodium chloride flush  5 mL Intracatheter Q8H   tamsulosin  0.4 mg Oral Daily     Data Reviewed:   CBG:  Recent Labs  Lab 08/24/22 0734 08/24/22 1203 08/24/22 1625 08/24/22 2201 08/25/22 0825  GLUCAP 224* 193* 181* 180* 190*    SpO2: 95 % O2 Flow Rate (L/min): 2 L/min    Vitals:   08/24/22 0845 08/24/22 1246 08/24/22 1455 08/25/22 0540  BP:   (!) 110/55 (!) 152/84  Pulse:   (!) 49 90  Resp:   19   Temp:   98.1 F (36.7 C) 98.4 F (36.9 C)  TempSrc:   Oral Oral  SpO2:   95% 95%  Weight: (!) 177.8 kg   (!) 177.1 kg  Height:  6\' 4"  (1.93 m)        Data Reviewed:  Basic Metabolic Panel: Recent Labs  Lab 08/19/22 1025 08/20/22 0543 08/21/22 1043 08/23/22 0556 08/25/22 0539  NA 136 133* 132* 131* 134*  K 3.6 3.1* 3.5 3.5 3.4*  CL 102 99 94* 95* 97*  CO2 27 27 30 28 29   GLUCOSE 195* 227* 248* 340* 175*  BUN 12 11 11 17 15   CREATININE 0.89 0.78 0.72 0.78 0.75  CALCIUM 7.6* 7.4* 7.6* 7.6* 7.8*    CBC: Recent Labs  Lab 08/19/22 1025 08/20/22 0543 08/21/22 1043 08/23/22 0556 08/24/22 0628  WBC 17.8* 14.6* 14.3* 12.2* 12.7*  HGB 10.5* 10.5* 10.6* 10.8* 10.4*  HCT 34.1* 33.3* 33.1* 34.3* 32.1*  MCV 85.9 83.0 82.3 82.3 83.8  PLT 231 252 312 330 317    LFT Recent Labs  Lab 08/19/22 1025 08/20/22 0543 08/23/22 0556  AST 28 29 31   ALT 30 26 24   ALKPHOS 352* 322* 281*  BILITOT 0.5 0.7 0.5  PROT 5.6* 5.3* 6.3*  ALBUMIN 1.7* 1.7* 2.1*     Antibiotics: Anti-infectives (From admission, onward)    Start     Dose/Rate Route Frequency Ordered Stop   08/18/22 2200  ceFAZolin (ANCEF) IVPB 2g/100 mL premix        2 g 200 mL/hr over 30 Minutes Intravenous Every 8 hours 08/18/22 1712     08/18/22 0200  cefTRIAXone (ROCEPHIN) 2 g in sodium chloride 0.9 % 100 mL IVPB  Status:  Discontinued        2 g 200 mL/hr over 30 Minutes Intravenous Every 24 hours 08/17/22 1304 08/18/22 1712   08/17/22 1400  vancomycin (VANCOREADY) IVPB 1750  mg/350 mL  Status:  Discontinued        1,750 mg 175 mL/hr over 120 Minutes Intravenous Every 12 hours 08/17/22 0049 08/18/22 1712   08/17/22 0130  cefTRIAXone (ROCEPHIN) 1 g in sodium chloride 0.9 % 100 mL IVPB  Status:  Discontinued        1 g 200 mL/hr over 30 Minutes Intravenous Every 24 hours 08/17/22 0039 08/17/22 1304   08/17/22 0130  vancomycin (VANCOREADY) IVPB 2000 mg/400 mL        2,000 mg 200 mL/hr over 120 Minutes Intravenous  Once 08/17/22 0042 08/17/22 0418        DVT  prophylaxis: SCDs  Code Status: Full code  Family Communication: No family at bedside   CONSULTS    Objective    Physical Examination:   Appears in no acute distress S1-S2, regular Clear to auscultation bilaterally Abdomen is soft, left drain in place for psoas abscess   Status is: Inpatient:       Oswald Hillock   [facility] If 7PM-7AM, please contact night-coverage at [email or web address], Office  [phone]   08/25/2022, 10:09 AM  LOS: 9 days

## 2022-08-26 ENCOUNTER — Other Ambulatory Visit (HOSPITAL_COMMUNITY): Payer: Self-pay

## 2022-08-26 DIAGNOSIS — K6812 Psoas muscle abscess: Secondary | ICD-10-CM | POA: Diagnosis not present

## 2022-08-26 DIAGNOSIS — M86072 Acute hematogenous osteomyelitis, left ankle and foot: Secondary | ICD-10-CM | POA: Diagnosis not present

## 2022-08-26 DIAGNOSIS — G4733 Obstructive sleep apnea (adult) (pediatric): Secondary | ICD-10-CM

## 2022-08-26 DIAGNOSIS — R7881 Bacteremia: Secondary | ICD-10-CM | POA: Diagnosis not present

## 2022-08-26 DIAGNOSIS — E0590 Thyrotoxicosis, unspecified without thyrotoxic crisis or storm: Secondary | ICD-10-CM | POA: Diagnosis not present

## 2022-08-26 DIAGNOSIS — B9561 Methicillin susceptible Staphylococcus aureus infection as the cause of diseases classified elsewhere: Secondary | ICD-10-CM | POA: Diagnosis not present

## 2022-08-26 LAB — GLUCOSE, CAPILLARY
Glucose-Capillary: 224 mg/dL — ABNORMAL HIGH (ref 70–99)
Glucose-Capillary: 173 mg/dL — ABNORMAL HIGH (ref 70–99)
Glucose-Capillary: 161 mg/dL — ABNORMAL HIGH (ref 70–99)
Glucose-Capillary: 219 mg/dL — ABNORMAL HIGH (ref 70–99)

## 2022-08-26 LAB — CBC
HCT: 32.1 % — ABNORMAL LOW (ref 39.0–52.0)
Hemoglobin: 10.2 g/dL — ABNORMAL LOW (ref 13.0–17.0)
MCH: 26.6 pg (ref 26.0–34.0)
MCHC: 31.8 g/dL (ref 30.0–36.0)
MCV: 83.8 fL (ref 80.0–100.0)
Platelets: 277 10*3/uL (ref 150–400)
RBC: 3.83 MIL/uL — ABNORMAL LOW (ref 4.22–5.81)
RDW: 15.7 % — ABNORMAL HIGH (ref 11.5–15.5)
WBC: 11.2 10*3/uL — ABNORMAL HIGH (ref 4.0–10.5)
nRBC: 0 % (ref 0.0–0.2)

## 2022-08-26 LAB — COMPREHENSIVE METABOLIC PANEL
ALT: 24 U/L (ref 0–44)
AST: 25 U/L (ref 15–41)
Albumin: 2.4 g/dL — ABNORMAL LOW (ref 3.5–5.0)
Alkaline Phosphatase: 234 U/L — ABNORMAL HIGH (ref 38–126)
Anion gap: 5 (ref 5–15)
BUN: 18 mg/dL (ref 6–20)
CO2: 28 mmol/L (ref 22–32)
Calcium: 8 mg/dL — ABNORMAL LOW (ref 8.9–10.3)
Chloride: 98 mmol/L (ref 98–111)
Creatinine, Ser: 0.7 mg/dL (ref 0.61–1.24)
GFR, Estimated: >60 mL/min (ref >60)
Glucose, Bld: 206 mg/dL — ABNORMAL HIGH (ref 70–99)
Potassium: 4 mmol/L (ref 3.5–5.1)
Sodium: 131 mmol/L — ABNORMAL LOW (ref 135–145)
Total Bilirubin: 0.5 mg/dL (ref 0.3–1.2)
Total Protein: 6.6 g/dL (ref 6.5–8.1)

## 2022-08-26 MED ORDER — INSULIN ASPART 100 UNIT/ML IJ SOLN
3.0000 [IU] | Freq: Three times a day (TID) | INTRAMUSCULAR | Status: DC
  Administered 2022-08-26 – 2022-09-06 (×23): 3 [IU] via SUBCUTANEOUS

## 2022-08-26 MED ORDER — INSULIN GLARGINE-YFGN 100 UNIT/ML ~~LOC~~ SOLN
18.0000 [IU] | Freq: Every day | SUBCUTANEOUS | Status: DC
  Administered 2022-08-26 – 2022-09-05 (×11): 18 [IU] via SUBCUTANEOUS
  Filled 2022-08-26 (×12): qty 0.18

## 2022-08-26 NOTE — Progress Notes (Addendum)
   Shared Decision Making/Informed Consent  The risks [esophageal damage, perforation (1:10,000 risk), bleeding, pharyngeal hematoma as well as other potential complications associated with conscious sedation including aspiration, arrhythmia, respiratory failure and death], benefits (treatment guidance and diagnostic support) and alternatives of a transesophageal echocardiogram were discussed in detail with William [name] and he is willing to proceed.    TEE scheduled for tomorrow 08/27/22,  NPO after midnight, orders placed. INR 1.4 on 08/17/22.

## 2022-08-26 NOTE — Inpatient Diabetes Management (Addendum)
Inpatient Diabetes Program Recommendations  AACE/ADA: New Consensus Statement on Inpatient Glycemic Control (2015)  Target Ranges:  Prepandial:   less than 140 mg/dL      Peak postprandial:   less than 180 mg/dL (1-2 hours)      Critically ill patients:  140 - 180 mg/dL    Latest Reference Range & Units 08/17/22 01:15  Hemoglobin A1C 4.8 - 5.6 % 12.9 (H)  323 mg/dl  (H): Data is abnormally high  Latest Reference Range & Units 08/25/22 08:25 08/25/22 12:14 08/25/22 17:37 08/25/22 22:32  Glucose-Capillary 70 - 99 mg/dL 190 (H)  3 units Novolog @1020  186 (H)  3 units Novolog @1422  196 (H)  3 units Novolog  212 (H)    15 units Semglee @2117   (H): Data is abnormally high  Latest Reference Range & Units 08/26/22 07:36  Glucose-Capillary 70 - 99 mg/dL 224 (H)  5 units Novolog   (H): Data is abnormally high   Admit with:  MSSA bacteremia Bilateral psoas fluid collection Prostatic abscess Osteomyelitis of left foot  S/p LEFT great toe partial amputation on 1/11 @ [facility]  History: DM2  Home DM Meds: None  Current Orders: Semglee 15 units QHS    Novolog 0-15 TID with meals    MD- Note pt getting solid PO diet + Ensure Protein supps BID.  CBGs remain >150.    Please consider:  1. Increase Semglee to 18 units QHS  2. Start Novolog Meal Coverage:  Novolog 3 units TID with meals HOLD if pt eats <50% meals HOLD if pt NPO  3. Pt needs CBG meter when he goes home as well    Addendum 1:30pm--Met w/ pt at bedside this afternoon to review going home on insulin (wife listening in on phone as well).  Spoke with patient about his current A1c of 12.9%.  Explained what an A1c is and what it measures.  Reminded patient that his goal A1c is 7% or less per ADA standards to prevent both acute and long-term complications and also to help pt heal from all his current infections.  Explained to patient the extreme importance of good glucose control at home.  Encouraged patient to  check his CBGs at least TID AC at home and to record all CBGs in a logbook for his PCP to review.  Pt told me he does not have CBG meter at home--Will need Rx for one when he finally goes home.  Pt also told me he used to use insulin back in 2019 (thinks it was Lantus pen).  Re-Educated patient and spouse on insulin pen use at home.  Reviewed all steps of insulin pen including attachment of needle, 2-unit air shot, dialing up dose, giving injection, rotation of injection sites, removing needle, disposal of sharps, storage of unused insulin, disposal of insulin etc.  Patient able to provide successful return demonstration.  Reviewed troubleshooting with insulin pen.  Also reviewed Signs/Symptoms of Hypoglycemia with patient and how to treat Hypoglycemia at home.  Have asked RNs caring for patient to please allow patient to give all injections here in hospital as much as possible for practice.  MD to give patient Rxs for insulin pens and insulin pen needles.   Pt may go to SNF for Rehab after d/c but knows that he will need to self inject and check CBGs when he finally goes home.  Pt agreeable to restart insulin at home.      --Will follow patient during hospitalization--  William Quaker  RN, MSN, [place] Diabetes Coordinator Inpatient Glycemic Control Team Team Pager: [phone] (8a-5p)

## 2022-08-26 NOTE — H&P (View-Only) (Signed)
[facility]  Date of Admission:  08/16/2022     Principal Problem:   MSSA bacteremia Active Problems:   Hypercholesteremia   Morbid obesity ([place])   Prostatic abscess   Normocytic anemia   Paroxysmal atrial fibrillation (HCC)   Osteomyelitis of left foot (HCC)   Severe obstructive sleep apnea   Type 2 diabetes mellitus (HCC)   Possible psoas muscle infection   Hypokalemia   Hyperthyroidism   Hyponatremia   Malnutrition of moderate degree          Assessment: 57 YM with afib, hypothyroidism, DM2, admitted 1/08 to [facility] with severe sepsis found to have disseminated mssa infection and left great toe OM, complicated by dka, [facility] management of [facility] abscess.    # Disseminated MSSA bacteremia with  b/l psoas abscess, prostatic abscess and lungs - Blood cultures at [facility] ER on 1/8 grew 2/2 MSSA, blood cultures on 1/13 at our facility with no growth.   -CT at [facility] ER showed hypodensities in prostate gland measuring 3.5X 2.2 cm, in the right Hemi gland and 4X 3 cm in the left Hemi gland suspicious for prostatic abscess.  Right psoas abscess measuring 3.2X 2.7 cm likely reflecting developing abscess, small pelvic free fluid.  Right middle lobe groundglass opacity, partially imaged. - TTE at [facility] on 1/12 showed mildly thickened aortic leaflets. -Urology following, SP TURP/ptostate abscess unroofing on 1/14, foley placed -IR re-engaged as repeat CT on 1/17 showed b/l iliopsoas fluid collection, right side larger. On 1/18 pt underwent drain placment in left psoad abscess -removed 32ml bloody purulent fluid(Cx MSSA). Noted that left psoad abscess has second collection, and right posad collection not aspirated(no window avaialbe. Recc Follow-up CT.  -IJ out on 1/15 Recommendations: -Continue cefazolin  - PICC in placed on 1/19,repeat blood Cx on 1/18 NGx4d - Anticipate at least 6 weeks antibiotics, will get TEE  on 1/23.  Source of bacteremia was likely the left great toe leading to possible endocarditis metastatic/embolic infection. Will follow clinically to assess timing of re-imaging psoas abscess(Wednesday?)/     #Left great toe OM SP amputation on 1/11 with bone  Cx+ MSSA, Staph epi and Corynebacterium amycolatum -Will regard staph epi and corynebacterium as lily contaminant. Will target coverage for MSSA as above    Microbiology:   Antibiotics: Cefazolin 1/14- Ceftriaxone 1/12- Vanc 1/12- Cultures: Blood 1/13 ng Urine 1/14 MSSA   SUBJECTIVE: Resting in bed, nonew compalints Interval: Afebrile overnight.   Review of Systems: Review of Systems  All other systems reviewed and are negative.    Scheduled Meds:  Chlorhexidine Gluconate Cloth  6 each Topical Daily   insulin aspart  0-15 Units Subcutaneous TID WC   insulin aspart  3 Units Subcutaneous TID WC   insulin glargine-yfgn  18 Units Subcutaneous QHS   multivitamin with minerals  1 tablet Oral Daily   Ensure Max Protein  11 oz Oral BID   senna-docusate  2 tablet Oral BID   sodium chloride flush  10-40 mL Intracatheter Q12H   sodium chloride flush  5 mL Intracatheter Q8H   tamsulosin  0.4 mg Oral Daily   Continuous Infusions:   ceFAZolin (ANCEF) IV 2 g (08/26/22 1332)   sodium chloride irrigation     PRN Meds:.acetaminophen **OR** acetaminophen, bisacodyl, fentaNYL (SUBLIMAZE) injection, naLOXone (NARCAN)  injection, oxyCODONE, sodium chloride flush Allergies  Allergen Reactions   Diltiazem Hcl Other (See Comments)  Causes accelerated heart rate    OBJECTIVE: Vitals:   08/25/22 1119 08/25/22 2105 08/26/22 0500 08/26/22 1459  BP: 123/77 (!) 149/97 (!) 127/93 133/71  Pulse: (!) 102 100 95 67  Resp: 20 20 19 20   Temp: 98.3 F (36.8 C) 98.5 F (36.9 C) 98.2 F (36.8 C) 98 F (36.7 C)  TempSrc: Oral Oral Oral Oral  SpO2: 98% 98% 100% 95%  Weight:   (!) 172.5 kg   Height:       Body mass index is 46.29  kg/m.  Physical Exam Constitutional:      General: He is not in acute distress.    Appearance: He is normal weight. He is not toxic-appearing.  HENT:     Head: Normocephalic and atraumatic.     Right Ear: External ear normal.     Left Ear: External ear normal.     Nose: No congestion or rhinorrhea.     Mouth/Throat:     Mouth: Mucous membranes are moist.     Pharynx: Oropharynx is clear.  Eyes:     Extraocular Movements: Extraocular movements intact.     Conjunctiva/sclera: Conjunctivae normal.     Pupils: Pupils are equal, round, and reactive to light.  Cardiovascular:     Rate and Rhythm: Normal rate and regular rhythm.     Heart sounds: No murmur heard.    No friction rub. No gallop.  Pulmonary:     Effort: Pulmonary effort is normal.     Breath sounds: Normal breath sounds.  Abdominal:     General: Abdomen is flat. Bowel sounds are normal.     Palpations: Abdomen is soft.  Musculoskeletal:        General: No swelling. Normal range of motion.     Cervical back: Normal range of motion and neck supple.  Skin:    General: Skin is warm and dry.  Neurological:     General: No focal deficit present.     Mental Status: He is oriented to person, place, and time.  Psychiatric:        Mood and Affect: Mood normal.   Toe bandaged    Lab Results Lab Results  Component Value Date   WBC 11.2 (H) 08/26/2022   HGB 10.2 (L) 08/26/2022   HCT 32.1 (L) 08/26/2022   MCV 83.8 08/26/2022   PLT 277 08/26/2022    Lab Results  Component Value Date   CREATININE 0.70 08/26/2022   BUN 18 08/26/2022   NA 131 (L) 08/26/2022   K 4.0 08/26/2022   CL 98 08/26/2022   CO2 28 08/26/2022    Lab Results  Component Value Date   ALT 24 08/26/2022   AST 25 08/26/2022   ALKPHOS 234 (H) 08/26/2022   BILITOT 0.5 08/26/2022        Laurice Record, [place] [facility] 08/26/2022, 4:08 PM

## 2022-08-26 NOTE — Plan of Care (Signed)
  Problem: Clinical Measurements: Goal: Will remain free from infection Outcome: Not Progressing   Problem: Activity: Goal: Risk for activity intolerance will decrease Outcome: Not Progressing   Problem: Pain Managment: Goal: General experience of comfort will improve Outcome: Not Progressing

## 2022-08-26 NOTE — Progress Notes (Signed)
[facility]  PROGRESS NOTE  William [name] DGU:[id number] DOB: 04/20/1961 DOA: 08/16/2022 PCP: Pcp, No   Brief HPI:     62 y.o. M with DM, HTN, MO, as well as recent osteomyelitis of the left great toe status post amputation, who was transferred from outside hospital for prostatic abscess.2 weeks prior to admission patient diagnosed with prostatitis, treated with Cipro for MSSA. 1 week ago, patient presented to outside hospital with atrial fibrillation, sepsis symptomology, found to have MSSA bacteremia, iliopsoas and prostatic abscesses.   Patient was transferred to [facility], urology and IR was consulted.  Patient was taken to the OR for prostatic abscess treatment.  ID was consulted.  Subjective   Patient seen and examined, wants to go to rehab.   Assessment/Plan:   MSSA bacteremia -Patient had positive blood culture at [facility] for MSSA in 2 out of 2 bottles -Blood culture on 1/13 drawn here was negative to date -Source likely due to ulcer with dissemination to prostate/psoas/vertebral -TTE was negative for vegetation -Given psoas involvement, so presumed to have lumbar osteomyelitis with spillover into psoas -ID anticipates 6 weeks of treatment with IV antibiotics; plan for TEE on 08/27/2022  Bilateral psoas fluid collection -CT abdomen/pelvis obtained today shows bilateral iliopsoas fluid collection, right-sided collection slightly larger as compared to left -IR consulted for CT-guided aspiration and possible drainage of fluid collection -Underwent drainage and drain placement of left psoas abscess, attempted right psoas abscess - drainage but could not be done due to poor percutaneous window. -Will go up on the dose of oxycodone to 10 mg p.o. every 4 hours as needed's -Will need repeat CT after few days of antibiotics to check for resolution of this abscess  Prostatic abscess S/p TURP, prostate abscess unroofing 1/14 by Dr. Louis Meckel -Foley catheter was just  continued per urology recommendation on 06/22/2023 - will discontinue foley as per urology recommendation   Osteomyelitis of left foot  S/p LEFT great toe partial amputation on 1/11 by Dr. Rocco Serene up at [facility] Discussed with Dr. Rocco Serene, surgeon 1/15 by phone Changed post-op dressing on 1/15, toe looks good, no drainage, dehiscence   Dr. Rocco Serene recommended: - Change dressing daily -- xeroform, wrapped in gauze, covered in ACE wrap - WBAT in post op shoe - After two weeks (so around Jan 25) stop Xeroform in dressing and use adaptic - Leave sutures in place for 1 month (remove Feb 11) - Follow up with Dr. Rocco Serene in [place] within 1 month, preferably within a week of discharge to home or SNF  Hyperthyroidism Abnormal thyroid testing at OSH and briefly on PTU there.   Repeat TSH here normal -T3- 65, free T4- 1.67 -Follow-up endocrinology as outpatient   Hyponatremia Mild   Hypokalemia - Replete   Type 2 diabetes mellitus (HCC) Glucoses controlled, A1c >12% - Continue Ss corrections -CBG has been elevated; increase Semglee to 18 units subcu daily -Also start NovoLog 3 units 3 times daily meal coverage  Severe obstructive sleep apnea - CPAP at night   Paroxysmal atrial fibrillation ([place]) New diagnosis at last admission.  CHA2DS2-Vasc 1.   - Defer anticoagulation for now given low CHADS - Monitor on tele     Normocytic anemia Hgb 10 in setting of recent orthopedic surgery   Morbid obesity ([place]) BMI 47, unclear if he has malnutrition, consult dietitian   Hypercholesteremia Not on statin     Medications     Chlorhexidine Gluconate Cloth  6 each Topical Daily   insulin  aspart  0-15 Units Subcutaneous TID WC   insulin aspart  3 Units Subcutaneous TID WC   insulin glargine-yfgn  18 Units Subcutaneous QHS   multivitamin with minerals  1 tablet Oral Daily   Ensure Max Protein  11 oz Oral BID   senna-docusate  2 tablet Oral BID   sodium chloride flush  10-40 mL  Intracatheter Q12H   sodium chloride flush  5 mL Intracatheter Q8H   tamsulosin  0.4 mg Oral Daily     Data Reviewed:   CBG:  Recent Labs  Lab 08/25/22 0825 08/25/22 1214 08/25/22 1737 08/25/22 2232 08/26/22 0736  GLUCAP 190* 186* 196* 212* 224*    SpO2: 100 % O2 Flow Rate (L/min): 2 L/min    Vitals:   08/25/22 0540 08/25/22 1119 08/25/22 2105 08/26/22 0500  BP: (!) 152/84 123/77 (!) 149/97 (!) 127/93  Pulse: 90 (!) 102 100 95  Resp:  20 20 19   Temp: 98.4 F (36.9 C) 98.3 F (36.8 C) 98.5 F (36.9 C) 98.2 F (36.8 C)  TempSrc: Oral Oral Oral Oral  SpO2: 95% 98% 98% 100%  Weight: (!) 177.1 kg   (!) 172.5 kg  Height:          Data Reviewed:  Basic Metabolic Panel: Recent Labs  Lab 08/20/22 0543 08/21/22 1043 08/23/22 0556 08/25/22 0539 08/26/22 0500  NA 133* 132* 131* 134* 131*  K 3.1* 3.5 3.5 3.4* 4.0  CL 99 94* 95* 97* 98  CO2 27 30 28 29 28   GLUCOSE 227* 248* 340* 175* 206*  BUN 11 11 17 15 18   CREATININE 0.78 0.72 0.78 0.75 0.70  CALCIUM 7.4* 7.6* 7.6* 7.8* 8.0*    CBC: Recent Labs  Lab 08/20/22 0543 08/21/22 1043 08/23/22 0556 08/24/22 0628 08/26/22 0500  WBC 14.6* 14.3* 12.2* 12.7* 11.2*  HGB 10.5* 10.6* 10.8* 10.4* 10.2*  HCT 33.3* 33.1* 34.3* 32.1* 32.1*  MCV 83.0 82.3 82.3 83.8 83.8  PLT 252 312 330 317 277    LFT Recent Labs  Lab 08/19/22 1025 08/20/22 0543 08/23/22 0556 08/26/22 0500  AST 28 29 31 25   ALT 30 26 24 24   ALKPHOS 352* 322* 281* 234*  BILITOT 0.5 0.7 0.5 0.5  PROT 5.6* 5.3* 6.3* 6.6  ALBUMIN 1.7* 1.7* 2.1* 2.4*     Antibiotics: Anti-infectives (From admission, onward)    Start     Dose/Rate Route Frequency Ordered Stop   08/18/22 2200  ceFAZolin (ANCEF) IVPB 2g/100 mL premix        2 g 200 mL/hr over 30 Minutes Intravenous Every 8 hours 08/18/22 1712     08/18/22 0200  cefTRIAXone (ROCEPHIN) 2 g in sodium chloride 0.9 % 100 mL IVPB  Status:  Discontinued        2 g 200 mL/hr over 30 Minutes  Intravenous Every 24 hours 08/17/22 1304 08/18/22 1712   08/17/22 1400  vancomycin (VANCOREADY) IVPB 1750 mg/350 mL  Status:  Discontinued        1,750 mg 175 mL/hr over 120 Minutes Intravenous Every 12 hours 08/17/22 0049 08/18/22 1712   08/17/22 0130  cefTRIAXone (ROCEPHIN) 1 g in sodium chloride 0.9 % 100 mL IVPB  Status:  Discontinued        1 g 200 mL/hr over 30 Minutes Intravenous Every 24 hours 08/17/22 0039 08/17/22 1304   08/17/22 0130  vancomycin (VANCOREADY) IVPB 2000 mg/400 mL        2,000 mg 200 mL/hr over 120 Minutes Intravenous  Once 08/17/22 0042 08/17/22 0418        DVT prophylaxis: SCDs  Code Status: Full code  Family Communication: No family at bedside   CONSULTS    Objective    Physical Examination:  Appears in no acute distress Lungs are clear to auscultation bilaterally S1-S2, regular, no murmur auscultated Abdomen soft, nontender, drain in place in left flank    Status is: Inpatient:    Pressure Injury 08/25/22 Buttocks Medial;Mid Stage 2 -  Partial thickness loss of dermis presenting as a shallow open injury with a red, pink wound bed without slough. Circular, pink (Active)  08/25/22 1400  Location: Buttocks  Location Orientation: Medial;Mid  Staging: Stage 2 -  Partial thickness loss of dermis presenting as a shallow open injury with a red, pink wound bed without slough.  Wound Description (Comments): Circular, pink  Present on Admission:      Pressure Injury 08/25/22 Ischial tuberosity Right Stage 2 -  Partial thickness loss of dermis presenting as a shallow open injury with a red, pink wound bed without slough. Circular, pink (Active)  08/25/22 1400  Location: Ischial tuberosity  Location Orientation: Right  Staging: Stage 2 -  Partial thickness loss of dermis presenting as a shallow open injury with a red, pink wound bed without slough.  Wound Description (Comments): Circular, pink  Present on Admission:      Pressure Injury 08/25/22  Buttocks Right Stage 2 -  Partial thickness loss of dermis presenting as a shallow open injury with a red, pink wound bed without slough. Circula, pink (Active)  08/25/22 1400  Location: Buttocks  Location Orientation: Right  Staging: Stage 2 -  Partial thickness loss of dermis presenting as a shallow open injury with a red, pink wound bed without slough.  Wound Description (Comments): Circula, pink  Present on Admission:     [place]   [facility] If 7PM-7AM, please contact night-coverage at [email or web address], Office  [phone]   08/26/2022, 10:24 AM  LOS: 10 days

## 2022-08-26 NOTE — Progress Notes (Signed)
[facility]  Date of Admission:  08/16/2022     Principal Problem:   MSSA bacteremia Active Problems:   Hypercholesteremia   Morbid obesity ([place])   Prostatic abscess   Normocytic anemia   Paroxysmal atrial fibrillation (HCC)   Osteomyelitis of left foot (HCC)   Severe obstructive sleep apnea   Type 2 diabetes mellitus (HCC)   Possible psoas muscle infection   Hypokalemia   Hyperthyroidism   Hyponatremia   Malnutrition of moderate degree          Assessment: 57 YM with afib, hypothyroidism, DM2, admitted 1/08 to [facility] with severe sepsis found to have disseminated mssa infection and left great toe OM, complicated by dka, [facility] management of pro[facility] abscess.    # Disseminated MSSA bacteremia with  b/l psoas abscess, prostatic abscess and lungs - Blood cultures at [facility] ER on 1/8 grew 2/2 MSSA, blood cultures on 1/13 at our facility with no growth.   -CT at [facility] ER showed hypodensities in prostate gland measuring 3.5X 2.2 cm, in the right Hemi gland and 4X 3 cm in the left Hemi gland suspicious for prostatic abscess.  Right psoas abscess measuring 3.2X 2.7 cm likely reflecting developing abscess, small pelvic free fluid.  Right middle lobe groundglass opacity, partially imaged. - TTE at [facility] on 1/12 showed mildly thickened aortic leaflets. -Urology following, SP TURP/ptostate abscess unroofing on 1/14, foley placed -IR re-engaged as repeat CT on 1/17 showed b/l iliopsoas fluid collection, right side larger. On 1/18 pt underwent drain placment in left psoad abscess -removed 32ml bloody purulent fluid(Cx MSSA). Noted that left psoad abscess has second collection, and right posad collection not aspirated(no window avaialbe. Recc Follow-up CT.  -IJ out on 1/15 Recommendations: -Continue cefazolin  - PICC in placed on 1/19,repeat blood Cx on 1/18 NGx4d - Anticipate at least 6 weeks antibiotics, will get TEE  on 1/23.  Source of bacteremia was likely the left great toe leading to possible endocarditis metastatic/embolic infection. Will follow clinically to assess timing of re-imaging psoas abscess(Wednesday?)/     #Left great toe OM SP amputation on 1/11 with bone  Cx+ MSSA, Staph epi and Corynebacterium amycolatum -Will regard staph epi and corynebacterium as lily contaminant. Will target coverage for MSSA as above    Microbiology:   Antibiotics: Cefazolin 1/14- Ceftriaxone 1/12- Vanc 1/12- Cultures: Blood 1/13 ng Urine 1/14 MSSA   SUBJECTIVE: Resting in bed, nonew compalints Interval: Afebrile overnight.   Review of Systems: Review of Systems  All other systems reviewed and are negative.    Scheduled Meds:  Chlorhexidine Gluconate Cloth  6 each Topical Daily   insulin aspart  0-15 Units Subcutaneous TID WC   insulin aspart  3 Units Subcutaneous TID WC   insulin glargine-yfgn  18 Units Subcutaneous QHS   multivitamin with minerals  1 tablet Oral Daily   Ensure Max Protein  11 oz Oral BID   senna-docusate  2 tablet Oral BID   sodium chloride flush  10-40 mL Intracatheter Q12H   sodium chloride flush  5 mL Intracatheter Q8H   tamsulosin  0.4 mg Oral Daily   Continuous Infusions:   ceFAZolin (ANCEF) IV 2 g (08/26/22 1332)   sodium chloride irrigation     PRN Meds:.acetaminophen **OR** acetaminophen, bisacodyl, fentaNYL (SUBLIMAZE) injection, naLOXone (NARCAN)  injection, oxyCODONE, sodium chloride flush Allergies  Allergen Reactions   Diltiazem Hcl Other (See Comments)  Causes accelerated heart rate    OBJECTIVE: Vitals:   08/25/22 1119 08/25/22 2105 08/26/22 0500 08/26/22 1459  BP: 123/77 (!) 149/97 (!) 127/93 133/71  Pulse: (!) 102 100 95 67  Resp: 20 20 19 20  Temp: 98.3 F (36.8 C) 98.5 F (36.9 C) 98.2 F (36.8 C) 98 F (36.7 C)  TempSrc: Oral Oral Oral Oral  SpO2: 98% 98% 100% 95%  Weight:   (!) 172.5 kg   Height:       Body mass index is 46.29  kg/m.  Physical Exam Constitutional:      General: He is not in acute distress.    Appearance: He is normal weight. He is not toxic-appearing.  HENT:     Head: Normocephalic and atraumatic.     Right Ear: External ear normal.     Left Ear: External ear normal.     Nose: No congestion or rhinorrhea.     Mouth/Throat:     Mouth: Mucous membranes are moist.     Pharynx: Oropharynx is clear.  Eyes:     Extraocular Movements: Extraocular movements intact.     Conjunctiva/sclera: Conjunctivae normal.     Pupils: Pupils are equal, round, and reactive to light.  Cardiovascular:     Rate and Rhythm: Normal rate and regular rhythm.     Heart sounds: No murmur heard.    No friction rub. No gallop.  Pulmonary:     Effort: Pulmonary effort is normal.     Breath sounds: Normal breath sounds.  Abdominal:     General: Abdomen is flat. Bowel sounds are normal.     Palpations: Abdomen is soft.  Musculoskeletal:        General: No swelling. Normal range of motion.     Cervical back: Normal range of motion and neck supple.  Skin:    General: Skin is warm and dry.  Neurological:     General: No focal deficit present.     Mental Status: He is oriented to person, place, and time.  Psychiatric:        Mood and Affect: Mood normal.   Toe bandaged    Lab Results Lab Results  Component Value Date   WBC 11.2 (H) 08/26/2022   HGB 10.2 (L) 08/26/2022   HCT 32.1 (L) 08/26/2022   MCV 83.8 08/26/2022   PLT 277 08/26/2022    Lab Results  Component Value Date   CREATININE 0.70 08/26/2022   BUN 18 08/26/2022   NA 131 (L) 08/26/2022   K 4.0 08/26/2022   CL 98 08/26/2022   CO2 28 08/26/2022    Lab Results  Component Value Date   ALT 24 08/26/2022   AST 25 08/26/2022   ALKPHOS 234 (H) 08/26/2022   BILITOT 0.5 08/26/2022        William Artiga, MD [facility] 08/26/2022, 4:08 PM  

## 2022-08-27 ENCOUNTER — Inpatient Hospital Stay (HOSPITAL_COMMUNITY): Payer: Commercial Managed Care - PPO | Admitting: Anesthesiology

## 2022-08-27 ENCOUNTER — Inpatient Hospital Stay (HOSPITAL_COMMUNITY): Payer: Commercial Managed Care - PPO

## 2022-08-27 ENCOUNTER — Encounter (HOSPITAL_COMMUNITY)
Admission: AD | Disposition: A | Discharge status: Skilled Nursing Facility | Payer: Self-pay | Source: Other Acute Inpatient Hospital | Attending: Family Medicine

## 2022-08-27 ENCOUNTER — Encounter (HOSPITAL_COMMUNITY): Payer: Self-pay | Admitting: Internal Medicine

## 2022-08-27 DIAGNOSIS — R7881 Bacteremia: Secondary | ICD-10-CM | POA: Diagnosis not present

## 2022-08-27 DIAGNOSIS — I119 Hypertensive heart disease without heart failure: Secondary | ICD-10-CM | POA: Diagnosis not present

## 2022-08-27 DIAGNOSIS — N412 Abscess of prostate: Secondary | ICD-10-CM | POA: Diagnosis not present

## 2022-08-27 DIAGNOSIS — K6812 Psoas muscle abscess: Secondary | ICD-10-CM | POA: Diagnosis not present

## 2022-08-27 DIAGNOSIS — M86072 Acute hematogenous osteomyelitis, left ankle and foot: Secondary | ICD-10-CM | POA: Diagnosis not present

## 2022-08-27 DIAGNOSIS — Z6841 Body Mass Index (BMI) 40.0 and over, adult: Secondary | ICD-10-CM

## 2022-08-27 DIAGNOSIS — I4891 Unspecified atrial fibrillation: Secondary | ICD-10-CM | POA: Diagnosis not present

## 2022-08-27 HISTORY — PX: TEE WITHOUT CARDIOVERSION: SHX5443

## 2022-08-27 LAB — GLUCOSE, CAPILLARY
Glucose-Capillary: 197 mg/dL — ABNORMAL HIGH (ref 70–99)
Glucose-Capillary: 169 mg/dL — ABNORMAL HIGH (ref 70–99)
Glucose-Capillary: 158 mg/dL — ABNORMAL HIGH (ref 70–99)
Glucose-Capillary: 164 mg/dL — ABNORMAL HIGH (ref 70–99)
Glucose-Capillary: 140 mg/dL — ABNORMAL HIGH (ref 70–99)

## 2022-08-27 LAB — CULTURE, BLOOD (ROUTINE X 2)
Culture: NO GROWTH
Culture: NO GROWTH
Special Requests: ADEQUATE
Special Requests: ADEQUATE

## 2022-08-27 LAB — ECHO TEE

## 2022-08-27 LAB — AEROBIC/ANAEROBIC CULTURE W GRAM STAIN (SURGICAL/DEEP WOUND)

## 2022-08-27 SURGERY — ECHOCARDIOGRAM, TRANSESOPHAGEAL
Anesthesia: Monitor Anesthesia Care

## 2022-08-27 MED ORDER — PROPOFOL 500 MG/50ML IV EMUL
INTRAVENOUS | Status: DC | PRN
  Administered 2022-08-27: 75 ug/kg/min via INTRAVENOUS

## 2022-08-27 MED ORDER — SODIUM CHLORIDE 0.9 % IV SOLN: INTRAVENOUS | Status: DC

## 2022-08-27 MED ORDER — PROPOFOL 10 MG/ML IV BOLUS
INTRAVENOUS | Status: DC | PRN
  Administered 2022-08-27: 60 mg via INTRAVENOUS
  Administered 2022-08-27 (×2): 40 mg via INTRAVENOUS

## 2022-08-27 NOTE — Transfer of Care (Signed)
Immediate Anesthesia Transfer of Care Note  Patient: William [name]  Procedure(s) Performed: TRANSESOPHAGEAL ECHOCARDIOGRAM (TEE)  Patient Location: Endoscopy Unit  Anesthesia Type:MAC  Level of Consciousness: drowsy and patient cooperative  Airway & Oxygen Therapy: Patient Spontanous Breathing and Patient connected to nasal cannula oxygen  Post-op Assessment: Report given to RN, Post -op Vital signs reviewed and stable, and Patient moving all extremities X 4  Post vital signs: Reviewed and stable  Last Vitals:  Vitals Value Taken Time  BP 85/72   Temp    Pulse 89   Resp 16   SpO2 94     Last Pain:  Vitals:   08/27/22 1405  TempSrc:   PainSc: 4       Patients Stated Pain Goal: 1 (28/83/37 4451)  Complications: No notable events documented.

## 2022-08-27 NOTE — Progress Notes (Signed)
Physical Therapy Treatment Patient Details Name: William [name] MRN: [id number] DOB: [birth year] Today's Date: 08/27/2022   History of Present Illness Patient is 62 yo male admitted 08/16/22  for evaluation of persistent left-sided low back pain and pain along the suprapubic region , treated for dysuria recently, S/P PROSTATE ABSCESS UNROOFING, TRANSURETHRAL RESECTION OF THE PROSTATE. PMH: right great toe amputation,DM,HTN.previous history:On 08/12/2022 the patient was admitted to the outside hospital with DKA a and A-fib with RVR.  He was complaining of weakness shortness of breath.  He was noted to be hypoxic and his blood sugar was over 500.  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  Further evaluation with CT scan subsequently demonstrated possible prostatic abscess and psoas abscess on the left.    PT Comments    The patient stating that he wants to stand, Patient assisted with 2  max/total to sit on bed edge foer ~ 2 minutes. Patient requires trunk support. Patient reporting increased pain in the groin  and back. Assisted back to bed. Patient continues to demonstrate profound weakness of the LE's. Patient inconsistent with sensory testing of the legs.   Patient will benefit from transfer to a room with a maxisky in order to safely get patient OOB. Currently unable to sit and balance , nor stand.   Recommendations for follow up therapy are one component of a multi-disciplinary discharge planning process, led by the attending physician.  Recommendations may be updated based on patient status, additional functional criteria and insurance authorization.  Follow Up Recommendations  Acute inpatient rehab (3hours/day) Can patient physically be transported by private vehicle: No   Assistance Recommended at Discharge Frequent or constant Supervision/Assistance  Patient can return home with the following Two people to help  with walking and/or transfers;A lot of help with bathing/dressing/bathroom;Assist for transportation;Help with stairs or ramp for entrance;Assistance with cooking/housework;Direct supervision/assist for medications management   Equipment Recommendations  None recommended by PT    Recommendations for Other Services Rehab consult     Precautions / Restrictions Precautions Precautions: Fall Precaution Comments: , bariatric, profound weakness of legs, ? due to abcesses in liiopsoas L > R, recent gr. toe amp on  Left Required Braces or Orthoses: Splint/Cast Splint/Cast: post op shoe for left, crocs Restrictions Weight Bearing Restrictions: Yes LLE Weight Bearing: Weight bearing as tolerated Other Position/Activity Restrictions: in Darco shoe (has in room)     Mobility  Bed Mobility   Bed Mobility: Rolling Rolling: Max assist, +2 for safety/equipment, +2 for physical assistance   Supine to sit: HOB elevated, +2 for safety/equipment, +2 for physical assistance, Total assist Sit to supine: +2 for physical assistance, +2 for safety/equipment, Total assist   General bed mobility comments: assist to bend knees for rolling Rt/Lt to adjust/change bed pad at EOS. max to move legs  to bed edge, cues to reach for rails, rest breaks at intervals. max assist to sit fully upright., total for legs and trunk back to sidelying.    Transfers                   General transfer comment: will need mechanical lift    Ambulation/Gait                   Stairs             Wheelchair Mobility    Modified Rankin (Stroke Patients Only)  Balance Overall balance assessment: Needs assistance Sitting-balance support: Bilateral upper extremity supported, Feet supported Sitting balance-[name] Scale: Poor Sitting balance - Comments: intermittently ablwe to balance seated on bed edge, limited by back pain and groin pian Postural control: Posterior lean                                   Cognition Arousal/Alertness: Awake/alert Behavior During Therapy: WFL for tasks assessed/performed Overall Cognitive Status: Within Functional Limits for tasks assessed                                          Exercises      General Comments        Pertinent Vitals/Pain Pain Assessment Pain Score: 10-Worst pain ever Pain Location: L back and R hip - with transitions Pain Descriptors / Indicators: Grimacing, Guarding, Discomfort Pain Intervention(s): Monitored during session, Premedicated before session    Home Living                          Prior Function            PT Goals (current goals can now be found in the care plan section) Progress towards PT goals: Progressing toward goals    Frequency    Min 3X/week      PT Plan Current plan remains appropriate    Co-evaluation PT/OT/SLP Co-Evaluation/Treatment: Yes Reason for Co-Treatment: Complexity of the patient's impairments (multi-system involvement) PT goals addressed during session: Mobility/safety with mobility OT goals addressed during session: ADL's and self-care      AM-PAC PT "6 Clicks" Mobility   Outcome Measure  Help needed turning from your back to your side while in a flat bed without using bedrails?: Total Help needed moving from lying on your back to sitting on the side of a flat bed without using bedrails?: Total Help needed moving to and from a bed to a chair (including a wheelchair)?: Total Help needed standing up from a chair using your arms (e.g., wheelchair or bedside chair)?: Total Help needed to walk in hospital room?: Total Help needed climbing 3-5 steps with a railing? : Total 6 Click Score: 6    End of Session   Activity Tolerance: Patient tolerated treatment well Patient left: in bed;with call bell/phone within reach Nurse Communication: Mobility status;Need for lift equipment PT Visit Diagnosis: Unsteadiness on feet  (R26.81);Difficulty in walking, not elsewhere classified (R26.2);Pain Pain - Right/Left: Left     Time: 3244-0102 PT Time Calculation (min) (ACUTE ONLY): 42 min  Charges:  $Therapeutic Activity: 23-37 mins                     Tresa Endo PT Acute Rehabilitation Services Office [phone] Weekend [phone]   Claretha Cooper 08/27/2022, 1:47 PM

## 2022-08-27 NOTE — Progress Notes (Signed)
  Echocardiogram Echocardiogram Transesophageal has been performed.  Bobbye Charleston 08/27/2022, 2:40 PM

## 2022-08-27 NOTE — Progress Notes (Signed)
Occupational Therapy Treatment Patient Details Name: William [name] MRN: [id number] DOB: 1961-06-07 Today's Date: 08/27/2022   History of present illness Patient is 62 yo male admitted 08/16/22  for evaluation of persistent left-sided low back pain and pain along the suprapubic region , treated for dysuria recently, S/P PROSTATE ABSCESS UNROOFING, TRANSURETHRAL RESECTION OF THE PROSTATE. PMH: right great toe amputation,DM,HTN.previous history:On 08/12/2022 the patient was admitted to the outside hospital with DKA a and A-fib with RVR.  He was complaining of weakness shortness of breath.  He was noted to be hypoxic and his blood sugar was over 500.  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  Further evaluation with CT scan subsequently demonstrated possible prostatic abscess and psoas abscess on the left.   OT comments  Patient was able to engage in sitting EOB with increased pain noted in hips with +2 for safety. Patient is eager and motivated to participate in therapy and get back to PLOF.  Patient's discharge plan remains appropriate at this time. OT will continue to follow acutely.     Recommendations for follow up therapy are one component of a multi-disciplinary discharge planning process, led by the attending physician.  Recommendations may be updated based on patient status, additional functional criteria and insurance authorization.    Follow Up Recommendations  Acute inpatient rehab (3hours/day)     Assistance Recommended at Discharge Frequent or constant Supervision/Assistance  Patient can return home with the following  Two people to help with walking and/or transfers;Two people to help with bathing/dressing/bathroom;Assistance with cooking/housework;Help with stairs or ramp for entrance;Assist for transportation   Equipment Recommendations  None recommended by OT    Recommendations for Other Services       Precautions / Restrictions Precautions Precautions: Fall Precaution Comments: , bariatric, profound weakness of legs, ? due to abcesses in liiopsoas L > R, recent gr. toe amp on  Left Required Braces or Orthoses: Splint/Cast Splint/Cast: post op shoe for left, crocs Restrictions Weight Bearing Restrictions: Yes LLE Weight Bearing: Weight bearing as tolerated Other Position/Activity Restrictions: in Darco shoe (has in room)       Mobility Bed Mobility Overal bed mobility: Needs Assistance Bed Mobility: Rolling Rolling: Max assist   Supine to sit: HOB elevated, +2 for safety/equipment, +2 for physical assistance, Total assist Sit to supine: +2 for physical assistance, +2 for safety/equipment, Total assist   General bed mobility comments: assist to bed knees for rolling Rt/Lt to adjust/change bed pad at EOS.         Balance Overall balance assessment: Needs assistance Sitting-balance support: Bilateral upper extremity supported, Feet supported Sitting balance-[name] Scale: Poor   Postural control: Posterior lean           ADL either performed or assessed with clinical judgement   ADL Overall ADL's : Needs assistance/impaired           General ADL Comments: Patient was agreeable to sit on EOB today. patient was +2 to progress to EOB with minimal movement from patient to advance BLE to EOB. patient was able to tolerate sitting for about 5 mins with increased pain reported in bilateral hips with posterior leaning noted. patient was +2 for repositioning in bed with TD for hygiene tasks with patient noted to have voided BM during session.      Cognition Arousal/Alertness: Awake/alert Behavior During Therapy: WFL for tasks assessed/performed Overall Cognitive Status: Within Functional Limits for tasks assessed  Pertinent Vitals/ Pain       Pain Assessment Pain Assessment: Faces Faces Pain Scale: Hurts whole lot Pain Location: L back  and R hip - with transitions Pain Descriptors / Indicators: Grimacing, Guarding, Discomfort Pain Intervention(s): Limited activity within patient's tolerance, Premedicated before session, Monitored during session, Repositioned         Frequency  Min 2X/week        Progress Toward Goals  OT Goals(current goals can now be found in the care plan section)  Progress towards OT goals: Progressing toward goals     Plan Discharge plan remains appropriate    Co-evaluation    PT/OT/SLP Co-Evaluation/Treatment: Yes Reason for Co-Treatment: To address functional/ADL transfers;For patient/therapist safety PT goals addressed during session: Mobility/safety with mobility OT goals addressed during session: ADL's and self-care      AM-PAC OT "6 Clicks" Daily Activity     Outcome Measure   Help from another person eating meals?: None Help from another person taking care of personal grooming?: A Little Help from another person toileting, which includes using toliet, bedpan, or urinal?: A Lot Help from another person bathing (including washing, rinsing, drying)?: A Lot Help from another person to put on and taking off regular upper body clothing?: A Little Help from another person to put on and taking off regular lower body clothing?: Total 6 Click Score: 15    End of Session    OT Visit Diagnosis: Other abnormalities of gait and mobility (R26.89);Muscle weakness (generalized) (M62.81);Pain Pain - Right/Left: Left Pain - part of body: Ankle and joints of foot   Activity Tolerance Patient limited by pain   Patient Left in bed;with call bell/phone within reach   Nurse Communication Mobility status        Time: 0830-0905 OT Time Calculation (min): 35 min  Charges: OT General Charges $OT Visit: 1 Visit OT Treatments $Therapeutic Activity: 8-22 mins  Rennie Plowman, MS Acute Rehabilitation Department Office# [phone]   Willa Rough 08/27/2022, 11:08 AM

## 2022-08-27 NOTE — Anesthesia Postprocedure Evaluation (Signed)
Anesthesia Post Note  Patient: William [name]  Procedure(s) Performed: TRANSESOPHAGEAL ECHOCARDIOGRAM (TEE)     Patient location during evaluation: PACU Anesthesia Type: MAC Level of consciousness: awake and alert Pain management: pain level controlled Vital Signs Assessment: post-procedure vital signs reviewed and stable Respiratory status: spontaneous breathing, nonlabored ventilation and respiratory function stable Cardiovascular status: blood pressure returned to baseline and stable Postop Assessment: no apparent nausea or vomiting Anesthetic complications: no   No notable events documented.  Last Vitals:  Vitals:   08/27/22 1520 08/27/22 1540  BP: (!) 142/74 (!) 139/95  Pulse: 87 92  Resp: 18 18  Temp:    SpO2: 94% 96%    Last Pain:  Vitals:   08/27/22 1440  TempSrc: Temporal  PainSc: [place]

## 2022-08-27 NOTE — Anesthesia Procedure Notes (Signed)
Procedure Name: MAC Date/Time: 08/27/2022 2:25 PM  Performed by: Darletta Moll, CRNAPre-anesthesia Checklist: Patient identified, Emergency Drugs available, Suction available and Patient being monitored Patient Re-evaluated:Patient Re-evaluated prior to induction Oxygen Delivery Method: Supernova nasal CPAP

## 2022-08-27 NOTE — TOC Progression Note (Addendum)
Transition of Care [facility]) - Progression Note    Patient Details  Name: William [name] MRN: [id number] Date of Birth: 1960-12-27  Transition of Care [facility]) CM/SW Contact  Angelita Ingles, RN Phone Number:[phone]  08/27/2022, 2:01 PM  Clinical Narrative:    Cm at bedside to speak with patient due to progression report that patient wants to seek placement at [facility]. Patient states that he would like inpatient rehab at [facility] if possible but as an alternative he would go to [place] made patient aware that [facility] will follow for disposition needs/planning. CM made patient aware that wife has left message requesting that CM call her. Patient gives verbal consent for CM to speak with wife.  CM attempted to contact wife [name] @ [phone] with no answer. Message has been left with Cm contact number. Will await return call.   1500 Cm received return call from patients wife. Wife has questions about FMLA paperwork. Cm made wife aware that CM does not have access to [facility] paperwork. CM has referred wife to patients job to receive FMLA paperwork. Cm advised wife that job will provide paperwork. Wife verbalized understanding.         Expected Discharge Plan and Services                                               Social Determinants of Health (SDOH) Interventions SDOH Screenings   Food Insecurity: No Food Insecurity (08/17/2022)  Housing: Low Risk  (08/17/2022)  Transportation Needs: No Transportation Needs (08/17/2022)  Utilities: Not At Risk (08/17/2022)  Tobacco Use: Low Risk  (08/19/2022)    Readmission Risk Interventions     No data to display

## 2022-08-27 NOTE — Anesthesia Preprocedure Evaluation (Addendum)
Anesthesia Evaluation  Patient identified by MRN, date of birth, ID band Patient awake    Reviewed: Allergy & Precautions, NPO status , Patient's Chart, lab work & pertinent test results  Airway Mallampati: II  TM Distance: >3 FB Neck ROM: Full    Dental no notable dental hx.    Pulmonary sleep apnea (denies)    Pulmonary exam normal breath sounds clear to auscultation       Cardiovascular hypertension (no home meds), Normal cardiovascular exam+ dysrhythmias Atrial Fibrillation  Rhythm:Regular Rate:Normal  TTE at [facility] on 1/12 showed mildly thickened aortic leaflets.   Neuro/Psych negative neurological ROS  negative psych ROS   GI/Hepatic negative GI ROS, Neg liver ROS,,,  Endo/Other  diabetes, Poorly Controlled, Type 2, Insulin Dependent  Morbid obesityPresented in DKA BMI 46  Renal/GU negative Renal ROS   CT at [facility] showed hypodensities in prostate gland measuring 3.5X 2.2 cm, in the right Hemi gland and 4X 3 cm in the left Hemi gland suspicious for prostatic abscess.  Right psoas abscess measuring 3.2X 2.7 cm likely reflecting developing abscess, small pelvic free fluid    Musculoskeletal negative musculoskeletal ROS (+)    Abdominal  (+) + obese  Peds  Hematology  (+) Blood dyscrasia, anemia Hb 10.2   Anesthesia Other Findings severe sepsis found to have disseminated mssa infection and left great toe OM, complicated by dka- s/p psoas drain by IR and toe amp  Reproductive/Obstetrics negative OB ROS                             Anesthesia Physical Anesthesia Plan  ASA: 4  Anesthesia Plan: MAC   Post-op Pain Management:    Induction:   PONV Risk Score and Plan: 2 and Propofol infusion and TIVA  Airway Management Planned: Natural Airway and Simple Face Mask  Additional Equipment: None  Intra-op Plan:   Post-operative Plan:   Informed Consent: I have reviewed the patients  History and Physical, chart, labs and discussed the procedure including the risks, benefits and alternatives for the proposed anesthesia with the patient or authorized representative who has indicated his/her understanding and acceptance.       Plan Discussed with: CRNA  Anesthesia Plan Comments:         Anesthesia Quick Evaluation

## 2022-08-27 NOTE — Interval H&P Note (Signed)
History and Physical Interval Note:  08/27/2022 1:24 PM  William [name]  has presented today for surgery, with the diagnosis of BACTOREMIA.  The various methods of treatment have been discussed with the patient and family. After consideration of risks, benefits and other options for treatment, the patient has consented to  Procedure(s): TRANSESOPHAGEAL ECHOCARDIOGRAM (TEE) (N/A) as a surgical intervention.  The patient's history has been reviewed, patient examined, no change in status, stable for surgery.  I have reviewed the patient's chart and labs.  Questions were answered to the patient's satisfaction.     Inocente Krach

## 2022-08-27 NOTE — Progress Notes (Signed)
William [name]  PROGRESS NOTE  Dat [name] OZH:[id number] DOB: 15-Dec-1960 DOA: 08/16/2022 PCP: Pcp, No   Brief HPI:     62 y.o. M with DM, HTN, MO, as well as recent osteomyelitis of the left great toe status post amputation, who was transferred from outside hospital for prostatic abscess.2 weeks prior to admission patient diagnosed with prostatitis, treated with Cipro for MSSA. 1 week ago, patient presented to outside hospital with atrial fibrillation, sepsis symptomology, found to have MSSA bacteremia, iliopsoas and prostatic abscesses.   Patient was transferred to [facility], urology and IR was consulted.  Patient was taken to the OR for prostatic abscess treatment.  ID was consulted.  Subjective   Patient seen and examined, no new complaints.  Awaiting TEE today.   Assessment/Plan:   MSSA bacteremia -Patient had positive blood culture at [facility] ER for MSSA in 2 out of 2 bottles -Blood culture on 1/13 drawn here was negative to date -Source likely due to ulcer with dissemination to prostate/psoas/vertebral -TTE was negative for vegetation -Given psoas involvement, so presumed to have lumbar osteomyelitis with spillover into psoas -ID anticipates 6 weeks of treatment with IV antibiotics; plan for TEE today.  Bilateral psoas fluid collection -CT abdomen/pelvis obtained today shows bilateral iliopsoas fluid collection, right-sided collection slightly larger as compared to left -IR consulted for CT-guided aspiration and possible drainage of fluid collection -Underwent drainage and drain placement of left psoas abscess, attempted right psoas abscess - drainage but could not be done due to poor percutaneous window. -Will go up on the dose of oxycodone to 10 mg p.o. every 4 hours as needed's -Will need repeat CT after few days of antibiotics to check for resolution of this abscess  Prostatic abscess S/p TURP, prostate abscess unroofing 1/14 by Dr. [name] -Foley catheter  was just continued per urology recommendation on 06/22/2023 - Foley catheter was discontinued as per urology recommendation   Osteomyelitis of left foot  S/p LEFT great toe partial amputation on 1/11 by Dr. [name] up at [facility] Discussed with Dr. [name], surgeon 1/15 by phone Changed post-op dressing on 1/15, toe looks good, no drainage, dehiscence   Dr. [name] recommended: - Change dressing daily -- xeroform, wrapped in gauze, covered in ACE wrap - WBAT in post op shoe - After two weeks (so around Jan 25) stop Xeroform in dressing and use adaptic - Leave sutures in place for 1 month (remove Feb 11) - Follow up with Dr. [name] in [place] within 1 month, preferably within a week of discharge to home or SNF  Hyperthyroidism Abnormal thyroid testing at OSH and briefly on PTU there.   Repeat TSH here normal -T3- 65, free T4- 1.67 -Follow-up endocrinology as outpatient   Hyponatremia Mild   Hypokalemia - Replete   Type 2 diabetes mellitus (HCC) Glucoses controlled, A1c >12% - Continue Ss corrections -CBG has been elevated; increased Semglee to 18 units subcu daily -Also started NovoLog 3 units 3 times daily meal coverage  Severe obstructive sleep apnea - CPAP at night   Paroxysmal atrial fibrillation (HCC) New diagnosis at last admission.  CHA2DS2-Vasc 1.   - Defer anticoagulation for now given low CHADS score - Monitor on tele     Normocytic anemia Hgb 10 in setting of recent orthopedic surgery   Morbid obesity (HCC) BMI 47, unclear if he has malnutrition, consult dietitian   Hypercholesteremia Not on statin     Medications     [MAR Hold] Chlorhexidine Gluconate Cloth  6 each  Topical Daily   [MAR Hold] insulin aspart  0-15 Units Subcutaneous TID WC   [MAR Hold] insulin aspart  3 Units Subcutaneous TID WC   [MAR Hold] insulin glargine-yfgn  18 Units Subcutaneous QHS   [MAR Hold] multivitamin with minerals  1 tablet Oral Daily   [MAR Hold] Ensure Max  Protein  11 oz Oral BID   [MAR Hold] senna-docusate  2 tablet Oral BID   [MAR Hold] sodium chloride flush  10-40 mL Intracatheter Q12H   [MAR Hold] sodium chloride flush  5 mL Intracatheter Q8H   [MAR Hold] tamsulosin  0.4 mg Oral Daily     Data Reviewed:   CBG:  Recent Labs  Lab 08/26/22 1624 08/26/22 2207 08/27/22 0742 08/27/22 1152 08/27/22 1414  GLUCAP 161* 219* 197* 169* 158*    SpO2: 93 % O2 Flow Rate (L/min): 2 L/min    Vitals:   08/26/22 1459 08/26/22 2054 08/27/22 0409 08/27/22 1405  BP: 133/71 123/71 (!) 144/63 134/84  Pulse: 67 97 (!) 56 97  Resp: 20 17  13   Temp: 98 F (36.7 C) 99.2 F (37.3 C) 98.2 F (36.8 C)   TempSrc: Oral Oral Oral   SpO2: 95% 96% 92% 93%  Weight:      Height:          Data Reviewed:  Basic Metabolic Panel: Recent Labs  Lab 08/21/22 1043 08/23/22 0556 08/25/22 0539 08/26/22 0500  NA 132* 131* 134* 131*  K 3.5 3.5 3.4* 4.0  CL 94* 95* 97* 98  CO2 30 28 29 28   GLUCOSE 248* 340* 175* 206*  BUN 11 17 15 18   CREATININE 0.72 0.78 0.75 0.70  CALCIUM 7.6* 7.6* 7.8* 8.0*    CBC: Recent Labs  Lab 08/21/22 1043 08/23/22 0556 08/24/22 0628 08/26/22 0500  WBC 14.3* 12.2* 12.7* 11.2*  HGB 10.6* 10.8* 10.4* 10.2*  HCT 33.1* 34.3* 32.1* 32.1*  MCV 82.3 82.3 83.8 83.8  PLT 312 330 317 277    LFT Recent Labs  Lab 08/23/22 0556 08/26/22 0500  AST 31 25  ALT 24 24  ALKPHOS 281* 234*  BILITOT 0.5 0.5  PROT 6.3* 6.6  ALBUMIN 2.1* 2.4*     Antibiotics: Anti-infectives (From admission, onward)    Start     Dose/Rate Route Frequency Ordered Stop   08/18/22 2200  [MAR Hold]  ceFAZolin (ANCEF) IVPB 2g/100 mL premix        (MAR Hold since Tue 08/27/2022 at 1402.Hold Reason: Transfer to a Procedural area)   2 g 200 mL/hr over 30 Minutes Intravenous Every 8 hours 08/18/22 1712     08/18/22 0200  cefTRIAXone (ROCEPHIN) 2 g in sodium chloride 0.9 % 100 mL IVPB  Status:  Discontinued        2 g 200 mL/hr over 30 Minutes  Intravenous Every 24 hours 08/17/22 1304 08/18/22 1712   08/17/22 1400  vancomycin (VANCOREADY) IVPB 1750 mg/350 mL  Status:  Discontinued        1,750 mg 175 mL/hr over 120 Minutes Intravenous Every 12 hours 08/17/22 0049 08/18/22 1712   08/17/22 0130  cefTRIAXone (ROCEPHIN) 1 g in sodium chloride 0.9 % 100 mL IVPB  Status:  Discontinued        1 g 200 mL/hr over 30 Minutes Intravenous Every 24 hours 08/17/22 0039 08/17/22 1304   08/17/22 0130  vancomycin (VANCOREADY) IVPB 2000 mg/400 mL        2,000 mg 200 mL/hr over 120 Minutes Intravenous  Once  08/17/22 0042 08/17/22 0418        DVT prophylaxis: SCDs  Code Status: Full code  Family Communication: No family at bedside   CONSULTS    Objective    Physical Examination:  Appears in no acute distress S1-S2, regular, no murmur auscultated Clear to auscultation bilaterally Abdomen is soft, nontender, no organomegaly, left flank drain in place    Status is: Inpatient:    Pressure Injury 08/25/22 Buttocks Medial;Mid Stage 2 -  Partial thickness loss of dermis presenting as a shallow open injury with a red, pink wound bed without slough. Circular, pink (Active)  08/25/22 1400  Location: Buttocks  Location Orientation: Medial;Mid  Staging: Stage 2 -  Partial thickness loss of dermis presenting as a shallow open injury with a red, pink wound bed without slough.  Wound Description (Comments): Circular, pink  Present on Admission:      Pressure Injury 08/25/22 Ischial tuberosity Right Stage 2 -  Partial thickness loss of dermis presenting as a shallow open injury with a red, pink wound bed without slough. Circular, pink (Active)  08/25/22 1400  Location: Ischial tuberosity  Location Orientation: Right  Staging: Stage 2 -  Partial thickness loss of dermis presenting as a shallow open injury with a red, pink wound bed without slough.  Wound Description (Comments): Circular, pink  Present on Admission:      Pressure Injury  08/25/22 Buttocks Right Stage 2 -  Partial thickness loss of dermis presenting as a shallow open injury with a red, pink wound bed without slough. Circula, pink (Active)  08/25/22 1400  Location: Buttocks  Location Orientation: Right  Staging: Stage 2 -  Partial thickness loss of dermis presenting as a shallow open injury with a red, pink wound bed without slough.  Wound Description (Comments): Circula, pink  Present on Admission:     [place]   [facility] If 7PM-7AM, please contact night-coverage at [email or web address], Office  [phone]   08/27/2022, 2:41 PM  LOS: 11 days

## 2022-08-27 NOTE — Inpatient Diabetes Management (Signed)
Inpatient Diabetes Program Recommendations  AACE/ADA: New Consensus Statement on Inpatient Glycemic Control (2015)  Target Ranges:  Prepandial:   less than 140 mg/dL      Peak postprandial:   less than 180 mg/dL (1-2 hours)      Critically ill patients:  140 - 180 mg/dL   Lab Results  Component Value Date   GLUCAP 197 (H) 08/27/2022   HGBA1C 12.9 (H) 08/17/2022    Review of Glycemic Control  Latest Reference Range & Units 08/26/22 11:27 08/26/22 16:24 08/26/22 22:07 08/27/22 07:42  Glucose-Capillary 70 - 99 mg/dL 173 (H) 161 (H) 219 (H) 197 (H)  (H): Data is abnormally high Admit with:  MSSA bacteremia Bilateral psoas fluid collection Prostatic abscess Osteomyelitis of left foot  S/p LEFT great toe partial amputation on 1/11 @ [facility]   History: DM2   Home DM Meds: None   Current Orders: Semglee 18 units QHS    Novolog 0-15 TID with meals     Novolog 3 units TID    Consider increasing Semglee to 22 units QHS.  Thanks, Bronson Curb, MSN, RNC-OB Diabetes Coordinator [phone] (8a-5p)

## 2022-08-27 NOTE — Op Note (Signed)
INDICATIONS: bacteremia  PROCEDURE:   Informed consent was obtained prior to the procedure. The risks, benefits and alternatives for the procedure were discussed and the patient comprehended these risks.  Risks include, but are not limited to, cough, sore throat, vomiting, nausea, somnolence, esophageal and stomach trauma or perforation, bleeding, low blood pressure, aspiration, pneumonia, infection, trauma to the teeth and death.    After a procedural time-out, the oropharynx was anesthetized with 20% benzocaine spray.   During this procedure the patient was administered IV propofol, Anesthesiology, Dr. Doroteo Glassman.  The transesophageal probe was inserted in the esophagus and stomach without difficulty and multiple views were obtained.  The patient was kept under observation until the patient left the procedure room.  The patient left the procedure room in stable condition.   Agitated microbubble saline contrast was not administered.  COMPLICATIONS:    There were no immediate complications.  FINDINGS:  LVH is present, otherwise normal TEE.  RECOMMENDATIONS:    No echo evidence of endocarditis.  Time Spent Directly with the Patient:  30 minutes   William [name] 08/27/2022, 2:32 PM

## 2022-08-28 ENCOUNTER — Inpatient Hospital Stay (HOSPITAL_COMMUNITY): Payer: Commercial Managed Care - PPO

## 2022-08-28 DIAGNOSIS — B9561 Methicillin susceptible Staphylococcus aureus infection as the cause of diseases classified elsewhere: Secondary | ICD-10-CM | POA: Diagnosis not present

## 2022-08-28 DIAGNOSIS — R7881 Bacteremia: Secondary | ICD-10-CM | POA: Diagnosis not present

## 2022-08-28 LAB — GLUCOSE, CAPILLARY
Glucose-Capillary: 185 mg/dL — ABNORMAL HIGH (ref 70–99)
Glucose-Capillary: 152 mg/dL — ABNORMAL HIGH (ref 70–99)
Glucose-Capillary: 148 mg/dL — ABNORMAL HIGH (ref 70–99)
Glucose-Capillary: 157 mg/dL — ABNORMAL HIGH (ref 70–99)

## 2022-08-28 LAB — CREATININE, FLUID (PLEURAL, PERITONEAL, JP DRAINAGE): Creat, Fluid: 0.5 mg/dL

## 2022-08-28 MED ORDER — SODIUM CHLORIDE (PF) 0.9 % IJ SOLN
INTRAMUSCULAR | Status: AC
  Filled 2022-08-28: qty 50

## 2022-08-28 MED ORDER — OXYCODONE HCL 5 MG PO TABS
5.0000 mg | ORAL_TABLET | Freq: Once | ORAL | Status: AC
  Administered 2022-08-28: 5 mg via ORAL
  Filled 2022-08-28: qty 1

## 2022-08-28 MED ORDER — POLYETHYLENE GLYCOL 3350 17 G PO PACK
17.0000 g | PACK | Freq: Every day | ORAL | Status: DC
  Administered 2022-08-28 – 2022-09-04 (×4): 17 g via ORAL
  Filled 2022-08-28 (×7): qty 1

## 2022-08-28 MED ORDER — IOHEXOL 300 MG/ML  SOLN
100.0000 mL | Freq: Once | INTRAMUSCULAR | Status: AC | PRN
  Administered 2022-08-28: 100 mL via INTRAVENOUS

## 2022-08-28 NOTE — Progress Notes (Signed)
Inpatient Rehab Admissions Coordinator:   Pt continues to require significant assist to transition to EOB, not able to mobilize past EOB.  He is not a candidate for CIR at this time.  We can continue to follow for acute progress, but recommend TOC investigate alternate venues for rehab.   Shann Medal, PT, DPT Admissions Coordinator [phone] 08/28/22  3:07 PM

## 2022-08-28 NOTE — Progress Notes (Signed)
Physical Therapy Treatment Patient Details Name: William [name] MRN: [id number] DOB: 1961/04/04 Today's Date: 08/28/2022   History of Present Illness Patient is 62 yo male admitted 08/16/22  for evaluation of persistent left-sided low back pain and pain along the suprapubic region , treated for dysuria recently, S/P PROSTATE ABSCESS UNROOFING, TRANSURETHRAL RESECTION OF THE PROSTATE. PMH: right great toe amputation,DM,HTN.previous history:On 08/12/2022 the patient was admitted to the outside hospital with DKA a and A-fib with RVR.  He was complaining of weakness shortness of breath.  He was noted to be hypoxic and his blood sugar was over 500.  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  Further evaluation with CT scan subsequently demonstrated possible prostatic abscess and psoas abscess on the left.    PT Comments    Assisted patient in tilting on the Kreg bed.  25* x 2 minutes, 30* 4 minutes, 45* 4 minutes.  Patient noted with  some diaphoresis  at end. Will monitor BP next tilting. Patient needs to move to a room with a maxi sky  to safely transfer  patient out of bed, have  made contact with  supervisor  to try to find a room to accommodate patient.  Recommendations for follow up therapy are one component of a multi-disciplinary discharge planning process, led by the attending physician.  Recommendations may be updated based on patient status, additional functional criteria and insurance authorization.  Follow Up Recommendations  Skilled nursing-short term rehab (<3 hours/day)     Assistance Recommended at Discharge Frequent or constant Supervision/Assistance  Patient can return home with the following Two people to help with walking and/or transfers;A lot of help with bathing/dressing/bathroom;Assist for transportation;Help with stairs or ramp for entrance;Assistance with cooking/housework;Direct supervision/assist  for medications management   Equipment Recommendations  None recommended by PT    Recommendations for Other Services       Precautions / Restrictions Precautions Precautions: Fall Precaution Comments: , bariatric, profound weakness of legs, ? due to abcesses in liiopsoas L > R, recent gr. toe amp on  Left Required Braces or Orthoses: Splint/Cast Splint/Cast: post op shoe for left, crocs     Mobility  Bed Mobility   Bed Mobility: Rolling Rolling: Max assist, +2 for safety/equipment, +2 for physical assistance         General bed mobility comments: assist to bend knees for rolling Rt/Lt to adjust/change bed pad at EOS.    Transfers                        Ambulation/Gait                   Stairs             Wheelchair Mobility    Modified Rankin (Stroke Patients Only)       Balance                                            Cognition Arousal/Alertness: Awake/alert Behavior During Therapy: WFL for tasks assessed/performed                                            Exercises  General Comments        Pertinent Vitals/Pain Pain Assessment Faces Pain Scale: Hurts little more Pain Location: L back and R hip - with transitions Pain Descriptors / Indicators: Grimacing, Guarding, Discomfort Pain Intervention(s): Patient requesting pain meds-RN notified    Home Living                          Prior Function            PT Goals (current goals can now be found in the care plan section) Progress towards PT goals: Progressing toward goals    Frequency           PT Plan Discharge plan needs to be updated    Co-evaluation              AM-PAC PT "6 Clicks" Mobility   Outcome Measure  Help needed turning from your back to your side while in a flat bed without using bedrails?: A Lot Help needed moving from lying on your back to sitting on the side of a flat bed  without using bedrails?: Total Help needed moving to and from a bed to a chair (including a wheelchair)?: Total Help needed standing up from a chair using your arms (e.g., wheelchair or bedside chair)?: Total Help needed to walk in hospital room?: Total Help needed climbing 3-5 steps with a railing? : Total 6 Click Score: 7    End of Session Equipment Utilized During Treatment:  (Tilt bed) Activity Tolerance: Patient tolerated treatment well Patient left: in bed;with call bell/phone within reach   PT Visit Diagnosis: Unsteadiness on feet (R26.81);Difficulty in walking, not elsewhere classified (R26.2);Pain     Time: 1530-1615 PT Time Calculation (min) (ACUTE ONLY): 45 min  Charges:  $Therapeutic Activity: 38-52 mins                     [place] Office [phone] Weekend [phone]    Claretha Cooper 08/28/2022, 5:32 PM

## 2022-08-28 NOTE — Progress Notes (Signed)
Nutrition Follow-up  DOCUMENTATION CODES:   Non-severe (moderate) malnutrition in context of acute illness/injury, Obesity unspecified  INTERVENTION:   -Ensure MAX Protein po BID, each supplement provides 150 kcal and 30 grams of protein    -Multivitamin with minerals daily   -Placed "Carbohydrate Counting" and " Plate Method" handout in AVS. Reviewed diet principles with patient 1/17.  NUTRITION DIAGNOSIS:   Moderate Malnutrition related to acute illness (osteomyelitis) as evidenced by mild fat depletion, mild muscle depletion, energy intake < 75% for > 7 days.  Ongoing.  GOAL:   Patient will meet greater than or equal to 90% of their needs  Progressing.  MONITOR:   PO intake, Supplement acceptance, Labs, Weight trends, I & O's  ASSESSMENT:   Pt with DM, HTN, MO, hyperthyroidism as well as recent osteomyelitis of the left great toe status post amputation, who was transferred from outside hospital for prostatic abscess.  1/12: admitted 1/18: CT guided drain placement of left psoas abscess  1/23: s/p TEE  Patient was NPO 1/23 so no PO until later that evening. On 1/22 was consuming 100% of meals and accepting protein supplements.  Admission weight: 392 lbs Current weight: 380 lbs Pt with moderate LLE and severe RLE edema.  Medications: Multivitamin with minerals daily, Senokot  Labs reviewed: CBGs: 140-185   Diet Order:   Diet Order             Diet Carb Modified Fluid consistency: Thin; Room service appropriate? Yes  Diet effective now                   EDUCATION NEEDS:   Education needs have been addressed  Skin:  Skin Assessment: Skin Integrity Issues: Skin Integrity Issues:: Stage II Stage II: right buttocks, right IT, mid buttocks  Last BM:  1/23  Height:   Ht Readings from Last 1 Encounters:  08/24/22 6\' 4"  (1.93 m)    Weight:   Wt Readings from Last 1 Encounters:  08/26/22 (!) 172.5 kg    BMI:  Body mass index is 46.29  kg/m.  Estimated Nutritional Needs:   Kcal:  2300-2500  Protein:  140-155g  Fluid:  2.5L/day   Clayton Bibles, MS, RD, LDN Inpatient Clinical Dietitian Contact information available via Amion

## 2022-08-28 NOTE — Progress Notes (Signed)
[facility]  Date of Admission:  08/16/2022     Principal Problem:   MSSA bacteremia Active Problems:   Hypercholesteremia   Morbid obesity ([place])   Prostatic abscess   Normocytic anemia   Paroxysmal atrial fibrillation (HCC)   Osteomyelitis of left foot (HCC)   Severe obstructive sleep apnea   Type 2 diabetes mellitus (HCC)   Possible psoas muscle infection   Hypokalemia   Hyperthyroidism   Hyponatremia   Malnutrition of moderate degree          Assessment: 71 YM with afib, hypothyroidism, DM2, admitted 1/08 to [facility] with severe sepsis found to have disseminated mssa infection and left great toe OM, complicated by dka, transferred [facility] 1[facility]12 for further management of prostatic abscess.    # Disseminated MSSA bacteremia with  b/l psoas abscess, prostatic abscess and lungs - Blood cultures at [facility] ER on 1/8 grew 2/2 MSSA, blood cultures on 1/13 at our facility with no growth.   -CT at [facility] ER showed hypodensities in prostate gland measuring 3.5X 2.2 cm, in the right Hemi gland and 4X 3 cm in the left Hemi gland suspicious for prostatic abscess.  Right psoas abscess measuring 3.2X 2.7 cm likely reflecting developing abscess, small pelvic free fluid.  Right middle lobe groundglass opacity, partially imaged. - TTE at [facility] on 1/12 showed mildly thickened aortic leaflets. -Urology following, SP TURP/ptostate abscess unroofing on 1/14, foley placed -IR re-engaged as repeat CT on 1/17 showed b/l iliopsoas fluid collection, right side larger. On 1/18 pt underwent drain placment in left psoad abscess -removed 59ml bloody purulent fluid(Cx MSSA). Noted that left psoas abscess has second collection, and right posad collection not aspirated(no window avaialbe. Recc Follow-up CT.  -IJ out on 1/15 -TEE showed no vegetation -CT on 1/24 showed decrease in size of left psoas collection with PERC drain cath in place, right-sided psoas abscess not  significant change since 1/17.  Continued improvement of prostate gland with small areas of ill-defined low attenuation in the prostate apex. Recommendations: - PICC  placed on 1/19,repeat blood Cx on 1/18 NG(blood cultures from 1/13 were aerobic bottles only as such cultures were repeated) - Continue cefazolin to complete 6 weeks of antibiotics from repeat Cx on 1/18 EOT 2/28 for bacteremia. After IV therapy is complete plan on tensioning to cefadroxil 500 bid.  While waiting  for repeat imaging at about 6 weeks to assess how long abx needed for psoas abscess. 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 Will target coverage for MSSA as above    ID will sign off  OPAT ORDERS:  Diagnosis: MSSA bacteremia with psoas abscess  Culture Result: blood Cx + MSSA, toe amp Cx+ MSSA, psoas asp Cx+ MSSA  Allergies  Allergen Reactions   Diltiazem Hcl Other (See Comments)    Causes accelerated heart rate     Discharge antibiotics to be given via PICC line:  Per pharmacy protocol cefazolin 2gm q8h    Duration: 6weeks End Date: 2/23  [facility] Care Per Protocol with [employer] Use: Home health RN for IV administration and teaching, line care and labs.    Labs weekly while on IV antibiotics: _x_ CBC with differential _x_ CMP _x_ CRP _x_ ESR   _x_ Please leave PIC in place until  doctor has seen patient or been notified  Fax weekly labs to [phone]  Clinic Follow Up Appt: 2/6  @ [facility] with Dr. Thedore Mins   Microbiology:   Antibiotics: Cefazolin 1/14- Ceftriaxone 1/12- Vanc 1/12- Cultures: Blood 1/13 ng Urine 1/14 MSSA   SUBJECTIVE: Pt is resting in bed. Wife is on the phone.  Interval: Afebrile overnight.   Review of Systems: Review of Systems  All other systems reviewed and are negative.    Scheduled Meds:   Chlorhexidine Gluconate Cloth  6 each Topical Daily   insulin aspart  0-15 Units Subcutaneous TID WC   insulin aspart  3 Units Subcutaneous TID WC   insulin glargine-yfgn  18 Units Subcutaneous QHS   multivitamin with minerals  1 tablet Oral Daily   polyethylene glycol  17 g Oral Daily   Ensure Max Protein  11 oz Oral BID   senna-docusate  2 tablet Oral BID   sodium chloride (PF)       sodium chloride flush  10-40 mL Intracatheter Q12H   sodium chloride flush  5 mL Intracatheter Q8H   tamsulosin  0.4 mg Oral Daily   Continuous Infusions:   ceFAZolin (ANCEF) IV 2 g (08/28/22 1405)   sodium chloride irrigation     PRN Meds:.acetaminophen **OR** acetaminophen, bisacodyl, fentaNYL (SUBLIMAZE) injection, naLOXone (NARCAN)  injection, oxyCODONE, sodium chloride (PF), sodium chloride flush Allergies  Allergen Reactions   Diltiazem Hcl Other (See Comments)    Causes accelerated heart rate    OBJECTIVE: Vitals:   08/27/22 1540 08/27/22 2100 08/28/22 0540 08/28/22 1312  BP: (!) 139/95 133/82 (!) 122/97 (!) 185/160  Pulse: 92 99 100 (!) 45  Resp: 18  18 18   Temp:  98.3 F (36.8 C) 98.2 F (36.8 C) 98.8 F (37.1 C)  TempSrc:  Oral Oral Oral  SpO2: 96% 95% 92% 100%  Weight:      Height:       Body mass index is 46.29 kg/m.  Physical Exam Constitutional:      General: He is not in acute distress.    Appearance: He is normal weight. He is not toxic-appearing.  HENT:     Head: Normocephalic and atraumatic.     Right Ear: External ear normal.     Left Ear: External ear normal.     Nose: No congestion or rhinorrhea.     Mouth/Throat:     Mouth: Mucous membranes are moist.     Pharynx: Oropharynx is clear.  Eyes:     Extraocular Movements: Extraocular movements intact.     Conjunctiva/sclera: Conjunctivae normal.     Pupils: Pupils are equal, round, and reactive to light.  Cardiovascular:     Rate and Rhythm: Normal rate and regular rhythm.     Heart sounds: No murmur  heard.    No friction rub. No gallop.  Pulmonary:     Effort: Pulmonary effort is normal.     Breath sounds: Normal breath sounds.  Abdominal:     General: Abdomen is flat. Bowel sounds are normal.     Palpations: Abdomen is soft.  Musculoskeletal:        General: No swelling. Normal range of motion.     Cervical back: Normal range of motion and neck supple.  Skin:    General: Skin is warm and dry.  Neurological:     General: No focal deficit present.     Mental Status: He is oriented to person, place, and time.  Psychiatric:  Mood and Affect: Mood normal.   Toe bandaged    Lab Results Lab Results  Component Value Date   WBC 11.2 (H) 08/26/2022   HGB 10.2 (L) 08/26/2022   HCT 32.1 (L) 08/26/2022   MCV 83.8 08/26/2022   PLT 277 08/26/2022    Lab Results  Component Value Date   CREATININE 0.70 08/26/2022   BUN 18 08/26/2022   NA 131 (L) 08/26/2022   K 4.0 08/26/2022   CL 98 08/26/2022   CO2 28 08/26/2022    Lab Results  Component Value Date   ALT 24 08/26/2022   AST 25 08/26/2022   ALKPHOS 234 (H) 08/26/2022   BILITOT 0.5 08/26/2022        Laurice Record, [facility] 08/28/2022, 3:58 PM

## 2022-08-28 NOTE — Progress Notes (Signed)
Referring Physician(s): Lama,G  Supervising Physician: Arne Cleveland  Patient Status:  [facility] - In-pt  Chief Complaint: Back pain, prostate/psoas abscesses    Subjective: Pt resting quietly ; no new c/o   Allergies: Diltiazem hcl  Medications: Prior to Admission medications   Medication Sig Start Date End Date Taking? Authorizing Provider  albuterol (ACCUNEB) 1.25 MG/3ML nebulizer solution Take 1 ampule by nebulization every 6 (six) hours as needed for shortness of breath or wheezing. 07/21/22  Yes [provider]  meloxicam (MOBIC) 7.5 MG tablet Take 1 tablet (7.5 mg total) by mouth daily. 08/09/22  Yes Stoneking, Reece Leader., MD  oxyCODONE (OXY IR/ROXICODONE) 5 MG immediate release tablet Take 1 tablet (5 mg total) by mouth every 6 (six) hours as needed for severe pain. 08/09/22  Yes Stoneking, Reece Leader., MD  tamsulosin (FLOMAX) 0.4 MG CAPS capsule Take 1 capsule (0.4 mg total) by mouth daily. 08/09/22  Yes Stoneking, Reece Leader., MD     Vital Signs: BP (!) 122/97 (BP Location: Right Arm)   Pulse 100   Temp 98.2 F (36.8 C) (Oral)   Resp 18   Ht 6\' 4"  (1.93 m)   Wt (!) 380 lb 4.7 oz (172.5 kg)   SpO2 92%   BMI 46.29 kg/m   Physical Exam :awake/alert; LLQ/psoas drain intact, insertion site ok, mildly tender, OP 5-10 cc yellow fluid today; drain flushed this am  Imaging: CT ABDOMEN PELVIS W CONTRAST  Result Date: 08/28/2022 CLINICAL DATA:  Evaluate bilateral psoas muscle abscesses EXAM: CT ABDOMEN AND PELVIS WITH CONTRAST TECHNIQUE: Multidetector CT imaging of the abdomen and pelvis was performed using the standard protocol following bolus administration of intravenous contrast. RADIATION DOSE REDUCTION: This exam was performed according to the departmental dose-optimization program which includes automated exposure control, adjustment of the mA and/or kV according to patient size and/or use of iterative reconstruction technique. CONTRAST:  127mL OMNIPAQUE IOHEXOL  300 MG/ML  SOLN COMPARISON:  CT 08/21/2022, 08/16/2022, 08/06/2022 FINDINGS: Lower chest: No acute abnormality. Hepatobiliary: No focal liver abnormality is seen. No gallstones, gallbladder wall thickening, or biliary dilatation. Pancreas: Pancreatic atrophy without ductal dilatation or inflammatory changes. Spleen: Normal in size without focal abnormality. Adrenals/Urinary Tract: Unchanged renal gland thickening without well-defined nodule. Kidneys enhance symmetrically. No renal lesion, stone, or hydronephrosis. Small bubble of air within the urinary bladder, presumably related to prior instrumentation. Previously seen Foley catheter has been removed. Stomach/Bowel: Stomach is within normal limits. Appendix appears normal. No evidence of bowel wall thickening, distention, or inflammatory changes. Large volume of stool throughout the colon. Vascular/Lymphatic: No significant vascular findings are present. No enlarged abdominal or pelvic lymph nodes. Reproductive: Continued improvement in the appearance of the prostate gland following surgical unroofing. Small areas of ill-defined low attenuation within the prostate apex, more prominent on the left (series 2, image 96). No rim enhancement is evident. Other: Interval placement of a percutaneous pigtail drainage catheter within the left psoas collection. Persistent but decreasing size of fluid collection in the left psoas muscle. Small foci of air are now present within the collection, likely related to recent drain placement. The right-sided psoas abscess is not significantly changed in size compared to 08/21/2022 (series 2, image 65). No gas within the collection. No ascites. No new fluid collections. No pneumoperitoneum. Musculoskeletal: No new or acute bony findings. Degenerative disc disease at L3-4 and L4-5 has not appreciably changed. IMPRESSION: 1. Interval placement of a percutaneous pigtail drainage catheter within the left psoas collection. Persistent but  decreasing size of fluid collection in the left psoas muscle. 2. The right-sided psoas abscess is not significantly changed in size compared to 08/21/2022. 3. Continued improvement in the appearance of the prostate gland following surgical unroofing. Small areas of ill-defined low attenuation within the prostate apex, more prominent on the left. 4. Large volume of stool throughout the colon. Electronically Signed   By: Duanne Guess D.O.   On: 08/28/2022 10:01   ECHO TEE  Result Date: 08/27/2022    TRANSESOPHOGEAL ECHO REPORT   Patient Name:   William [name] Date of Exam: 08/27/2022 Medical Rec #:  [id number]        Height:       76.0 in Accession #:    [id number]       Weight:       380.3 lb Date of Birth:  1960/10/16         BSA:          2.911 m Patient Age:    61 years         BP:           132/88 mmHg Patient Gender: M                HR:           85 bpm. Exam Location:  Inpatient Procedure: Transesophageal Echo, Cardiac Doppler and Color Doppler Indications:     Bacteremia  History:         Patient has no prior history of Echocardiogram examinations.                  Abnormal ECG, Arrythmias:Atrial Fibrillation,                  Signs/Symptoms:Bacteremia; Risk Factors:Dyslipidemia and                  Diabetes.  Sonographer:     Sheralyn Boatman RDCS Referring Phys:  [facility] Diagnosing Phys: Thurmon Fair MD PROCEDURE: After discussion of the risks and benefits of a TEE, an informed consent was obtained from the patient. The transesophogeal probe was passed without difficulty through the esophogus of the patient. Imaged were obtained with the patient in a left lateral decubitus and supine position. Sedation performed by different physician. The patient was monitored while under deep sedation. Anesthestetic sedation was provided intravenously by Anesthesiology: 260mg  of Propofol. The patient's vital signs;  including heart rate, blood pressure, and oxygen saturation; remained stable throughout the procedure.  The patient developed no complications during the procedure.  IMPRESSIONS  1. Left ventricular ejection fraction, by estimation, is 60 to 65%. The left ventricle has normal function. The left ventricle has no regional wall motion abnormalities. There is mild concentric left ventricular hypertrophy.  2. Right ventricular systolic function is normal. The right ventricular size is normal.  3. No left atrial/left atrial appendage thrombus was detected.  4. The mitral valve is normal in structure. Trivial mitral valve regurgitation. No evidence of mitral stenosis.  5. The aortic valve is normal in structure. Aortic valve regurgitation is not visualized. No aortic stenosis is present.  6. The inferior vena cava is normal in size with greater than 50% respiratory variability, suggesting right atrial pressure of 3 mmHg. Conclusion(s)/Recommendation(s): Normal biventricular function without evidence of hemodynamically significant valvular heart disease. FINDINGS  Left Ventricle: Left ventricular ejection fraction, by estimation, is 60 to 65%. The left ventricle has normal function. The left ventricle has no regional wall motion abnormalities. The  left ventricular internal cavity size was normal in size. There is  mild concentric left ventricular hypertrophy. Right Ventricle: The right ventricular size is normal. No increase in right ventricular wall thickness. Right ventricular systolic function is normal. Left Atrium: Left atrial size was normal in size. No left atrial/left atrial appendage thrombus was detected. Right Atrium: Right atrial size was normal in size. Pericardium: There is no evidence of pericardial effusion. Mitral Valve: The mitral valve is normal in structure. Trivial mitral valve regurgitation. No evidence of mitral valve stenosis. Tricuspid Valve: The tricuspid valve is normal in structure. Tricuspid valve regurgitation is not demonstrated. No evidence of tricuspid stenosis. Aortic Valve: The aortic valve  is normal in structure. Aortic valve regurgitation is not visualized. No aortic stenosis is present. Pulmonic Valve: The pulmonic valve was normal in structure. Pulmonic valve regurgitation is not visualized. No evidence of pulmonic stenosis. Aorta: The aortic root is normal in size and structure. Venous: The inferior vena cava is normal in size with greater than 50% respiratory variability, suggesting right atrial pressure of 3 mmHg. IAS/Shunts: No atrial level shunt detected by color flow Doppler. Additional Comments: Spectral Doppler performed. Rachelle Hora Croitoru MD Electronically signed by Thurmon Fair MD Signature Date/Time: 08/27/2022/3:12:17 PM    Final     Labs:  CBC: Recent Labs    08/21/22 1043 08/23/22 0556 08/24/22 0628 08/26/22 0500  WBC 14.3* 12.2* 12.7* 11.2*  HGB 10.6* 10.8* 10.4* 10.2*  HCT 33.1* 34.3* 32.1* 32.1*  PLT 312 330 317 277    COAGS: Recent Labs    08/17/22 0115  INR 1.4*    BMP: Recent Labs    08/21/22 1043 08/23/22 0556 08/25/22 0539 08/26/22 0500  NA 132* 131* 134* 131*  K 3.5 3.5 3.4* 4.0  CL 94* 95* 97* 98  CO2 30 28 29 28   GLUCOSE 248* 340* 175* 206*  BUN 11 17 15 18   CALCIUM 7.6* 7.6* 7.8* 8.0*  CREATININE 0.72 0.78 0.75 0.70  GFRNONAA >60 >60 >60 >60    LIVER FUNCTION TESTS: Recent Labs    08/19/22 1025 08/20/22 0543 08/23/22 0556 08/26/22 0500  BILITOT 0.5 0.7 0.5 0.5  AST 28 29 31 25   ALT 30 26 24 24   ALKPHOS 352* 322* 281* 234*  PROT 5.6* 5.3* 6.3* 6.6  ALBUMIN 1.7* 1.7* 2.1* 2.4*    Assessment and Plan: Pt with hx prostate/bilat psoas abscesses; s/p right psoas collection asp/left psoas collection drain placement 1/18 (10 fr to JP) ; afebrile; no new labs; drain fl cx- few staph; f/u CT A/P today:  1. Interval placement of a percutaneous pigtail drainage catheter within the left psoas collection. Persistent but decreasing size of fluid collection in the left psoas muscle. 2. The right-sided psoas abscess is not  significantly changed in size compared to 08/21/2022. 3. Continued improvement in the appearance of the prostate gland following surgical unroofing. Small areas of ill-defined low attenuation within the prostate apex, more prominent on the left. 4. Large volume of stool throughout the colon.  Images were reviewed by Dr. - cont current tx for now with drain flushes/close output monitoring, lab checks, check fluid creat level; f/u imaging in 1 week  Electronically Signed: D. , PA-C 08/28/2022, 12:30 PM   I spent a total of 15 Minutes at the the patient's bedside AND on the patient's hospital floor or unit, greater than 50% of which was counseling/coordinating care for left psoas abscess drain    Patient ID: 08/23/2022, male  DOB: Aug 08, 1960, 62 y.o.   MRN: [id number]

## 2022-08-28 NOTE — Plan of Care (Signed)
Patient AOX4, VSS throughout shift.  All meds given on time as ordered.  Pt c/o pain relieved by PRN oxycodone.  Diminished lungs, IS taught and encouraged.  Left foot dressing changed, CHG bath given and new purewick placed.  Sacrum mepliex changed.  POC maintained, will continue to monitor.  Problem: Education: Goal: Knowledge of General Education information will improve Description: Including pain rating scale, medication(s)/side effects and non-pharmacologic comfort measures Outcome: Progressing   Problem: Health Behavior/Discharge Planning: Goal: Ability to manage health-related needs will improve Outcome: Progressing   Problem: Clinical Measurements: Goal: Ability to maintain clinical measurements within normal limits will improve Outcome: Progressing Goal: Will remain free from infection Outcome: Progressing Goal: Diagnostic test results will improve Outcome: Progressing Goal: Respiratory complications will improve Outcome: Progressing Goal: Cardiovascular complication will be avoided Outcome: Progressing   Problem: Activity: Goal: Risk for activity intolerance will decrease Outcome: Progressing   Problem: Nutrition: Goal: Adequate nutrition will be maintained Outcome: Progressing   Problem: Coping: Goal: Level of anxiety will decrease Outcome: Progressing   Problem: Elimination: Goal: Will not experience complications related to bowel motility Outcome: Progressing Goal: Will not experience complications related to urinary retention Outcome: Progressing   Problem: Pain Managment: Goal: General experience of comfort will improve Outcome: Progressing   Problem: Safety: Goal: Ability to remain free from injury will improve Outcome: Progressing   Problem: Skin Integrity: Goal: Risk for impaired skin integrity will decrease Outcome: Progressing   Problem: Education: Goal: Ability to describe self-care measures that may prevent or decrease complications  (Diabetes Survival Skills Education) will improve Outcome: Progressing Goal: Individualized Educational Video(s) Outcome: Progressing   Problem: Coping: Goal: Ability to adjust to condition or change in health will improve Outcome: Progressing   Problem: Fluid Volume: Goal: Ability to maintain a balanced intake and output will improve Outcome: Progressing   Problem: Health Behavior/Discharge Planning: Goal: Ability to identify and utilize available resources and services will improve Outcome: Progressing Goal: Ability to manage health-related needs will improve Outcome: Progressing   Problem: Metabolic: Goal: Ability to maintain appropriate glucose levels will improve Outcome: Progressing   Problem: Nutritional: Goal: Maintenance of adequate nutrition will improve Outcome: Progressing Goal: Progress toward achieving an optimal weight will improve Outcome: Progressing   Problem: Skin Integrity: Goal: Risk for impaired skin integrity will decrease Outcome: Progressing   Problem: Tissue Perfusion: Goal: Adequacy of tissue perfusion will improve Outcome: Progressing

## 2022-08-28 NOTE — TOC Progression Note (Addendum)
Transition of Care [facility]) - Progression Note    Patient Details  Name: William [name] MRN: [id number] Date of Birth: 1961-06-04  [facility] CM/SW Contact  Henrietta Dine, RN Phone Number: 08/28/2022, 5:26 PM  Clinical Narrative:    Damaris Schooner w/ pt in room; attempted to contact pt's wife at # [phone]; message says [facility]; unable to leave message; pt notified he was not accepted by [facility]; pt says he would like to go to SNF with search limited to [facility], and preference of [facility]; pt says he is continent of bowel and bladder, and he can feed himself; pt says he has no glasses, HA or dentures; pt also says he does not DME , [place] services, or home oxygen; he denies IPV, food insecurity, or problems paying utilities; explained SNF process and pt verbalized understanding; FL2 completed and sent out for bed offers.   Expected Discharge Plan: [place] Barriers to Discharge: Continued Medical Work up  Expected Discharge Plan and Services   Discharge Planning Services: CM Consult   Living arrangements for the past 2 months: Single Family Home                                       Social Determinants of Health (SDOH) Interventions SDOH Screenings   Food Insecurity: No Food Insecurity (08/28/2022)  Housing: Low Risk  (08/28/2022)  Transportation Needs: No Transportation Needs (08/28/2022)  Utilities: Not At Risk (08/28/2022)  Tobacco Use: Low Risk  (08/27/2022)    Readmission Risk Interventions     No data to display

## 2022-08-28 NOTE — Progress Notes (Signed)
PROGRESS NOTE  Jos Cygan  JJO:[id number] DOB: 14-Mar-1961 DOA: 08/16/2022 PCP: Pcp, No   Brief Narrative: Patient is a 62 year old male with history of diabetes type 2, hypertension, osteomyelitis of left great toe status post amputation, hyperlipidemia, severe OSA, atrial fibrillation not on anticoagulation who was transferred here from [facility] after he presented with left flank pain in the setting of  prostatic abscess.  He was recently diagnosed with prostatitis and was treated with antibiotic for MSSA bacteremia.  Urology, IR consulted on admission.  ID also was following.  Plan for 6 weeks of antibiotics.  PT/OT recommending acute inpatient rehab  Assessment & Plan:  Principal Problem:   MSSA bacteremia Active Problems:   Prostatic abscess   Possible psoas muscle infection   Osteomyelitis of left foot (HCC)   Hyperthyroidism   Hypercholesteremia   Morbid obesity (HCC)   Normocytic anemia   Paroxysmal atrial fibrillation (HCC)   Severe obstructive sleep apnea   Type 2 diabetes mellitus (HCC)   Hypokalemia   Hyponatremia   Malnutrition of moderate degree   MSSA bacteremia: Patient had positive blood culture at [facility] emergency department for MSSA.  Blood culture drawn here on 1/13 was negative till date.  Source thought to be secondary to ulcer on the foot with dissemination to the prostate, bilateral psoas muscles.  TEE negative for vegetation.  ID following, plan for 6 weeks of antibiotics. Currently on cefazolin.  PICC line placed on 1/19.  Bilateral psoas fluid collection: CT abdomen/pelvis showed bilateral iliopsoas fluid collection, right-sided collection slightly larger . IR consulted and he underwent CT-guided aspiration, drainage of fluid, drain placement of left psoas abscess.  Continue pain management, supportive care. CT abdomen/pelvis with contrast done on 1/24  for follow-up showed persistent but decreasing size of fluid collection in the left psoas  muscle. ,no significant  change in the right-sided psoas abscess compared to 08/21/2022.  IR following.  Prostatic abscess: Status post TURP, posterior abscess unroofing on 1/14 by Dr. Louis Meckel.  Foley discontinued  Left foot osteomyelitis: Status post left great toe partial amputation on  1/11 at [facility].  As per podiatry, continue dressing changes daily with Xeroform, wrapped in gauze, covered in Ace wrap.  Weightbearing as tolerated in postop shoe.  After 2 weeks, around Jan 25, stop Xeroform dressing and use Adaptic.  Leave sutures in place for 1 month, removal in February 11.  Follow-up with Dr. Rocco Serene in [place] in a month  Hyperthyroidism: The TSH is normal.  Follow-up with endocrinology as an outpatient  Type 2 diabetes: Uncontrolled.  A1c of more than 12.  Continue current insulin regimen.  OSA: On CPAP at night  Paroxysmal A-fib:New diagnosis at this  admission.  CHA2DS2-Vasc 1.    Anticoagulation deferred as per above.  Currently in normal sinus rhythm  Obesity: BMI of 47  Nutrition Problem: Moderate Malnutrition Etiology: acute illness (osteomyelitis) Pressure Injury 08/25/22 Buttocks Medial;Mid Stage 2 -  Partial thickness loss of dermis presenting as a shallow open injury with a red, pink wound bed without slough. Circular, pink (Active)  08/25/22 1400  Location: Buttocks  Location Orientation: Medial;Mid  Staging: Stage 2 -  Partial thickness loss of dermis presenting as a shallow open injury with a red, pink wound bed without slough.  Wound Description (Comments): Circular, pink  Present on Admission:   Dressing Type Foam - Lift dressing to assess site every shift;Gauze (Comment) 08/28/22 1038     Pressure Injury 08/25/22 Ischial tuberosity Right Stage 2 -  Partial thickness loss of dermis presenting as a shallow open injury with a red, pink wound bed without slough. Circular, pink (Active)  08/25/22 1400  Location: Ischial tuberosity  Location Orientation: Right   Staging: Stage 2 -  Partial thickness loss of dermis presenting as a shallow open injury with a red, pink wound bed without slough.  Wound Description (Comments): Circular, pink  Present on Admission:   Dressing Type Foam - Lift dressing to assess site every shift 08/27/22 2000     Pressure Injury 08/25/22 Buttocks Right Stage 2 -  Partial thickness loss of dermis presenting as a shallow open injury with a red, pink wound bed without slough. Circula, pink (Active)  08/25/22 1400  Location: Buttocks  Location Orientation: Right  Staging: Stage 2 -  Partial thickness loss of dermis presenting as a shallow open injury with a red, pink wound bed without slough.  Wound Description (Comments): Circula, pink  Present on Admission:   Dressing Type Foam - Lift dressing to assess site every shift 08/27/22 2000    DVT prophylaxis:SCDs Start: 08/17/22 0026     Code Status: Full Code  Family Communication: None at bedside  Patient status:Inpatient  Patient is from :Home  Anticipated discharge to:[facility]  Estimated DC date:not sure,waiting for placement   Consultants: ID,urology,IR  Procedures:drain placement  Antimicrobials:  Anti-infectives (From admission, onward)    Start     Dose/Rate Route Frequency Ordered Stop   08/18/22 2200  ceFAZolin (ANCEF) IVPB 2g/100 mL premix        2 g 200 mL/hr over 30 Minutes Intravenous Every 8 hours 08/18/22 1712     08/18/22 0200  cefTRIAXone (ROCEPHIN) 2 g in sodium chloride 0.9 % 100 mL IVPB  Status:  Discontinued        2 g 200 mL/hr over 30 Minutes Intravenous Every 24 hours 08/17/22 1304 08/18/22 1712   08/17/22 1400  vancomycin (VANCOREADY) IVPB 1750 mg/350 mL  Status:  Discontinued        1,750 mg 175 mL/hr over 120 Minutes Intravenous Every 12 hours 08/17/22 0049 08/18/22 1712   08/17/22 0130  cefTRIAXone (ROCEPHIN) 1 g in sodium chloride 0.9 % 100 mL IVPB  Status:  Discontinued        1 g 200 mL/hr over 30 Minutes Intravenous Every 24  hours 08/17/22 0039 08/17/22 1304   08/17/22 0130  vancomycin (VANCOREADY) IVPB 2000 mg/400 mL        2,000 mg 200 mL/hr over 120 Minutes Intravenous  Once 08/17/22 0042 08/17/22 0418       Subjective: Patient seen and examined at bedside today.  Hemodynamically stable lying in bed.  Denies any new complaints.  Looks comfortable.  Objective: Vitals:   08/27/22 1540 08/27/22 2100 08/28/22 0540 08/28/22 1312  BP: (!) 139/95 133/82 (!) 122/97 (!) 185/160  Pulse: 92 99 100 (!) 45  Resp: 18  18 18   Temp:  98.3 F (36.8 C) 98.2 F (36.8 C) 98.8 F (37.1 C)  TempSrc:  Oral Oral Oral  SpO2: 96% 95% 92% 100%  Weight:      Height:        Intake/Output Summary (Last 24 hours) at 08/28/2022 1332 Last data filed at 08/28/2022 0915 Gross per 24 hour  Intake 275.44 ml  Output 1100 ml  Net -824.56 ml   Filed Weights   08/24/22 0845 08/25/22 0540 08/26/22 0500  Weight: (!) 177.8 kg (!) 177.1 kg (!) 172.5 kg    Examination:  General  exam: Overall comfortable, not in distress, morbidly obese HEENT: PERRL Respiratory system:  no wheezes or crackles  Cardiovascular system: S1 & S2 heard, RRR.  Gastrointestinal system: Abdomen is nondistended, soft and nontender. Central nervous system: Alert and oriented Extremities: No edema, no clubbing ,no cyanosis,dressing on the left foot,picc line on left arm Skin: No rashes,no icterus     Data Reviewed: I have personally reviewed following labs and imaging studies  CBC: Recent Labs  Lab 08/23/22 0556 08/24/22 0628 08/26/22 0500  WBC 12.2* 12.7* 11.2*  HGB 10.8* 10.4* 10.2*  HCT 34.3* 32.1* 32.1*  MCV 82.3 83.8 83.8  PLT 330 317 510   Basic Metabolic Panel: Recent Labs  Lab 08/23/22 0556 08/25/22 0539 08/26/22 0500  NA 131* 134* 131*  K 3.5 3.4* 4.0  CL 95* 97* 98  CO2 28 29 28   GLUCOSE 340* 175* 206*  BUN 17 15 18   CREATININE 0.78 0.75 0.70  CALCIUM 7.6* 7.8* 8.0*     Recent Results (from the past 240 hour(s))   Culture, blood (Routine X 2) w Reflex to ID Panel     Status: None   Collection Time: 08/22/22  1:03 PM   Specimen: BLOOD LEFT HAND  Result Value Ref Range Status   Specimen Description   Final    BLOOD LEFT HAND Performed at [facility], [address], [place], [place] [address]    Special Requests   Final    BOTTLES DRAWN AEROBIC AND ANAEROBIC Blood Culture adequate volume Performed at [facility] [address], [place], [place] [address]    Culture   Final    NO GROWTH 5 DAYS Performed at [facility], [place] [address], [place], [place] [address]    Report Status 08/27/2022 FINAL  Final  Culture, blood (Routine X 2) w Reflex to ID Panel     Status: None   Collection Time: 08/22/22  1:09 PM   Specimen: BLOOD  Result Value Ref Range Status   Specimen Description   Final    BLOOD BLOOD RIGHT HAND AEROBIC BOTTLE ONLY ANAEROBIC BOTTLE ONLY Performed at [facility] [address] [place], [place] [address]    Special Requests   Final    BOTTLES DRAWN AEROBIC AND ANAEROBIC Blood Culture adequate volume Performed at [facility] [address], [place], [place] [address]    Culture   Final    NO GROWTH 5 DAYS Performed at [facility], [place] [address], [place], [place] [address]    Report Status 08/27/2022 FINAL  Final  Aerobic/Anaerobic Culture w Gram Stain (surgical/deep wound)     Status: None   Collection Time: 08/22/22  5:38 PM   Specimen: Abscess  Result Value Ref Range Status   Specimen Description   Final    ABSCESS Performed at [facility] [address], [place], [place] [address]    Special Requests LEFT PSOAS  Final   Gram Stain   Final    ABUNDANT WBC PRESENT, PREDOMINANTLY PMN MODERATE GRAM POSITIVE COCCI IN PAIRS    Culture   Final    FEW STAPHYLOCOCCUS AUREUS NO ANAEROBES ISOLATED Performed at [facility], [place] [address] [place],   [address]    Report Status 08/27/2022 FINAL  Final   Organism ID, Bacteria STAPHYLOCOCCUS AUREUS  Final      Susceptibility   Staphylococcus aureus - MIC*    CIPROFLOXACIN <=0.5 SENSITIVE Sensitive  ERYTHROMYCIN <=0.25 SENSITIVE Sensitive     GENTAMICIN <=0.5 SENSITIVE Sensitive     OXACILLIN <=0.25 SENSITIVE Sensitive     TETRACYCLINE <=1 SENSITIVE Sensitive     VANCOMYCIN 1 SENSITIVE Sensitive     TRIMETH/SULFA <=10 SENSITIVE Sensitive     CLINDAMYCIN <=0.25 SENSITIVE Sensitive     RIFAMPIN <=0.5 SENSITIVE Sensitive     Inducible Clindamycin NEGATIVE Sensitive     * FEW STAPHYLOCOCCUS AUREUS     Radiology Studies: CT ABDOMEN PELVIS W CONTRAST  Result Date: 08/28/2022 CLINICAL DATA:  Evaluate bilateral psoas muscle abscesses EXAM: CT ABDOMEN AND PELVIS WITH CONTRAST TECHNIQUE: Multidetector CT imaging of the abdomen and pelvis was performed using the standard protocol following bolus administration of intravenous contrast. RADIATION DOSE REDUCTION: This exam was performed according to the departmental dose-optimization program which includes automated exposure control, adjustment of the mA and/or kV according to patient size and/or use of iterative reconstruction technique. CONTRAST:  OMNIPAQUE IOHEXOL 300 MG/ML  SOLN COMPARISON:  CT 08/21/2022, 08/16/2022, 08/06/2022 FINDINGS: Lower chest: No acute abnormality. Hepatobiliary: No focal liver abnormality is seen. No gallstones, gallbladder wall thickening, or biliary dilatation. Pancreas: Pancreatic atrophy without ductal dilatation or inflammatory changes. Spleen: Normal in size without focal abnormality. Adrenals/Urinary Tract: Unchanged renal gland thickening without well-defined nodule. Kidneys enhance symmetrically. No renal lesion, stone, or hydronephrosis. Small bubble of air within the urinary bladder, presumably related to prior instrumentation. Previously seen Foley catheter has been removed. Stomach/Bowel: Stomach is within  normal limits. Appendix appears normal. No evidence of bowel wall thickening, distention, or inflammatory changes. Large volume of stool throughout the colon. Vascular/Lymphatic: No significant vascular findings are present. No enlarged abdominal or pelvic lymph nodes. Reproductive: Continued improvement in the appearance of the prostate gland following surgical unroofing. Small areas of ill-defined low attenuation within the prostate apex, more prominent on the left (series 2, image 96). No rim enhancement is evident. Other: Interval placement of a percutaneous pigtail drainage catheter within the left psoas collection. Persistent but decreasing size of fluid collection in the left psoas muscle. Small foci of air are now present within the collection, likely related to recent drain placement. The right-sided psoas abscess is not significantly changed in size compared to 08/21/2022 (series 2, image 65). No gas within the collection. No ascites. No new fluid collections. No pneumoperitoneum. Musculoskeletal: No new or acute bony findings. Degenerative disc disease at L3-4 and L4-5 has not appreciably changed. IMPRESSION: 1. Interval placement of a percutaneous pigtail drainage catheter within the left psoas collection. Persistent but decreasing size of fluid collection in the left psoas muscle. 2. The right-sided psoas abscess is not significantly changed in size compared to 08/21/2022. 3. Continued improvement in the appearance of the prostate gland following surgical unroofing. Small areas of ill-defined low attenuation within the prostate apex, more prominent on the left. 4. Large volume of stool throughout the colon. Electronically Signed   By: Duanne Guess D.O.   On: 08/28/2022 10:01   ECHO TEE  Result Date: 08/27/2022    TRANSESOPHOGEAL ECHO REPORT   Patient Name:   Othal Kubitz Date of Exam: 08/27/2022 Medical Rec #:  [id number]        Height:       76.0 in Accession #:    [id number]       Weight:        380.3 lb Date of Birth:  13-Dec-1960         BSA:  2.911 m Patient Age:    61 years         BP:           132/88 mmHg Patient Gender: M                HR:           85 bpm. Exam Location:  Inpatient Procedure: Transesophageal Echo, Cardiac Doppler and Color Doppler Indications:     Bacteremia  History:         Patient has no prior history of Echocardiogram examinations.                  Abnormal ECG, Arrythmias:Atrial Fibrillation,                  Signs/Symptoms:Bacteremia; Risk Factors:Dyslipidemia and                  Diabetes.  Sonographer:     Sheralyn Boatman RDCS Referring Phys:  [facility] Diagnosing Phys: Thurmon Fair MD PROCEDURE: After discussion of the risks and benefits of a TEE, an informed consent was obtained from the patient. The transesophogeal probe was passed without difficulty through the esophogus of the patient. Imaged were obtained with the patient in a left lateral decubitus and supine position. Sedation performed by different physician. The patient was monitored while under deep sedation. Anesthestetic sedation was provided intravenously by Anesthesiology: 260mg  of Propofol. The patient's vital signs;  including heart rate, blood pressure, and oxygen saturation; remained stable throughout the procedure. The patient developed no complications during the procedure.  IMPRESSIONS  1. Left ventricular ejection fraction, by estimation, is 60 to 65%. The left ventricle has normal function. The left ventricle has no regional wall motion abnormalities. There is mild concentric left ventricular hypertrophy.  2. Right ventricular systolic function is normal. The right ventricular size is normal.  3. No left atrial/left atrial appendage thrombus was detected.  4. The mitral valve is normal in structure. Trivial mitral valve regurgitation. No evidence of mitral stenosis.  5. The aortic valve is normal in structure. Aortic valve regurgitation is not visualized. No aortic stenosis is present.  6. The  inferior vena cava is normal in size with greater than 50% respiratory variability, suggesting right atrial pressure of 3 mmHg. Conclusion(s)/Recommendation(s): Normal biventricular function without evidence of hemodynamically significant valvular heart disease. FINDINGS  Left Ventricle: Left ventricular ejection fraction, by estimation, is 60 to 65%. The left ventricle has normal function. The left ventricle has no regional wall motion abnormalities. The left ventricular internal cavity size was normal in size. There is  mild concentric left ventricular hypertrophy. Right Ventricle: The right ventricular size is normal. No increase in right ventricular wall thickness. Right ventricular systolic function is normal. Left Atrium: Left atrial size was normal in size. No left atrial/left atrial appendage thrombus was detected. Right Atrium: Right atrial size was normal in size. Pericardium: There is no evidence of pericardial effusion. Mitral Valve: The mitral valve is normal in structure. Trivial mitral valve regurgitation. No evidence of mitral valve stenosis. Tricuspid Valve: The tricuspid valve is normal in structure. Tricuspid valve regurgitation is not demonstrated. No evidence of tricuspid stenosis. Aortic Valve: The aortic valve is normal in structure. Aortic valve regurgitation is not visualized. No aortic stenosis is present. Pulmonic Valve: The pulmonic valve was normal in structure. Pulmonic valve regurgitation is not visualized. No evidence of pulmonic stenosis. Aorta: The aortic root is normal in size and structure. Venous: The inferior vena  cava is normal in size with greater than 50% respiratory variability, suggesting right atrial pressure of 3 mmHg. IAS/Shunts: No atrial level shunt detected by color flow Doppler. Additional Comments: Spectral Doppler performed. Rachelle Hora Croitoru MD Electronically signed by Thurmon Fair MD Signature Date/Time: 08/27/2022/3:12:17 PM    Final     Scheduled Meds:   Chlorhexidine Gluconate Cloth  6 each Topical Daily   insulin aspart  0-15 Units Subcutaneous TID WC   insulin aspart  3 Units Subcutaneous TID WC   insulin glargine-yfgn  18 Units Subcutaneous QHS   multivitamin with minerals  1 tablet Oral Daily   Ensure Max Protein  11 oz Oral BID   senna-docusate  2 tablet Oral BID   sodium chloride (PF)       sodium chloride flush  10-40 mL Intracatheter Q12H   sodium chloride flush  5 mL Intracatheter Q8H   tamsulosin  0.4 mg Oral Daily   Continuous Infusions:   ceFAZolin (ANCEF) IV 2 g (08/28/22 0601)   sodium chloride irrigation       LOS: 12 days   Burnadette Pop, MD [facility] P1/24/2024, 1:32 PM

## 2022-08-28 NOTE — NC FL2 (Cosign Needed Addendum)
[place] LEVEL OF CARE FORM     IDENTIFICATION  Patient Name: William [name] Birthdate: 1961/05/10 Sex: male Admission Date (Current Location): 08/16/2022  [facility] and [place] Number:  Herbalist and Address:  [facility],  [place], [place]      Provider Number: [id number]  Attending Physician Name and Address:  Shelly Coss, MD  Relative Name and Phone Number:  Olander Friedl (spouse) [phone]    Current Level of Care: Hospital Recommended Level of Care: [place] Prior Approval Number:    Date Approved/Denied:   PASRR Number: [id number]  Discharge Plan: SNF    Current Diagnoses: Patient Active Problem List   Diagnosis Date Noted   Malnutrition of moderate degree 08/21/2022   Hyponatremia 08/20/2022   Hyperthyroidism 08/19/2022   MSSA bacteremia 08/18/2022   Hypokalemia 08/18/2022   Prostatic abscess 08/17/2022   Normocytic anemia 08/17/2022   Type 2 diabetes mellitus ([place]) 08/17/2022   Possible psoas muscle infection 08/17/2022   AKI (acute kidney injury) ([place]) 08/12/2022   Paroxysmal atrial fibrillation ([place]) 08/12/2022   DKA (diabetic ketoacidosis) ([place]) 08/12/2022   Leucocytosis 08/12/2022   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   Osteomyelitis of left foot ([place]) 08/12/2022   Sepsis with organ dysfunction ([place]) 08/12/2022   Severe obstructive sleep apnea 08/12/2022   Acute prostatitis 08/09/2022   Urinary tract infection without hematuria 08/09/2022   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 diabetes mellitus, with fat layer exposed ([place]) 09/07/2021   Diabetes mellitus with hyperglycemia, without long-term current use of insulin ([place]) 05/23/2016   Hypercholesteremia 05/23/2016   Morbid obesity ([place]) 05/23/2016    Orientation RESPIRATION BLADDER Height & Weight     Self, Time, Situation, Place  Other (Comment) (CPAP at night) Continent Weight: (!) 172.5  kg Height:  6\' 4"  (193 cm)  BEHAVIORAL SYMPTOMS/MOOD NEUROLOGICAL BOWEL NUTRITION STATUS      Continent Diet (carb modified)  AMBULATORY STATUS COMMUNICATION OF NEEDS Skin   Extensive Assist Verbally Other (Comment), Surgical wounds (dry flaky skin; multple areas of stage 2 presure injuries; surgical incision to left foot s/p toe amputation; see note to facility)                       Personal Care Assistance Level of Assistance  Bathing, Feeding, Dressing Bathing Assistance: Maximum assistance Feeding assistance: Independent Dressing Assistance: Maximum assistance     Functional Limitations Info  Sight, Hearing, Speech Sight Info: Adequate Hearing Info: Adequate Speech Info: Adequate    SPECIAL CARE FACTORS FREQUENCY  PT (By licensed PT), OT (By licensed OT)     PT Frequency: 5x/week OT Frequency: 5x/week            Contractures Contractures Info: Present    Additional Factors Info  Code Status, Allergies, Insulin Sliding Scale Code Status Info: Full Allergies Info: Diltiazem Hcl   Insulin Sliding Scale Info: See MAR       Current Medications (08/28/2022):  This is the current hospital active medication list Current Facility-Administered Medications  Medication Dose Route Frequency Provider Last Rate Last Admin   acetaminophen (TYLENOL) tablet 650 mg  650 mg Oral Q6H PRN Croitoru, Mihai, MD   650 mg at 08/28/22 1238   Or   acetaminophen (TYLENOL) suppository 650 mg  650 mg Rectal Q6H PRN Croitoru, Mihai, MD       bisacodyl (DULCOLAX) suppository 10 mg  10 mg Rectal Daily PRN Croitoru, Mihai,  MD       ceFAZolin (ANCEF) IVPB 2g/100 mL premix  2 g Intravenous Q8H Croitoru, Mihai, MD 200 mL/hr at 08/28/22 1405 2 g at 08/28/22 1405   Chlorhexidine Gluconate Cloth 2 % PADS 6 each  6 each Topical Daily Croitoru, Mihai, MD   6 each at 08/28/22 1003   fentaNYL (SUBLIMAZE) injection 50 mcg  50 mcg Intravenous Q2H PRN Croitoru, Mihai, MD   50 mcg at 08/25/22 1027    insulin aspart (novoLOG) injection 0-15 Units  0-15 Units Subcutaneous TID WC Croitoru, Mihai, MD   3 Units at 08/28/22 1749   insulin aspart (novoLOG) injection 3 Units  3 Units Subcutaneous TID WC Croitoru, Mihai, MD   3 Units at 08/28/22 1749   insulin glargine-yfgn (SEMGLEE) injection 18 Units  18 Units Subcutaneous QHS Croitoru, Mihai, MD   18 Units at 08/27/22 2204   multivitamin with minerals tablet 1 tablet  1 tablet Oral Daily Croitoru, Mihai, MD   1 tablet at 08/28/22 0957   naloxone (NARCAN) injection 0.4 mg  0.4 mg Intravenous PRN Croitoru, Mihai, MD       oxyCODONE (Oxy IR/ROXICODONE) immediate release tablet 10 mg  10 mg Oral Q4H PRN Croitoru, Mihai, MD   10 mg at 08/28/22 1759   polyethylene glycol (MIRALAX / GLYCOLAX) packet 17 g  17 g Oral Daily Shelly Coss, MD   17 g at 08/28/22 1403   protein supplement (ENSURE MAX) liquid  11 oz Oral BID Croitoru, Mihai, MD   11 oz at 08/28/22 1005   senna-docusate (Senokot-S) tablet 2 tablet  2 tablet Oral BID Croitoru, Mihai, MD   2 tablet at 08/28/22 0957   sodium chloride (PF) 0.9 % injection            sodium chloride flush (NS) 0.9 % injection 10-40 mL  10-40 mL Intracatheter Q12H Croitoru, Mihai, MD   10 mL at 08/27/22 2203   sodium chloride flush (NS) 0.9 % injection 10-40 mL  10-40 mL Intracatheter PRN Croitoru, Mihai, MD       sodium chloride flush (NS) 0.9 % injection 5 mL  5 mL Intracatheter Q8H Croitoru, Mihai, MD   5 mL at 08/28/22 1405   sodium chloride irrigation 0.9 % 3,000 mL  3,000 mL Irrigation Continuous Croitoru, Mihai, MD   3,000 mL at 08/19/22 0553   tamsulosin (FLOMAX) capsule 0.4 mg  0.4 mg Oral Daily Croitoru, Mihai, MD   0.4 mg at 08/28/22 0272     Discharge Medications: Please see discharge summary for a list of discharge medications.  Relevant Imaging Results:  Relevant Lab Results:   Additional Information SSN [id number]; 172.5 kg; 6\' 4" ; will need IV antibiotics x 6 weeks; single lumen PICC placed  08/23/22; JP drain LLQ placed 08/22/22 Will need IV antibiotics after discharge Henrietta Dine, RN

## 2022-08-28 NOTE — NC FL2 (Deleted)
[place] LEVEL OF CARE FORM     IDENTIFICATION  Patient Name: William [name] Birthdate: [birth year] Sex: male Admission Date (Current Location): 08/16/2022  [place] and [place] Number:  Herbalist and Address:  [facility],  [place], [place]      Provider Number: [id number]  Attending Physician Name and Address:  Shelly Coss, MD  Relative Name and Phone Number:  Issaac Shipper (spouse) [phone]    Current Level of Care: Hospital Recommended Level of Care: [place] Prior Approval Number:    Date Approved/Denied:   PASRR Number: [id number]  Discharge Plan: SNF    Current Diagnoses: Patient Active Problem List   Diagnosis Date Noted   Malnutrition of moderate degree 08/21/2022   Hyponatremia 08/20/2022   Hyperthyroidism 08/19/2022   MSSA bacteremia 08/18/2022   Hypokalemia 08/18/2022   Prostatic abscess 08/17/2022   Normocytic anemia 08/17/2022   Type 2 diabetes mellitus ([place]) 08/17/2022   Possible psoas muscle infection 08/17/2022   AKI (acute kidney injury) ([place]) 08/12/2022   Paroxysmal atrial fibrillation ([place]) 08/12/2022   DKA (diabetic ketoacidosis) ([place]) 08/12/2022   Leucocytosis 08/12/2022   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   Osteomyelitis of left foot ([place]) 08/12/2022   Sepsis with organ dysfunction ([place]) 08/12/2022   Severe obstructive sleep apnea 08/12/2022   Acute prostatitis 08/09/2022   Urinary tract infection without hematuria 08/09/2022   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 diabetes mellitus, with fat layer exposed ([place]) 09/07/2021   Diabetes mellitus with hyperglycemia, without long-term current use of insulin ([place]) 05/23/2016   Hypercholesteremia 05/23/2016   Morbid obesity ([place]) 05/23/2016    Orientation RESPIRATION BLADDER Height & Weight     Self, Time, Situation, Place  Other (Comment) (CPAP at night) Continent Weight: (!) 172.5  kg Height:  6\' 4"  (193 cm)  BEHAVIORAL SYMPTOMS/MOOD NEUROLOGICAL BOWEL NUTRITION STATUS      Continent Diet (carb modified)  AMBULATORY STATUS COMMUNICATION OF NEEDS Skin   Extensive Assist Verbally Other (Comment), Surgical wounds (dry flaky skin; multple areas of stage 2 presure injuries; surgical incision to left foot s/p toe amputation; see note to facility)                       Personal Care Assistance Level of Assistance  Bathing, Feeding, Dressing Bathing Assistance: Maximum assistance Feeding assistance: Independent Dressing Assistance: Maximum assistance     Functional Limitations Info  Sight, Hearing, Speech Sight Info: Adequate Hearing Info: Adequate Speech Info: Adequate    SPECIAL CARE FACTORS FREQUENCY  PT (By licensed PT), OT (By licensed OT)     PT Frequency: 5x/week OT Frequency: 5x/week            Contractures Contractures Info: Not present    Additional Factors Info  Code Status, Allergies, Insulin Sliding Scale Code Status Info: Full Allergies Info: Diltiazem Hcl   Insulin Sliding Scale Info: See MAR       Current Medications (08/28/2022):  This is the current hospital active medication list Current Facility-Administered Medications  Medication Dose Route Frequency Provider Last Rate Last Admin   acetaminophen (TYLENOL) tablet 650 mg  650 mg Oral Q6H PRN Croitoru, Mihai, MD   650 mg at 08/28/22 1238   Or   acetaminophen (TYLENOL) suppository 650 mg  650 mg Rectal Q6H PRN Croitoru, Mihai, MD       bisacodyl (DULCOLAX) suppository 10 mg  10 mg Rectal Daily PRN Croitoru,  Dani Gobble, MD       ceFAZolin (ANCEF) IVPB 2g/100 mL premix  2 g Intravenous Q8H Croitoru, Mihai, MD 200 mL/hr at 08/28/22 1405 2 g at 08/28/22 1405   Chlorhexidine Gluconate Cloth 2 % PADS 6 each  6 each Topical Daily Croitoru, Mihai, MD   6 each at 08/28/22 1003   fentaNYL (SUBLIMAZE) injection 50 mcg  50 mcg Intravenous Q2H PRN Croitoru, Mihai, MD   50 mcg at 08/25/22 1027    insulin aspart (novoLOG) injection 0-15 Units  0-15 Units Subcutaneous TID WC Croitoru, Mihai, MD   3 Units at 08/28/22 1749   insulin aspart (novoLOG) injection 3 Units  3 Units Subcutaneous TID WC Croitoru, Mihai, MD   3 Units at 08/28/22 1749   insulin glargine-yfgn (SEMGLEE) injection 18 Units  18 Units Subcutaneous QHS Croitoru, Mihai, MD   18 Units at 08/27/22 2204   multivitamin with minerals tablet 1 tablet  1 tablet Oral Daily Croitoru, Mihai, MD   1 tablet at 08/28/22 0957   naloxone (NARCAN) injection 0.4 mg  0.4 mg Intravenous PRN Croitoru, Mihai, MD       oxyCODONE (Oxy IR/ROXICODONE) immediate release tablet 10 mg  10 mg Oral Q4H PRN Croitoru, Mihai, MD   10 mg at 08/28/22 1759   polyethylene glycol (MIRALAX / GLYCOLAX) packet 17 g  17 g Oral Daily Shelly Coss, MD   17 g at 08/28/22 1403   protein supplement (ENSURE MAX) liquid  11 oz Oral BID Croitoru, Mihai, MD   11 oz at 08/28/22 1005   senna-docusate (Senokot-S) tablet 2 tablet  2 tablet Oral BID Croitoru, Mihai, MD   2 tablet at 08/28/22 0957   sodium chloride (PF) 0.9 % injection            sodium chloride flush (NS) 0.9 % injection 10-40 mL  10-40 mL Intracatheter Q12H Croitoru, Mihai, MD   10 mL at 08/27/22 2203   sodium chloride flush (NS) 0.9 % injection 10-40 mL  10-40 mL Intracatheter PRN Croitoru, Mihai, MD       sodium chloride flush (NS) 0.9 % injection 5 mL  5 mL Intracatheter Q8H Croitoru, Mihai, MD   5 mL at 08/28/22 1405   sodium chloride irrigation 0.9 % 3,000 mL  3,000 mL Irrigation Continuous Croitoru, Mihai, MD   3,000 mL at 08/19/22 0553   tamsulosin (FLOMAX) capsule 0.4 mg  0.4 mg Oral Daily Croitoru, Mihai, MD   0.4 mg at 08/28/22 0865     Discharge Medications: Please see discharge summary for a list of discharge medications.  Relevant Imaging Results:  Relevant Lab Results:   Additional Information SSN [id number]; 172.5 kg; 6\' 4" ; will need IV antibiotics after d/c;  single lumen PICC placed  08/23/22; JP drain LLQ placed 08/22/22  Henrietta Dine, RN

## 2022-08-29 ENCOUNTER — Inpatient Hospital Stay (HOSPITAL_COMMUNITY): Payer: Commercial Managed Care - PPO

## 2022-08-29 ENCOUNTER — Inpatient Hospital Stay: Payer: Self-pay

## 2022-08-29 DIAGNOSIS — B9561 Methicillin susceptible Staphylococcus aureus infection as the cause of diseases classified elsewhere: Secondary | ICD-10-CM | POA: Diagnosis not present

## 2022-08-29 DIAGNOSIS — R7881 Bacteremia: Secondary | ICD-10-CM | POA: Diagnosis not present

## 2022-08-29 LAB — BASIC METABOLIC PANEL
Anion gap: 7 (ref 5–15)
BUN: 18 mg/dL (ref 6–20)
CO2: 27 mmol/L (ref 22–32)
Calcium: 8.3 mg/dL — ABNORMAL LOW (ref 8.9–10.3)
Chloride: 99 mmol/L (ref 98–111)
Creatinine, Ser: 0.74 mg/dL (ref 0.61–1.24)
GFR, Estimated: >60 mL/min (ref >60)
Glucose, Bld: 179 mg/dL — ABNORMAL HIGH (ref 70–99)
Potassium: 3.9 mmol/L (ref 3.5–5.1)
Sodium: 133 mmol/L — ABNORMAL LOW (ref 135–145)

## 2022-08-29 LAB — CBC
HCT: 33.1 % — ABNORMAL LOW (ref 39.0–52.0)
Hemoglobin: 10.6 g/dL — ABNORMAL LOW (ref 13.0–17.0)
MCH: 26.7 pg (ref 26.0–34.0)
MCHC: 32 g/dL (ref 30.0–36.0)
MCV: 83.4 fL (ref 80.0–100.0)
Platelets: 206 10*3/uL (ref 150–400)
RBC: 3.97 MIL/uL — ABNORMAL LOW (ref 4.22–5.81)
RDW: 15.4 % (ref 11.5–15.5)
WBC: 10.7 10*3/uL — ABNORMAL HIGH (ref 4.0–10.5)
nRBC: 0 % (ref 0.0–0.2)

## 2022-08-29 LAB — GLUCOSE, CAPILLARY
Glucose-Capillary: 174 mg/dL — ABNORMAL HIGH (ref 70–99)
Glucose-Capillary: 151 mg/dL — ABNORMAL HIGH (ref 70–99)
Glucose-Capillary: 189 mg/dL — ABNORMAL HIGH (ref 70–99)
Glucose-Capillary: 159 mg/dL — ABNORMAL HIGH (ref 70–99)

## 2022-08-29 NOTE — Progress Notes (Signed)
Peripherally Inserted Central Catheter Exchange  The IV Nurse has discussed with the patient and/or persons authorized to consent for the patient, the purpose of this procedure and the potential benefits and risks involved with this procedure.  The benefits include less needle sticks, lab draws from the catheter, and the patient may be discharged home with the catheter. Risks include, but not limited to, infection, bleeding, blood clot (thrombus formation), and puncture of an artery; nerve damage and irregular heartbeat and possibility to perform a PICC exchange if needed/ordered by physician.  Alternatives to this procedure were also discussed.  Bard Power PICC patient education guide, fact sheet on infection prevention and patient information card has been provided to patient /or left at bedside.    PICC Placement Documentation  PICC Single Lumen 94/07/68 Left Basilic 51 cm 0 cm (Active)  Indication for Insertion or Continuance of Line Home intravenous therapies (PICC only) 08/29/22 1927  Exposed Catheter (cm) 0 cm 08/29/22 1927  Site Assessment Clean, Dry, Intact 08/29/22 1927  Line Status Flushed;Saline locked;Blood return noted 08/29/22 1927  Dressing Type Transparent;Securing device 08/29/22 1927  Dressing Status Antimicrobial disc in place 08/29/22 1927  Safety Lock Not Applicable 08/81/10 3159  Line Care Connections checked and tightened 08/29/22 1927  Line Adjustment (NICU/IV Team Only) No 08/29/22 1927  Dressing Intervention New dressing 08/29/22 1927  Dressing Change Due 09/05/22 08/29/22 1927       [place] 08/29/2022, 7:28 PM

## 2022-08-29 NOTE — TOC Progression Note (Signed)
Transition of Care [facility]) - Progression Note    Patient Details  Name: William [name] MRN: [id number] Date of Birth: 03/14/1961  [facility] CM/SW Contact  Angelita Ingles, RN Phone Number:[phone]  08/29/2022, 11:24 AM  Clinical Narrative:    Currently no bed offers. CM has expanded bed search.    Expected Discharge Plan: [place] Barriers to Discharge: Continued Medical Work up  Expected Discharge Plan and Services   Discharge Planning Services: CM Consult   Living arrangements for the past 2 months: Single Family Home                                       Social Determinants of Health (SDOH) Interventions SDOH Screenings   Food Insecurity: No Food Insecurity (08/28/2022)  Housing: Low Risk  (08/28/2022)  Transportation Needs: No Transportation Needs (08/28/2022)  Utilities: Not At Risk (08/28/2022)  Tobacco Use: Low Risk  (08/27/2022)    Readmission Risk Interventions     No data to display

## 2022-08-29 NOTE — Progress Notes (Signed)
Physical Therapy Treatment Patient Details Name: William [name] MRN: [id number] DOB: [birth year] Today's Date: 08/29/2022   History of Present Illness Patient is 62 yo male admitted 08/16/22  for evaluation of persistent left-sided low back pain and pain along the suprapubic region , treated for dysuria recently, S/P PROSTATE ABSCESS UNROOFING, TRANSURETHRAL RESECTION OF THE PROSTATE. PMH: right great toe amputation,DM,HTN.previous history:On 08/12/2022 the patient was admitted to the outside hospital with DKA a and A-fib with RVR.  He was complaining of weakness shortness of breath.  He was noted to be hypoxic and his blood sugar was over 500.  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  Further evaluation with CT scan subsequently demonstrated possible prostatic abscess and psoas abscess on the left.    PT Comments     Assisted patient into recliner with Tenor lift. Patient was appreciative of being  OOB  in a recliner. Left patient seated  with legs dependent. Patient  PT will assist patient back to bed with lift.  Recommendations for follow up therapy are one component of a multi-disciplinary discharge planning process, led by the attending physician.  Recommendations may be updated based on patient status, additional functional criteria and insurance authorization.  Follow Up Recommendations  Acute inpatient rehab (3hours/day)if any facility can accept Can patient physically be transported by private vehicle: No   Assistance Recommended at Discharge Frequent or constant Supervision/Assistance  Patient can return home with the following Two people to help with walking and/or transfers;A lot of help with bathing/dressing/bathroom;Assist for transportation;Help with stairs or ramp for entrance;Assistance with cooking/housework;Direct supervision/assist for medications management   Equipment Recommendations  None  recommended by PT (will require Wc eventually)    Recommendations for Other Services Rehab consult     Precautions / Restrictions Precautions Precautions: Fall Precaution Comments: , bariatric, profound weakness of legs, ? due to abcesses in liiopsoas L > R, recent gr. toe amp on  Left, Left JP drain Splint/Cast: post op shoe for left, crocs     Mobility  Bed Mobility Overal bed mobility: Needs Assistance Bed Mobility: Rolling Rolling: Max assist, +2 for safety/equipment, +2 for physical assistance         General bed mobility comments: requires assistance  to move each leg  to facilitate rolling,  Lift pad placed under patient.    Transfers                   General transfer comment: use of tenor klift  for OOB to recliner.    Ambulation/Gait                   Stairs             Wheelchair Mobility    Modified Rankin (Stroke Patients Only)       Balance       Sitting balance - Comments: patient did attempt to lean  forward in the recliner but only tolerated sitting semireclined in recliner                                    Cognition Arousal/Alertness: Awake/alert Behavior During Therapy: Anxious                                   General  Comments: anxious when in lift        Exercises      General Comments        Pertinent Vitals/Pain Pain Assessment Faces Pain Scale: Hurts little more Pain Location: L back and R hip - with transitions Pain Descriptors / Indicators: Grimacing, Guarding, Discomfort Pain Intervention(s): Monitored during session, Premedicated before session    Home Living                          Prior Function            PT Goals (current goals can now be found in the care plan section) Progress towards PT goals: Progressing toward goals    Frequency    Min 3X/week      PT Plan Current plan remains appropriate    Co-evaluation               AM-PAC PT "6 Clicks" Mobility   Outcome Measure    Help needed moving from lying on your back to sitting on the side of a flat bed without using bedrails?: A Lot Help needed moving to and from a bed to a chair (including a wheelchair)?: Total Help needed standing up from a chair using your arms (e.g., wheelchair or bedside chair)?: Total Help needed to walk in hospital room?: Total Help needed climbing 3-5 steps with a railing? : Total 6 Click Score: 6    End of Session   Activity Tolerance: Patient tolerated treatment well Patient left: in chair;with call bell/phone within reach;with family/visitor present Nurse Communication: Mobility status;Need for lift equipment PT Visit Diagnosis: Unsteadiness on feet (R26.81);Difficulty in walking, not elsewhere classified (R26.2);Pain Pain - part of body: Ankle and joints of foot     Time: 1230-1300 PT Time Calculation (min) (ACUTE ONLY): 30 min  Charges:  $Therapeutic Activity: 23-37 mins                     [place] Office [phone] Weekend [phone]    Claretha Cooper 08/29/2022, 4:20 PM

## 2022-08-29 NOTE — Plan of Care (Signed)
Patient AOX4, VSS throughout shift. All meds given on time as ordered. Pt c/o pain relieved by PRN oxycodone. Diminished lungs, IS taught and encouraged. Left foot dressing intact and new purewick placed. Sacrum mepliex intact. Pt had one BM last night.  POC maintained, will continue to monitor.   Problem: Education: Goal: Knowledge of General Education information will improve Description: Including pain rating scale, medication(s)/side effects and non-pharmacologic comfort measures 08/29/2022 0403 by Marjory Sneddon, RN Outcome: Progressing 08/29/2022 0403 by Marjory Sneddon, RN Outcome: Progressing   Problem: Health Behavior/Discharge Planning: Goal: Ability to manage health-related needs will improve 08/29/2022 0403 by Marjory Sneddon, RN Outcome: Progressing 08/29/2022 0403 by Marjory Sneddon, RN Outcome: Progressing   Problem: Clinical Measurements: Goal: Ability to maintain clinical measurements within normal limits will improve 08/29/2022 0403 by Marjory Sneddon, RN Outcome: Progressing 08/29/2022 0403 by Marjory Sneddon, RN Outcome: Progressing Goal: Will remain free from infection Outcome: Progressing Goal: Diagnostic test results will improve Outcome: Progressing Goal: Respiratory complications will improve Outcome: Progressing Goal: Cardiovascular complication will be avoided Outcome: Progressing   Problem: Activity: Goal: Risk for activity intolerance will decrease Outcome: Progressing   Problem: Nutrition: Goal: Adequate nutrition will be maintained Outcome: Progressing   Problem: Coping: Goal: Level of anxiety will decrease Outcome: Progressing   Problem: Elimination: Goal: Will not experience complications related to bowel motility Outcome: Progressing Goal: Will not experience complications related to urinary retention Outcome: Progressing   Problem: Pain Managment: Goal: General experience of comfort will improve Outcome: Progressing   Problem: Safety: Goal:  Ability to remain free from injury will improve Outcome: Progressing   Problem: Skin Integrity: Goal: Risk for impaired skin integrity will decrease Outcome: Progressing   Problem: Education: Goal: Ability to describe self-care measures that may prevent or decrease complications (Diabetes Survival Skills Education) will improve Outcome: Progressing Goal: Individualized Educational Video(s) Outcome: Progressing   Problem: Coping: Goal: Ability to adjust to condition or change in health will improve Outcome: Progressing   Problem: Fluid Volume: Goal: Ability to maintain a balanced intake and output will improve Outcome: Progressing   Problem: Health Behavior/Discharge Planning: Goal: Ability to identify and utilize available resources and services will improve Outcome: Progressing Goal: Ability to manage health-related needs will improve Outcome: Progressing   Problem: Metabolic: Goal: Ability to maintain appropriate glucose levels will improve Outcome: Progressing   Problem: Nutritional: Goal: Maintenance of adequate nutrition will improve Outcome: Progressing Goal: Progress toward achieving an optimal weight will improve Outcome: Progressing   Problem: Skin Integrity: Goal: Risk for impaired skin integrity will decrease Outcome: Progressing   Problem: Tissue Perfusion: Goal: Adequacy of tissue perfusion will improve Outcome: Progressing

## 2022-08-29 NOTE — Progress Notes (Addendum)
PROGRESS NOTE  William [name]  HER:[id number] DOB: 12/20/1960 DOA: 08/16/2022 PCP: Pcp, No   Brief Narrative: Patient is a 62 year old male with history of diabetes type 2, hypertension, osteomyelitis of left great toe status post amputation, hyperlipidemia, severe OSA, atrial fibrillation not on anticoagulation who was transferred here from [facility] after he presented with left flank pain in the setting of  prostatic abscess.  He was recently diagnosed with prostatitis and was treated with antibiotic for MSSA bacteremia.  Urology, IR consulted on admission.  ID also was following.  Plan for 6 weeks of antibiotics.  PT/OT recommending SNF.  Medically stable for discharge whenever possible.  Assessment & Plan:  Principal Problem:   MSSA bacteremia Active Problems:   Prostatic abscess   Possible psoas muscle infection   Osteomyelitis of left foot (HCC)   Hyperthyroidism   Hypercholesteremia   Morbid obesity (HCC)   Normocytic anemia   Paroxysmal atrial fibrillation (HCC)   Severe obstructive sleep apnea   Type 2 diabetes mellitus (HCC)   Hypokalemia   Hyponatremia   Malnutrition of moderate degree   MSSA bacteremia: Patient had positive blood culture at [facility] emergency department for MSSA.  Blood culture drawn here on 1/13 was negative till date.  Source thought to be secondary to ulcer on the foot with dissemination to the prostate, bilateral psoas muscles.  TEE negative for vegetation.  ID following, plan for 6 weeks of antibiotics. Currently on cefazolin.  PICC line placed on 1/19.  Bilateral psoas fluid collection: CT abdomen/pelvis showed bilateral iliopsoas fluid collection, right-sided collection slightly larger . IR consulted and he underwent CT-guided aspiration, drainage of fluid, drain placement of left psoas abscess.  Continue pain management, supportive care. CT abdomen/pelvis with contrast done on 1/24  for follow-up showed persistent but decreasing size of fluid  collection in the left psoas muscle. ,no significant  change in the right-sided psoas abscess compared to 08/21/2022.  IR following,needs outpatient follow up.  Prostatic abscess: Status post TURP, posterior abscess unroofing on 1/14 by Dr. [name].  Foley discontinued  Left foot osteomyelitis: Status post left great toe partial amputation on  1/11 at [facility].  As per podiatry, continue dressing changes daily with Xeroform, wrapped in gauze, covered in Ace wrap.  Weightbearing as tolerated in postop shoe.  After 2 weeks, around Jan 25, stop Xeroform dressing and use Adaptic.  Leave sutures in place for 1 month, removal in February 11.  Follow-up with Dr. [name] in [place] in a month. Wound care consulted  Hyperthyroidism: The TSH is normal.  Follow-up with endocrinology as an outpatient  Type 2 diabetes: Uncontrolled.  A1c of more than 12.  Continue current insulin regimen.  OSA: On CPAP at night  Paroxysmal A-fib:New diagnosis at this  admission.  CHA2DS2-Vasc 1.    Anticoagulation deferred as per above.  Currently in normal sinus rhythm  Obesity: BMI of 47  Nutrition Problem: Moderate Malnutrition Etiology: acute illness (osteomyelitis) Pressure Injury 08/25/22 Buttocks Medial;Mid Stage 2 -  Partial thickness loss of dermis presenting as a shallow open injury with a red, pink wound bed without slough. Circular, pink (Active)  08/25/22 1400  Location: Buttocks  Location Orientation: Medial;Mid  Staging: Stage 2 -  Partial thickness loss of dermis presenting as a shallow open injury with a red, pink wound bed without slough.  Wound Description (Comments): Circular, pink  Present on Admission:   Dressing Type Foam - Lift dressing to assess site every shift 08/28/22 2000  Pressure Injury 08/25/22 Ischial tuberosity Right Stage 2 -  Partial thickness loss of dermis presenting as a shallow open injury with a red, pink wound bed without slough. Circular, pink (Active)  08/25/22  1400  Location: Ischial tuberosity  Location Orientation: Right  Staging: Stage 2 -  Partial thickness loss of dermis presenting as a shallow open injury with a red, pink wound bed without slough.  Wound Description (Comments): Circular, pink  Present on Admission:   Dressing Type Foam - Lift dressing to assess site every shift 08/28/22 2000     Pressure Injury 08/25/22 Buttocks Right Stage 2 -  Partial thickness loss of dermis presenting as a shallow open injury with a red, pink wound bed without slough. Circula, pink (Active)  08/25/22 1400  Location: Buttocks  Location Orientation: Right  Staging: Stage 2 -  Partial thickness loss of dermis presenting as a shallow open injury with a red, pink wound bed without slough.  Wound Description (Comments): Circula, pink  Present on Admission:   Dressing Type Foam - Lift dressing to assess site every shift 08/28/22 2000    DVT prophylaxis:SCDs Start: 08/17/22 0026     Code Status: Full Code  Family Communication: None at bedside  Patient status:Inpatient  Patient is from :Home  Anticipated discharge to:SNF  Estimated DC date:whenever possible,waiting for bed   Consultants: ID,urology,IR  Procedures:drain placement  Antimicrobials:  Anti-infectives (From admission, onward)    Start     Dose/Rate Route Frequency Ordered Stop   08/18/22 2200  ceFAZolin (ANCEF) IVPB 2g/100 mL premix        2 g 200 mL/hr over 30 Minutes Intravenous Every 8 hours 08/18/22 1712     08/18/22 0200  cefTRIAXone (ROCEPHIN) 2 g in sodium chloride 0.9 % 100 mL IVPB  Status:  Discontinued        2 g 200 mL/hr over 30 Minutes Intravenous Every 24 hours 08/17/22 1304 08/18/22 1712   08/17/22 1400  vancomycin (VANCOREADY) IVPB 1750 mg/350 mL  Status:  Discontinued        1,750 mg 175 mL/hr over 120 Minutes Intravenous Every 12 hours 08/17/22 0049 08/18/22 1712   08/17/22 0130  cefTRIAXone (ROCEPHIN) 1 g in sodium chloride 0.9 % 100 mL IVPB  Status:   Discontinued        1 g 200 mL/hr over 30 Minutes Intravenous Every 24 hours 08/17/22 0039 08/17/22 1304   08/17/22 0130  vancomycin (VANCOREADY) IVPB 2000 mg/400 mL        2,000 mg 200 mL/hr over 120 Minutes Intravenous  Once 08/17/22 0042 08/17/22 0418       Subjective: Patient seen and examined at bedside today.  Hemodynamically stable without any new complaints.  Comfortable  Objective: Vitals:   08/28/22 0540 08/28/22 1312 08/28/22 2100 08/29/22 0658  BP: (!) 122/97 (!) 185/160 (!) 151/86 (!) 122/52  Pulse: 100 (!) 45 99 98  Resp: 18 18 15 16   Temp: 98.2 F (36.8 C) 98.8 F (37.1 C) 99 F (37.2 C) 97.8 F (36.6 C)  TempSrc: Oral Oral Oral Oral  SpO2: 92% 100% 98% 96%  Weight:    (!) 168.9 kg  Height:        Intake/Output Summary (Last 24 hours) at 08/29/2022 1110 Last data filed at 08/29/2022 0700 Gross per 24 hour  Intake 1085.28 ml  Output 1200 ml  Net -114.72 ml   Filed Weights   08/25/22 0540 08/26/22 0500 08/29/22 0658  Weight: (!) 177.1 kg 08/31/22)  172.5 kg (!) 168.9 kg    Examination: General exam: Overall comfortable, not in distress, morbidly obese HEENT: PERRL Respiratory system:  no wheezes or crackles  Cardiovascular system: S1 & S2 heard, RRR.  Gastrointestinal system: Abdomen is nondistended, soft and nontender. Central nervous system: Alert and oriented Extremities: No edema, no clubbing ,no cyanosis dressing on the left foot, PICC line on the left arm Skin: No rashes, no ulcers,no icterus     Data Reviewed: I have personally reviewed following labs and imaging studies  CBC: Recent Labs  Lab 08/23/22 0556 08/24/22 0628 08/26/22 0500 08/29/22 0840  WBC 12.2* 12.7* 11.2* 10.7*  HGB 10.8* 10.4* 10.2* 10.6*  HCT 34.3* 32.1* 32.1* 33.1*  MCV 82.3 83.8 83.8 83.4  PLT 330 317 277 885   Basic Metabolic Panel: Recent Labs  Lab 08/23/22 0556 08/25/22 0539 08/26/22 0500 08/29/22 0840  NA 131* 134* 131* 133*  K 3.5 3.4* 4.0 3.9  CL 95* 97*  98 99  CO2 28 29 28 27   GLUCOSE 340* 175* 206* 179*  BUN 17 15 18 18   CREATININE 0.78 0.75 0.70 0.74  CALCIUM 7.6* 7.8* 8.0* 8.3*     Recent Results (from the past 240 hour(s))  Culture, blood (Routine X 2) w Reflex to ID Panel     Status: None   Collection Time: 08/22/22  1:03 PM   Specimen: BLOOD LEFT HAND  Result Value Ref Range Status   Specimen Description   Final    BLOOD LEFT HAND Performed at [facility], [address], [place], [place] [address]    Special Requests   Final    BOTTLES DRAWN AEROBIC AND ANAEROBIC Blood Culture adequate volume Performed at [facility] [address], [place], [place] [address]    Culture   Final    NO GROWTH 5 DAYS Performed at [facility], [place] [address], [place], [place] [address]    Report Status 08/27/2022 FINAL  Final  Culture, blood (Routine X 2) w Reflex to ID Panel     Status: None   Collection Time: 08/22/22  1:09 PM   Specimen: BLOOD  Result Value Ref Range Status   Specimen Description   Final    BLOOD BLOOD RIGHT HAND AEROBIC BOTTLE ONLY ANAEROBIC BOTTLE ONLY Performed at [facility] [address], [place], [place] [address]    Special Requests   Final    BOTTLES DRAWN AEROBIC AND ANAEROBIC Blood Culture adequate volume Performed at [facility] [address] [place], [place] [address]    Culture   Final    NO GROWTH 5 DAYS Performed at [place] [facility], [place] [address] [place], [place] [address]    Report Status 08/27/2022 FINAL  Final  Aerobic/Anaerobic Culture w Gram Stain (surgical/deep wound)     Status: None   Collection Time: 08/22/22  5:38 PM   Specimen: Abscess  Result Value Ref Range Status   Specimen Description   Final    ABSCESS Performed at [facility] [address], [place], [place] [address]    Special Requests LEFT PSOAS  Final   Gram Stain   Final    ABUNDANT WBC PRESENT, PREDOMINANTLY  PMN MODERATE GRAM POSITIVE COCCI IN PAIRS    Culture   Final    FEW STAPHYLOCOCCUS AUREUS NO ANAEROBES ISOLATED Performed at [facility], [place] [address], [place], [place] [address]    Report Status  08/27/2022 FINAL  Final   Organism ID, Bacteria STAPHYLOCOCCUS AUREUS  Final      Susceptibility   Staphylococcus aureus - MIC*    CIPROFLOXACIN <=0.5 SENSITIVE Sensitive     ERYTHROMYCIN <=0.25 SENSITIVE Sensitive     GENTAMICIN <=0.5 SENSITIVE Sensitive     OXACILLIN <=0.25 SENSITIVE Sensitive     TETRACYCLINE <=1 SENSITIVE Sensitive     VANCOMYCIN 1 SENSITIVE Sensitive     TRIMETH/SULFA <=10 SENSITIVE Sensitive     CLINDAMYCIN <=0.25 SENSITIVE Sensitive     RIFAMPIN <=0.5 SENSITIVE Sensitive     Inducible Clindamycin NEGATIVE Sensitive     * FEW STAPHYLOCOCCUS AUREUS     Radiology Studies: CT ABDOMEN PELVIS W CONTRAST  Result Date: 08/28/2022 CLINICAL DATA:  Evaluate bilateral psoas muscle abscesses EXAM: CT ABDOMEN AND PELVIS WITH CONTRAST TECHNIQUE: Multidetector CT imaging of the abdomen and pelvis was performed using the standard protocol following bolus administration of intravenous contrast. RADIATION DOSE REDUCTION: This exam was performed according to the departmental dose-optimization program which includes automated exposure control, adjustment of the mA and/or kV according to patient size and/or use of iterative reconstruction technique. CONTRAST:  122mL OMNIPAQUE IOHEXOL 300 MG/ML  SOLN COMPARISON:  CT 08/21/2022, 08/16/2022, 08/06/2022 FINDINGS: Lower chest: No acute abnormality. Hepatobiliary: No focal liver abnormality is seen. No gallstones, gallbladder wall thickening, or biliary dilatation. Pancreas: Pancreatic atrophy without ductal dilatation or inflammatory changes. Spleen: Normal in size without focal abnormality. Adrenals/Urinary Tract: Unchanged renal gland thickening without well-defined nodule. Kidneys enhance symmetrically. No renal lesion, stone, or  hydronephrosis. Small bubble of air within the urinary bladder, presumably related to prior instrumentation. Previously seen Foley catheter has been removed. Stomach/Bowel: Stomach is within normal limits. Appendix appears normal. No evidence of bowel wall thickening, distention, or inflammatory changes. Large volume of stool throughout the colon. Vascular/Lymphatic: No significant vascular findings are present. No enlarged abdominal or pelvic lymph nodes. Reproductive: Continued improvement in the appearance of the prostate gland following surgical unroofing. Small areas of ill-defined low attenuation within the prostate apex, more prominent on the left (series 2, image 96). No rim enhancement is evident. Other: Interval placement of a percutaneous pigtail drainage catheter within the left psoas collection. Persistent but decreasing size of fluid collection in the left psoas muscle. Small foci of air are now present within the collection, likely related to recent drain placement. The right-sided psoas abscess is not significantly changed in size compared to 08/21/2022 (series 2, image 65). No gas within the collection. No ascites. No new fluid collections. No pneumoperitoneum. Musculoskeletal: No new or acute bony findings. Degenerative disc disease at L3-4 and L4-5 has not appreciably changed. IMPRESSION: 1. Interval placement of a percutaneous pigtail drainage catheter within the left psoas collection. Persistent but decreasing size of fluid collection in the left psoas muscle. 2. The right-sided psoas abscess is not significantly changed in size compared to 08/21/2022. 3. Continued improvement in the appearance of the prostate gland following surgical unroofing. Small areas of ill-defined low attenuation within the prostate apex, more prominent on the left. 4. Large volume of stool throughout the colon. Electronically Signed   By: Davina Poke D.O.   On: 08/28/2022 10:01   ECHO TEE  Result Date:  08/27/2022    TRANSESOPHOGEAL ECHO REPORT   Patient Name:   William [name] Date of Exam: 08/27/2022 Medical Rec #:  [id number]        Height:       76.0 in Accession #:    [id number]  Weight:       380.3 lb Date of Birth:  1961-03-22         BSA:          2.911 m Patient Age:    61 years         BP:           132/88 mmHg Patient Gender: M                HR:           85 bpm. Exam Location:  Inpatient Procedure: Transesophageal Echo, Cardiac Doppler and Color Doppler Indications:     Bacteremia  History:         Patient has no prior history of Echocardiogram examinations.                  Abnormal ECG, Arrythmias:Atrial Fibrillation,                  Signs/Symptoms:Bacteremia; Risk Factors:Dyslipidemia and                  Diabetes.  Sonographer:     Sheralyn Boatman RDCS Referring Phys:  [facility] Diagnosing Phys: Thurmon Fair MD PROCEDURE: After discussion of the risks and benefits of a TEE, an informed consent was obtained from the patient. The transesophogeal probe was passed without difficulty through the esophogus of the patient. Imaged were obtained with the patient in a left lateral decubitus and supine position. Sedation performed by different physician. The patient was monitored while under deep sedation. Anesthestetic sedation was provided intravenously by Anesthesiology: 260mg  of Propofol. The patient's vital signs;  including heart rate, blood pressure, and oxygen saturation; remained stable throughout the procedure. The patient developed no complications during the procedure.  IMPRESSIONS  1. Left ventricular ejection fraction, by estimation, is 60 to 65%. The left ventricle has normal function. The left ventricle has no regional wall motion abnormalities. There is mild concentric left ventricular hypertrophy.  2. Right ventricular systolic function is normal. The right ventricular size is normal.  3. No left atrial/left atrial appendage thrombus was detected.  4. The mitral valve is normal in structure.  Trivial mitral valve regurgitation. No evidence of mitral stenosis.  5. The aortic valve is normal in structure. Aortic valve regurgitation is not visualized. No aortic stenosis is present.  6. The inferior vena cava is normal in size with greater than 50% respiratory variability, suggesting right atrial pressure of 3 mmHg. Conclusion(s)/Recommendation(s): Normal biventricular function without evidence of hemodynamically significant valvular heart disease. FINDINGS  Left Ventricle: Left ventricular ejection fraction, by estimation, is 60 to 65%. The left ventricle has normal function. The left ventricle has no regional wall motion abnormalities. The left ventricular internal cavity size was normal in size. There is  mild concentric left ventricular hypertrophy. Right Ventricle: The right ventricular size is normal. No increase in right ventricular wall thickness. Right ventricular systolic function is normal. Left Atrium: Left atrial size was normal in size. No left atrial/left atrial appendage thrombus was detected. Right Atrium: Right atrial size was normal in size. Pericardium: There is no evidence of pericardial effusion. Mitral Valve: The mitral valve is normal in structure. Trivial mitral valve regurgitation. No evidence of mitral valve stenosis. Tricuspid Valve: The tricuspid valve is normal in structure. Tricuspid valve regurgitation is not demonstrated. No evidence of tricuspid stenosis. Aortic Valve: The aortic valve is normal in structure. Aortic valve regurgitation is not visualized. No aortic stenosis is present. Pulmonic Valve:  The pulmonic valve was normal in structure. Pulmonic valve regurgitation is not visualized. No evidence of pulmonic stenosis. Aorta: The aortic root is normal in size and structure. Venous: The inferior vena cava is normal in size with greater than 50% respiratory variability, suggesting right atrial pressure of 3 mmHg. IAS/Shunts: No atrial level shunt detected by color flow  Doppler. Additional Comments: Spectral Doppler performed. Rachelle Hora Croitoru MD Electronically signed by Thurmon Fair MD Signature Date/Time: 08/27/2022/3:12:17 PM    Final     Scheduled Meds:  Chlorhexidine Gluconate Cloth  6 each Topical Daily   insulin aspart  0-15 Units Subcutaneous TID WC   insulin aspart  3 Units Subcutaneous TID WC   insulin glargine-yfgn  18 Units Subcutaneous QHS   multivitamin with minerals  1 tablet Oral Daily   polyethylene glycol  17 g Oral Daily   Ensure Max Protein  11 oz Oral BID   senna-docusate  2 tablet Oral BID   sodium chloride flush  10-40 mL Intracatheter Q12H   sodium chloride flush  5 mL Intracatheter Q8H   tamsulosin  0.4 mg Oral Daily   Continuous Infusions:   ceFAZolin (ANCEF) IV 2 g (08/29/22 0552)   sodium chloride irrigation       LOS: 13 days   Burnadette Pop, MD [facility] P1/25/2024, 11:10 AM

## 2022-08-29 NOTE — Consult Note (Signed)
[facility] Nurse Consult Note: Reason for Consult:Left great toe amputation site. Sutures intact.  New orders to begin today.  Stop Xeroform.  Begin adaptic.  Sutures to be removed at February follow up surgery appointment Wound type: surgical Pressure Injury POA: NA Measurement: photo in chart  Sutures in place Wound bed: none Drainage (amount, consistency, odor) minimal serosanguinous  no odor Periwound: edema Dressing procedure/placement/frequency: Bedside RN to perform and teach family/patient as needed for discharge. Cleanse left toe amputation site with NS and pat dry. Apply adaptic to suture line for atraumatic dressing removal.  Cover with gauze and kerlix/tape  change daily.  Will not follow at this time.  Please re-consult if needed.  Estrellita Ludwig MSN, RN, FNP-BC CWON Wound, Ostomy, Continence Nurse [facility] [phone] Pager [phone]

## 2022-08-29 NOTE — Progress Notes (Signed)
Physical Therapy Treatment Patient Details Name: William [name] MRN: [id number] DOB: 1961/07/14 Today's Date: 08/29/2022   History of Present Illness Patient is 62 yo male admitted 08/16/22  for evaluation of persistent left-sided low back pain and pain along the suprapubic region , treated for dysuria recently, S/P PROSTATE ABSCESS UNROOFING, TRANSURETHRAL RESECTION OF THE PROSTATE. PMH: right great toe amputation,DM,HTN.previous history:On 08/12/2022 the patient was admitted to the outside hospital with DKA a and A-fib with RVR.  He was complaining of weakness shortness of breath.  He was noted to be hypoxic and his blood sugar was over 500.  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  Further evaluation with CT scan subsequently demonstrated possible prostatic abscess and psoas abscess on the left.    PT Comments    The patient has been in recliner x 1.25 hours and is very uncomfortable . Assisted back to bed via tenor lift. Assisted with  repositioning in bed. Continue  with tilt bed and OOB activities as tolerated.   Recommendations for follow up therapy are one component of a multi-disciplinary discharge planning process, led by the attending physician.  Recommendations may be updated based on patient status, additional functional criteria and insurance authorization.  Follow Up Recommendations  Acute inpatient rehab (3hours/day) Can patient physically be transported by private vehicle: No   Assistance Recommended at Discharge Frequent or constant Supervision/Assistance  Patient can return home with the following Two people to help with walking and/or transfers;A lot of help with bathing/dressing/bathroom;Assist for transportation;Help with stairs or ramp for entrance;Assistance with cooking/housework;Direct supervision/assist for medications management   Equipment Recommendations  None recommended by PT     Recommendations for Other Services Rehab consult     Precautions / Restrictions Precautions Precautions: Fall Precaution Comments: , bariatric, profound weakness of legs, ? due to abcesses in liiopsoas L > R, recent gr. toe amp on  Left, Left JP drain Required Braces or Orthoses: Splint/Cast Splint/Cast: post op shoe for left, crocs Restrictions LLE Weight Bearing: Weight bearing as tolerated     Mobility  Bed Mobility Overal bed mobility: Needs Assistance Bed Mobility: Rolling Rolling: Max assist, +2 for safety/equipment, +2 for physical assistance         General bed mobility comments: assisted back to bed with lift, rolls to each side for  pad to be removed with max assistance.    Transfers                   General transfer comment: tenor  used to return to bed.    Ambulation/Gait                   Stairs             Wheelchair Mobility    Modified Rankin (Stroke Patients Only)       Balance       Sitting balance - NT                                 Cognition Arousal/Alertness: Awake/alert Behavior During Therapy: Anxious Overall Cognitive Status: Within Functional Limits for tasks assessed                                 General Comments: anxious when in lift  Exercises      General Comments        Pertinent Vitals/Pain Pain Assessment Faces Pain Scale: Hurts whole lot Pain Location: abdomen after sitting in recloner Pain Descriptors / Indicators: Grimacing, Guarding, Discomfort Pain Intervention(s): Monitored during session, Patient requesting pain meds-RN notified    Home Living                          Prior Function            PT Goals (current goals can now be found in the care plan section) Progress towards PT goals: Progressing toward goals    Frequency    Min 3X/week      PT Plan Current plan remains appropriate    Co-evaluation               AM-PAC PT "6 Clicks" Mobility   Outcome Measure  Help needed turning from your back to your side while in a flat bed without using bedrails?: A Lot Help needed moving from lying on your back to sitting on the side of a flat bed without using bedrails?: A Lot Help needed moving to and from a bed to a chair (including a wheelchair)?: Total Help needed standing up from a chair using your arms (e.g., wheelchair or bedside chair)?: Total Help needed to walk in hospital room?: Total Help needed climbing 3-5 steps with a railing? : Total 6 Click Score: 8    End of Session Equipment Utilized During Treatment:  (tenor) Activity Tolerance: Patient tolerated treatment well Patient left: in bed;with call bell/phone within reach;with family/visitor present Nurse Communication: Mobility status;Need for lift equipment PT Visit Diagnosis: Unsteadiness on feet (R26.81);Difficulty in walking, not elsewhere classified (R26.2);Pain Pain - part of body: Ankle and joints of foot     Time: 0932-6712 PT Time Calculation (min) (ACUTE ONLY): 27 min  Charges:  $Therapeutic Activity: 23-37 mins                    [place] Office [phone] Weekend [phone]   Claretha Cooper 08/29/2022, 4:26 PM

## 2022-08-30 DIAGNOSIS — B9561 Methicillin susceptible Staphylococcus aureus infection as the cause of diseases classified elsewhere: Secondary | ICD-10-CM | POA: Diagnosis not present

## 2022-08-30 DIAGNOSIS — R7881 Bacteremia: Secondary | ICD-10-CM | POA: Diagnosis not present

## 2022-08-30 LAB — GLUCOSE, CAPILLARY
Glucose-Capillary: 155 mg/dL — ABNORMAL HIGH (ref 70–99)
Glucose-Capillary: 147 mg/dL — ABNORMAL HIGH (ref 70–99)
Glucose-Capillary: 174 mg/dL — ABNORMAL HIGH (ref 70–99)
Glucose-Capillary: 166 mg/dL — ABNORMAL HIGH (ref 70–99)
Glucose-Capillary: 181 mg/dL — ABNORMAL HIGH (ref 70–99)
Glucose-Capillary: 151 mg/dL — ABNORMAL HIGH (ref 70–99)

## 2022-08-30 MED ORDER — ORAL CARE MOUTH RINSE: 15.0000 mL | OROMUCOSAL | Status: DC | PRN

## 2022-08-30 NOTE — Progress Notes (Signed)
Physical Therapy Treatment Patient Details Name: William [name] MRN: [id number] DOB: 01-Jul-1961 Today's Date: 08/30/2022   History of Present Illness Patient is 62 yo male admitted 08/16/22  for evaluation of persistent left-sided low back pain and pain along the suprapubic region , treated for dysuria recently, S/P PROSTATE ABSCESS UNROOFING, TRANSURETHRAL RESECTION OF THE PROSTATE. PMH: right great toe amputation,DM,HTN.previous history:On 08/12/2022 the patient was admitted to the outside hospital with DKA a and A-fib with RVR.  He was complaining of weakness shortness of breath.  He was noted to be hypoxic and his blood sugar was over 500.  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  Further evaluation with CT scan subsequently demonstrated possible prostatic abscess and psoas abscess on the left.    PT Comments    The  patient participated in bed mobility, assisted to sitting onto bed edge with +2 max assistance. Placed  bari recliner in front for patient support. Patient  slowly able to  work on seated balance with no  UE support, intermittently having to  support with weight shifts.  Patient  did  scoot towards  [facility] with momentum x 4 and max assistance.  Patient continues to demonstrate very weak LE's especially quads with poor  control  for  placing legs when seated.   Recommend PT focus on tilting and WB/strengthening of LE's and functional sitting. Recommend nursing use the TENO lift equipment for OOB to recliner.  Recommendations for follow up therapy are one component of a multi-disciplinary discharge planning process, led by the attending physician.  Recommendations may be updated based on patient status, additional functional criteria and insurance authorization.  Follow Up Recommendations  Skilled nursing-short term rehab (<3 hours/day)     Assistance Recommended at Discharge Frequent or constant  Supervision/Assistance  Patient can return home with the following Two people to help with walking and/or transfers;A lot of help with bathing/dressing/bathroom;Assist for transportation;Help with stairs or ramp for entrance;Assistance with cooking/housework;Direct supervision/assist for medications management   Equipment Recommendations  None recommended by PT    Recommendations for Other Services       Precautions / Restrictions Precautions Precautions: Fall Precaution Comments: , bariatric, profound weakness of legs, ? due to abcesses in liiopsoas L > R, recent gr. toe amp on  Left, Left JP drain Splint/Cast: post op shoe for left, crocs Restrictions LLE Weight Bearing: Weight bearing as tolerated     Mobility  Bed Mobility   Bed Mobility: Rolling Rolling: Max assist, +2 for safety/equipment, +2 for physical assistance   Supine to sit: +2 for safety/equipment, +2 for physical assistance, Total assist Sit to supine: Total assist, +2 for physical assistance, +2 for safety/equipment   General bed mobility comments: patient using rails to assist with rolling and sitting, requires max assistance for legs to bed edge and  replace legs onto bed.    Transfers                        Ambulation/Gait                   Stairs             Wheelchair Mobility    Modified Rankin (Stroke Patients Only)       Balance Overall balance assessment: Needs assistance Sitting-balance support: Bilateral upper extremity supported, Feet supported Sitting balance-[name] Scale: Poor Sitting balance - Comments: use  of chair in front for balance                                    Cognition Arousal/Alertness: Awake/alert Behavior During Therapy: Anxious Overall Cognitive Status: Within Functional Limits for tasks assessed                                 General Comments: anxious when sitting  on EOB until more confident in balance         Exercises      General Comments        Pertinent Vitals/Pain Pain Assessment Faces Pain Scale: Hurts even more Pain Location: abdomen after sitting Pain Descriptors / Indicators: Grimacing, Guarding, Discomfort Pain Intervention(s): Patient requesting pain meds-RN notified    Home Living                          Prior Function            PT Goals (current goals can now be found in the care plan section) Progress towards PT goals: Progressing toward goals    Frequency    Min 3X/week      PT Plan Current plan remains appropriate    Co-evaluation PT/OT/SLP Co-Evaluation/Treatment: Yes Reason for Co-Treatment: Complexity of the patient's impairments (multi-system involvement);For patient/therapist safety;To address functional/ADL transfers PT goals addressed during session: Mobility/safety with mobility OT goals addressed during session: ADL's and self-care      AM-PAC PT "6 Clicks" Mobility   Outcome Measure  Help needed turning from your back to your side while in a flat bed without using bedrails?: A Lot Help needed moving from lying on your back to sitting on the side of a flat bed without using bedrails?: A Lot Help needed moving to and from a bed to a chair (including a wheelchair)?: Total Help needed standing up from a chair using your arms (e.g., wheelchair or bedside chair)?: Total Help needed to walk in hospital room?: Total Help needed climbing 3-5 steps with a railing? : Total 6 Click Score: 8    End of Session   Activity Tolerance: Patient tolerated treatment well Patient left: in bed;with call bell/phone within reach;with family/visitor present Nurse Communication: Mobility status;Need for lift equipment (TENOR) PT Visit Diagnosis: Unsteadiness on feet (R26.81);Difficulty in walking, not elsewhere classified (R26.2);Pain Pain - Right/Left: Left     Time: 7425-9563 PT Time Calculation (min) (ACUTE ONLY): 42 min  Charges:   $Therapeutic Activity: 23-37 mins                     [place] Office [phone] Weekend [phone]    Claretha Cooper 08/30/2022, 12:16 PM

## 2022-08-30 NOTE — Progress Notes (Signed)
Occupational Therapy Treatment Patient Details Name: William [name] MRN: [id number] DOB: 12-15-1960 Today's Date: 08/30/2022   History of present illness Patient is 62 yo male admitted 08/16/22  for evaluation of persistent left-sided low back pain and pain along the suprapubic region , treated for dysuria recently, S/P PROSTATE ABSCESS UNROOFING, TRANSURETHRAL RESECTION OF THE PROSTATE. PMH: right great toe amputation,DM,HTN.previous history:On 08/12/2022 the patient was admitted to the outside hospital with DKA a and A-fib with RVR.  He was complaining of weakness shortness of breath.  He was noted to be hypoxic and his blood sugar was over 500.  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  Further evaluation with CT scan subsequently demonstrated possible prostatic abscess and psoas abscess on the left.   OT comments  Patient able to sit edge of bed today x 15 minutes to work on core strength and sitting balance. Able to scoot towards head of bed with max assist as well. Total assist for pericare at bed level due to being found to be incontinent in sitting. He attempted to help but ended up getting BM on his hands so therapist asked him to desist. Patient is very eager to work with therapy and get out of bed. Continue to recommend aggressive rehab to work on strengthening, functional abilities and compensatory strategies.    Recommendations for follow up therapy are one component of a multi-disciplinary discharge planning process, led by the attending physician.  Recommendations may be updated based on patient status, additional functional criteria and insurance authorization.    Follow Up Recommendations  Acute inpatient rehab (3hours/day)     Assistance Recommended at Discharge Frequent or constant Supervision/Assistance  Patient can return home with the following  Two people to help with walking and/or transfers;Two  people to help with bathing/dressing/bathroom;Assistance with cooking/housework;Help with stairs or ramp for entrance;Assist for transportation   Equipment Recommendations  Other (comment) (TBD)    Recommendations for Other Services      Precautions / Restrictions Precautions Precautions: Fall Precaution Comments: , bariatric, profound weakness of legs, ? due to abcesses in liiopsoas L > R, recent gr. toe amp on  Left, Left JP drain Required Braces or Orthoses: Splint/Cast Splint/Cast: post op shoe for left, crocs Restrictions Weight Bearing Restrictions: No LLE Weight Bearing: Weight bearing as tolerated Other Position/Activity Restrictions: in [place] shoe (has in room)       Mobility Bed Mobility Overal bed mobility: Needs Assistance Bed Mobility: Rolling Rolling: Mod assist, +2 for physical assistance   Supine to sit: +2 for safety/equipment, +2 for physical assistance, Max assist Sit to supine: +2 for physical assistance, +2 for safety/equipment, Max assist   General bed mobility comments: patient using rails to assist with rolling and sitting, requires max assistance for legs to bed edge and  replace legs onto bed.    Transfers                         Balance Overall balance assessment: Needs assistance Sitting-balance support: Feet supported, Bilateral upper extremity supported   Sitting balance - Comments: use of chair in front for balance                                   ADL either performed or assessed with clinical judgement   ADL Overall ADL's : Needs  assistance/impaired                             Toileting- Water quality scientist and Hygiene: Total assistance;Bed level Toileting - Clothing Manipulation Details (indicate cue type and reason): Patient found to be incontinent of BM once sitting at EOB. Returned to supine for pericare. Patient mod assist to manage LEs to roll. TOtal care for pericare.       General ADL  Comments: Working on sitting balance at edge of bed. Working towards not needing UE support. He was able to put his hands on his knees or lift one hand up. 15 minutes at EOB.    Extremity/Trunk Assessment Upper Extremity Assessment Upper Extremity Assessment: Overall WFL for tasks assessed   Lower Extremity Assessment Lower Extremity Assessment: Defer to PT evaluation   Cervical / Trunk Assessment Cervical / Trunk Assessment: Normal    Vision   Vision Assessment?: No apparent visual deficits   Perception     Praxis      Cognition Arousal/Alertness: Awake/alert Behavior During Therapy: Anxious Overall Cognitive Status: Within Functional Limits for tasks assessed                                          Exercises Other Exercises Other Exercises: Core strengthening tasks at edge of bed.    Shoulder Instructions       General Comments      Pertinent Vitals/ Pain       Pain Assessment Pain Assessment: No/denies pain  Home Living                                          Prior Functioning/Environment              Frequency  Min 2X/week        Progress Toward Goals  OT Goals(current goals can now be found in the care plan section)  Progress towards OT goals: Progressing toward goals  Acute Rehab OT Goals Patient Stated Goal: to get stronger and get stronger OT Goal Formulation: With patient Time For Goal Achievement: 09/03/22 Potential to Achieve Goals: Good  Plan Discharge plan remains appropriate    Co-evaluation    PT/OT/SLP Co-Evaluation/Treatment: Yes Reason for Co-Treatment: Complexity of the patient's impairments (multi-system involvement);For patient/therapist safety;To address functional/ADL transfers PT goals addressed during session: Mobility/safety with mobility OT goals addressed during session: ADL's and self-care;Strengthening/ROM      AM-PAC OT "6 Clicks" Daily Activity     Outcome Measure    Help from another person eating meals?: None Help from another person taking care of personal grooming?: A Little Help from another person toileting, which includes using toliet, bedpan, or urinal?: Total Help from another person bathing (including washing, rinsing, drying)?: A Lot Help from another person to put on and taking off regular upper body clothing?: A Little Help from another person to put on and taking off regular lower body clothing?: Total 6 Click Score: 14    End of Session    OT Visit Diagnosis: Other abnormalities of gait and mobility (R26.89);Muscle weakness (generalized) (M62.81);Pain Pain - Right/Left: Left Pain - part of body: Ankle and joints of foot   Activity Tolerance Patient tolerated treatment well   Patient Left in  bed;with call bell/phone within reach   Nurse Communication Mobility status        Time: 1610-9604 OT Time Calculation (min): 36 min  Charges: OT General Charges $OT Visit: 1 Visit OT Treatments $Self Care/Home Management : 8-22 mins  Donnella Sham, OTR/L Acute Care Rehab Services  Office [phone]   Kelli Churn 08/30/2022, 1:24 PM

## 2022-08-30 NOTE — Progress Notes (Signed)
Referring Physician(s): Lama,G  Supervising Physician: Richarda Overlie  Patient Status:  [facility] - In-pt  Chief Complaint:  Back pain, prostate/psoas abscesses    Subjective: Pt currently without new c/o   Allergies: Diltiazem hcl  Medications: Prior to Admission medications   Medication Sig Start Date End Date Taking? Authorizing Provider  albuterol (ACCUNEB) 1.25 MG/3ML nebulizer solution Take 1 ampule by nebulization every 6 (six) hours as needed for shortness of breath or wheezing. 07/21/22  Yes [provider]  meloxicam (MOBIC) 7.5 MG tablet Take 1 tablet (7.5 mg total) by mouth daily. 08/09/22  Yes Stoneking, Danford Bad., MD  oxyCODONE (OXY IR/ROXICODONE) 5 MG immediate release tablet Take 1 tablet (5 mg total) by mouth every 6 (six) hours as needed for severe pain. 08/09/22  Yes Stoneking, Danford Bad., MD  tamsulosin (FLOMAX) 0.4 MG CAPS capsule Take 1 capsule (0.4 mg total) by mouth daily. 08/09/22  Yes Stoneking, Danford Bad., MD     Vital Signs: BP 102/62 (BP Location: Right Arm)   Pulse (!) 116   Temp 97.7 F (36.5 C) (Oral)   Resp 18   Ht 6\' 4"  (1.93 m)   Wt (!) 374 lb 12.5 oz (170 kg)   SpO2 92%   BMI 45.62 kg/m   Physical Exam awake/alert; LLQ/psoas drain intact, insertion site ok, NT, OP 15 cc, drain flushed but minimal return  Imaging: DG CHEST PORT 1 VIEW  Result Date: 08/29/2022 CLINICAL DATA:  PICC line placement EXAM: PORTABLE CHEST 1 VIEW COMPARISON:  None Available. FINDINGS: Right costophrenic angle excluded from view. Extreme lung apices excluded from view. Visualized lungs are clear. No definite pneumothorax or pleural effusion. Cardiac size within normal limits. Pulmonary vascularity is normal. Left upper extremity PICC line tip seen within the superior vena cava. No acute bone abnormality. IMPRESSION: 1. Left upper extremity PICC line tip within the superior vena cava. Electronically Signed   By: 08/31/2022 M.D.   On: 08/29/2022 20:41   08/31/2022  EKG SITE RITE  Result Date: 08/29/2022 If Site Rite image not attached, placement could not be confirmed due to current cardiac rhythm.  08/31/2022 EKG SITE RITE  Result Date: 08/29/2022 If Site Rite image not attached, placement could not be confirmed due to current cardiac rhythm.  DG CHEST PORT 1 VIEW  Result Date: 08/29/2022 CLINICAL DATA:  PICC line placement. EXAM: PORTABLE CHEST 1 VIEW COMPARISON:  Radiographs 08/19/2022 and 08/13/2022. FINDINGS: 1453 hours. Three views are submitted. The central portion of a left arm PICC is not visualized beyond the level of the trachea, near the confluence of the brachiocephalic veins. The heart size and mediastinal contours are stable. The aeration of the lung bases has improved from the prior study. No evidence of pleural effusion or pneumothorax. No acute osseous findings. IMPRESSION: The central portion of the left arm PICC is not well visualized and may terminate near the confluence of the brachiocephalic veins. Improved pulmonary aeration. Electronically Signed   By: 10/12/2022 M.D.   On: 08/29/2022 15:52   CT ABDOMEN PELVIS W CONTRAST  Result Date: 08/28/2022 CLINICAL DATA:  Evaluate bilateral psoas muscle abscesses EXAM: CT ABDOMEN AND PELVIS WITH CONTRAST TECHNIQUE: Multidetector CT imaging of the abdomen and pelvis was performed using the standard protocol following bolus administration of intravenous contrast. RADIATION DOSE REDUCTION: This exam was performed according to the departmental dose-optimization program which includes automated exposure control, adjustment of the mA and/or kV according to patient size and/or use of iterative  reconstruction technique. CONTRAST:  138mL OMNIPAQUE IOHEXOL 300 MG/ML  SOLN COMPARISON:  CT 08/21/2022, 08/16/2022, 08/06/2022 FINDINGS: Lower chest: No acute abnormality. Hepatobiliary: No focal liver abnormality is seen. No gallstones, gallbladder wall thickening, or biliary dilatation. Pancreas: Pancreatic atrophy  without ductal dilatation or inflammatory changes. Spleen: Normal in size without focal abnormality. Adrenals/Urinary Tract: Unchanged renal gland thickening without well-defined nodule. Kidneys enhance symmetrically. No renal lesion, stone, or hydronephrosis. Small bubble of air within the urinary bladder, presumably related to prior instrumentation. Previously seen Foley catheter has been removed. Stomach/Bowel: Stomach is within normal limits. Appendix appears normal. No evidence of bowel wall thickening, distention, or inflammatory changes. Large volume of stool throughout the colon. Vascular/Lymphatic: No significant vascular findings are present. No enlarged abdominal or pelvic lymph nodes. Reproductive: Continued improvement in the appearance of the prostate gland following surgical unroofing. Small areas of ill-defined low attenuation within the prostate apex, more prominent on the left (series 2, image 96). No rim enhancement is evident. Other: Interval placement of a percutaneous pigtail drainage catheter within the left psoas collection. Persistent but decreasing size of fluid collection in the left psoas muscle. Small foci of air are now present within the collection, likely related to recent drain placement. The right-sided psoas abscess is not significantly changed in size compared to 08/21/2022 (series 2, image 65). No gas within the collection. No ascites. No new fluid collections. No pneumoperitoneum. Musculoskeletal: No new or acute bony findings. Degenerative disc disease at L3-4 and L4-5 has not appreciably changed. IMPRESSION: 1. Interval placement of a percutaneous pigtail drainage catheter within the left psoas collection. Persistent but decreasing size of fluid collection in the left psoas muscle. 2. The right-sided psoas abscess is not significantly changed in size compared to 08/21/2022. 3. Continued improvement in the appearance of the prostate gland following surgical unroofing. Small  areas of ill-defined low attenuation within the prostate apex, more prominent on the left. 4. Large volume of stool throughout the colon. Electronically Signed   By: Davina Poke D.O.   On: 08/28/2022 10:01   ECHO TEE  Result Date: 08/27/2022    TRANSESOPHOGEAL ECHO REPORT   Patient Name:   William [name] Date of Exam: 08/27/2022 Medical Rec #:  [id number]        Height:       76.0 in Accession #:    [id number]       Weight:       380.3 lb Date of Birth:  11-05-1960         BSA:          2.911 m Patient Age:    62 years         BP:           132/88 mmHg Patient Gender: M                HR:           85 bpm. Exam Location:  Inpatient Procedure: Transesophageal Echo, Cardiac Doppler and Color Doppler Indications:     Bacteremia  History:         Patient has no prior history of Echocardiogram examinations.                  Abnormal ECG, Arrythmias:Atrial Fibrillation,                  Signs/Symptoms:Bacteremia; Risk Factors:Dyslipidemia and                  Diabetes.  Sonographer:     Sheralyn Boatman RDCS Referring Phys:  [facility] Diagnosing Phys: Thurmon Fair MD PROCEDURE: After discussion of the risks and benefits of a TEE, an informed consent was obtained from the patient. The transesophogeal probe was passed without difficulty through the esophogus of the patient. Imaged were obtained with the patient in a left lateral decubitus and supine position. Sedation performed by different physician. The patient was monitored while under deep sedation. Anesthestetic sedation was provided intravenously by Anesthesiology: 260mg  of Propofol. The patient's vital signs;  including heart rate, blood pressure, and oxygen saturation; remained stable throughout the procedure. The patient developed no complications during the procedure.  IMPRESSIONS  1. Left ventricular ejection fraction, by estimation, is 60 to 65%. The left ventricle has normal function. The left ventricle has no regional wall motion abnormalities. There is  mild concentric left ventricular hypertrophy.  2. Right ventricular systolic function is normal. The right ventricular size is normal.  3. No left atrial/left atrial appendage thrombus was detected.  4. The mitral valve is normal in structure. Trivial mitral valve regurgitation. No evidence of mitral stenosis.  5. The aortic valve is normal in structure. Aortic valve regurgitation is not visualized. No aortic stenosis is present.  6. The inferior vena cava is normal in size with greater than 50% respiratory variability, suggesting right atrial pressure of 3 mmHg. Conclusion(s)/Recommendation(s): Normal biventricular function without evidence of hemodynamically significant valvular heart disease. FINDINGS  Left Ventricle: Left ventricular ejection fraction, by estimation, is 60 to 65%. The left ventricle has normal function. The left ventricle has no regional wall motion abnormalities. The left ventricular internal cavity size was normal in size. There is  mild concentric left ventricular hypertrophy. Right Ventricle: The right ventricular size is normal. No increase in right ventricular wall thickness. Right ventricular systolic function is normal. Left Atrium: Left atrial size was normal in size. No left atrial/left atrial appendage thrombus was detected. Right Atrium: Right atrial size was normal in size. Pericardium: There is no evidence of pericardial effusion. Mitral Valve: The mitral valve is normal in structure. Trivial mitral valve regurgitation. No evidence of mitral valve stenosis. Tricuspid Valve: The tricuspid valve is normal in structure. Tricuspid valve regurgitation is not demonstrated. No evidence of tricuspid stenosis. Aortic Valve: The aortic valve is normal in structure. Aortic valve regurgitation is not visualized. No aortic stenosis is present. Pulmonic Valve: The pulmonic valve was normal in structure. Pulmonic valve regurgitation is not visualized. No evidence of pulmonic stenosis. Aorta: The  aortic root is normal in size and structure. Venous: The inferior vena cava is normal in size with greater than 50% respiratory variability, suggesting right atrial pressure of 3 mmHg. IAS/Shunts: No atrial level shunt detected by color flow Doppler. Additional Comments: Spectral Doppler performed. MD Electronically signed by Thurmon Fair MD Signature Date/Time: 08/27/2022/3:12:17 PM    Final     Labs:  CBC: Recent Labs    08/23/22 0556 08/24/22 0628 08/26/22 0500 08/29/22 0840  WBC 12.2* 12.7* 11.2* 10.7*  HGB 10.8* 10.4* 10.2* 10.6*  HCT 34.3* 32.1* 32.1* 33.1*  PLT 330 317 277 206    COAGS: Recent Labs    08/17/22 0115  INR 1.4*    BMP: Recent Labs    08/23/22 0556 08/25/22 0539 08/26/22 0500 08/29/22 0840  NA 131* 134* 131* 133*  K 3.5 3.4* 4.0 3.9  CL 95* 97* 98 99  CO2 28 29 28 27   GLUCOSE 340* 175* 206* 179*  BUN  17 15 18 18   CALCIUM 7.6* 7.8* 8.0* 8.3*  CREATININE 0.78 0.75 0.70 0.74  GFRNONAA >60 >60 >60 >60    LIVER FUNCTION TESTS: Recent Labs    08/19/22 1025 08/20/22 0543 08/23/22 0556 08/26/22 0500  BILITOT 0.5 0.7 0.5 0.5  AST 28 29 31 25   ALT 30 26 24 24   ALKPHOS 352* 322* 281* 234*  PROT 5.6* 5.3* 6.3* 6.6  ALBUMIN 1.7* 1.7* 2.1* 2.4*    Assessment and Plan: Pt with hx prostate/bilat psoas abscesses; s/p right psoas collection asp/left psoas collection drain placement 1/18 (10 fr to JP) ; afebrile, no new labs;  drain fl creat level ok; cont current tx for now; f/u imaging next week   Electronically Signed: D. Rowe Robert, PA-C 08/30/2022, 2:31 PM   I spent a total of 15 Minutes at the the patient's bedside AND on the patient's hospital floor or unit, greater than 50% of which was counseling/coordinating care for left psoas abscess drain    Patient ID: William [name], male   DOB: Nov 16, 1960, 62 y.o.   MRN: [id number]

## 2022-08-30 NOTE — Progress Notes (Signed)
PROGRESS NOTE  William [name]  ZOX:[id number] DOB: 10-Jul-1961 DOA: 08/16/2022 PCP: Pcp, No   Brief Narrative: Patient is a 62 year old male with history of diabetes type 2, hypertension, osteomyelitis of left great toe status post amputation, hyperlipidemia, severe OSA, atrial fibrillation not on anticoagulation who was transferred here from [facility] after he presented with left flank pain in the setting of  prostatic abscess.  He was recently diagnosed with prostatitis and was treated with antibiotic for MSSA bacteremia.  Urology, IR consulted on admission.  ID also was following.  Plan for 6 weeks of antibiotics.  PT/OT recommending SNF/acute inpatient rehab.  Medically stable for discharge whenever possible.  Assessment & Plan:  Principal Problem:   MSSA bacteremia Active Problems:   Prostatic abscess   Possible psoas muscle infection   Osteomyelitis of left foot (HCC)   Hyperthyroidism   Hypercholesteremia   Morbid obesity (HCC)   Normocytic anemia   Paroxysmal atrial fibrillation (HCC)   Severe obstructive sleep apnea   Type 2 diabetes mellitus (HCC)   Hypokalemia   Hyponatremia   Malnutrition of moderate degree   MSSA bacteremia: Patient had positive blood culture at [facility] emergency department for MSSA.  Blood culture drawn here on 1/13 was negative till date.  Source thought to be secondary to ulcer on the foot with dissemination to the prostate, bilateral psoas muscles.  TEE negative for vegetation.  ID following, plan for 6 weeks of antibiotics. Currently on cefazolin.  PICC line placed on 1/19,exchanged on 1/25.  Bilateral psoas fluid collection: CT abdomen/pelvis showed bilateral iliopsoas fluid collection, right-sided collection slightly larger . IR consulted and he underwent CT-guided aspiration, drainage of fluid, drain placement of left psoas abscess.  Continue pain management, supportive care. CT abdomen/pelvis with contrast done on 1/24  for follow-up showed  persistent but decreasing size of fluid collection in the left psoas muscle. ,no significant  change in the right-sided psoas abscess compared to 08/21/2022.  IR following,needs outpatient follow up.  Prostatic abscess: Status post TURP, posterior abscess unroofing on 1/14 by Dr. Louis Meckel.  Foley discontinued  Left foot osteomyelitis: Status post left great toe partial amputation on  1/11 at [facility].  As per podiatry,leave sutures in place for 1 month, removal in February 11.  Follow-up with Dr. Rocco Serene in [place] in a month.Wound care was following,please follow recommendation  Hyperthyroidism: The TSH is normal.  Follow-up with endocrinology as an outpatient  Type 2 diabetes: Uncontrolled.  A1c of more than 12.  Continue current insulin regimen.  OSA: On CPAP at night  Paroxysmal A-fib:New diagnosis at this  admission.  CHA2DS2-Vasc 1.    Anticoagulation deferred as per above.  Currently in normal sinus rhythm  Obesity: BMI of 47  Nutrition Problem: Moderate Malnutrition Etiology: acute illness (osteomyelitis) Pressure Injury 08/25/22 Buttocks Medial;Mid Stage 2 -  Partial thickness loss of dermis presenting as a shallow open injury with a red, pink wound bed without slough. Circular, pink (Active)  08/25/22 1400  Location: Buttocks  Location Orientation: Medial;Mid  Staging: Stage 2 -  Partial thickness loss of dermis presenting as a shallow open injury with a red, pink wound bed without slough.  Wound Description (Comments): Circular, pink  Present on Admission:   Dressing Type Foam - Lift dressing to assess site every shift 08/29/22 1952     Pressure Injury 08/25/22 Ischial tuberosity Right Stage 2 -  Partial thickness loss of dermis presenting as a shallow open injury with a red, pink wound bed without  slough. Circular, pink (Active)  08/25/22 1400  Location: Ischial tuberosity  Location Orientation: Right  Staging: Stage 2 -  Partial thickness loss of dermis presenting as  a shallow open injury with a red, pink wound bed without slough.  Wound Description (Comments): Circular, pink  Present on Admission:   Dressing Type Foam - Lift dressing to assess site every shift 08/29/22 1952     Pressure Injury 08/25/22 Buttocks Right Stage 2 -  Partial thickness loss of dermis presenting as a shallow open injury with a red, pink wound bed without slough. Circula, pink (Active)  08/25/22 1400  Location: Buttocks  Location Orientation: Right  Staging: Stage 2 -  Partial thickness loss of dermis presenting as a shallow open injury with a red, pink wound bed without slough.  Wound Description (Comments): Circula, pink  Present on Admission:   Dressing Type Foam - Lift dressing to assess site every shift 08/29/22 1952    DVT prophylaxis:SCDs Start: 08/17/22 0026     Code Status: Full Code  Family Communication: wife at bedside  Patient status:Inpatient  Patient is from :Home  Anticipated discharge to:SNF/AIR  Estimated DC date:whenever possible,waiting for bed   Consultants: ID,urology,IR  Procedures:drain placement  Antimicrobials:  Anti-infectives (From admission, onward)    Start     Dose/Rate Route Frequency Ordered Stop   08/18/22 2200  ceFAZolin (ANCEF) IVPB 2g/100 mL premix        2 g 200 mL/hr over 30 Minutes Intravenous Every 8 hours 08/18/22 1712     08/18/22 0200  cefTRIAXone (ROCEPHIN) 2 g in sodium chloride 0.9 % 100 mL IVPB  Status:  Discontinued        2 g 200 mL/hr over 30 Minutes Intravenous Every 24 hours 08/17/22 1304 08/18/22 1712   08/17/22 1400  vancomycin (VANCOREADY) IVPB 1750 mg/350 mL  Status:  Discontinued        1,750 mg 175 mL/hr over 120 Minutes Intravenous Every 12 hours 08/17/22 0049 08/18/22 1712   08/17/22 0130  cefTRIAXone (ROCEPHIN) 1 g in sodium chloride 0.9 % 100 mL IVPB  Status:  Discontinued        1 g 200 mL/hr over 30 Minutes Intravenous Every 24 hours 08/17/22 0039 08/17/22 1304   08/17/22 0130  vancomycin  (VANCOREADY) IVPB 2000 mg/400 mL        2,000 mg 200 mL/hr over 120 Minutes Intravenous  Once 08/17/22 0042 08/17/22 0418       Subjective: Patient seen and examined at bedside today.  Hemodynamically stable without any complaints.  Lying in bed.  Wife at bedside.  Objective: Vitals:   08/29/22 1717 08/29/22 2135 08/30/22 0236 08/30/22 0641  BP: (!) 108/57 115/75 112/85 119/79  Pulse: (!) 113 99 99 89  Resp: 20 20 20 20   Temp: 98.2 F (36.8 C) 98.4 F (36.9 C) 98.4 F (36.9 C) 98.1 F (36.7 C)  TempSrc: Oral Oral Oral Oral  SpO2: 92% 95% 96% 96%  Weight:    (!) 170 kg  Height:        Intake/Output Summary (Last 24 hours) at 08/30/2022 1118 Last data filed at 08/30/2022 1025 Gross per 24 hour  Intake 495 ml  Output 1015 ml  Net -520 ml   Filed Weights   08/26/22 0500 08/29/22 0658 08/30/22 0641  Weight: (!) 172.5 kg (!) 168.9 kg (!) 170 kg    Examination:  General exam: Overall comfortable, not in distress, morbidly obese HEENT: PERRL Respiratory system:  no wheezes  or crackles  Cardiovascular system: S1 & S2 heard, RRR.  Gastrointestinal system: Abdomen is nondistended, soft and nontender. Central nervous system: Alert and oriented Extremities: No edema, no clubbing ,no cyanosis, PICC line on the left, dressing on the left foot Skin: No rashes, no ulcers,no icterus     Data Reviewed: I have personally reviewed following labs and imaging studies  CBC: Recent Labs  Lab 08/24/22 0628 08/26/22 0500 08/29/22 0840  WBC 12.7* 11.2* 10.7*  HGB 10.4* 10.2* 10.6*  HCT 32.1* 32.1* 33.1*  MCV 83.8 83.8 83.4  PLT 317 277 867   Basic Metabolic Panel: Recent Labs  Lab 08/25/22 0539 08/26/22 0500 08/29/22 0840  NA 134* 131* 133*  K 3.4* 4.0 3.9  CL 97* 98 99  CO2 29 28 27   GLUCOSE 175* 206* 179*  BUN 15 18 18   CREATININE 0.75 0.70 0.74  CALCIUM 7.8* 8.0* 8.3*     Recent Results (from the past 240 hour(s))  Culture, blood (Routine X 2) w Reflex to ID  Panel     Status: None   Collection Time: 08/22/22  1:03 PM   Specimen: BLOOD LEFT HAND  Result Value Ref Range Status   Specimen Description   Final    BLOOD LEFT HAND Performed at [facility], [address], [address] [place], [place] [address]    Special Requests   Final    BOTTLES DRAWN AEROBIC AND ANAEROBIC Blood Culture adequate volume Performed at [facility] [address] [place], [place] [address]    Culture   Final    NO GROWTH 5 DAYS Performed at [facility], [place] [address] [place], [place] [address]    Report Status 08/27/2022 FINAL  Final  Culture, blood (Routine X 2) w Reflex to ID Panel     Status: None   Collection Time: 08/22/22  1:09 PM   Specimen: BLOOD  Result Value Ref Range Status   Specimen Description   Final    BLOOD BLOOD RIGHT HAND AEROBIC BOTTLE ONLY ANAEROBIC BOTTLE ONLY Performed at [facility] [address] [place], [place] [address]    Special Requests   Final    BOTTLES DRAWN AEROBIC AND ANAEROBIC Blood Culture adequate volume Performed at [facility] [address] [place], [place] [address]    Culture   Final    NO GROWTH 5 DAYS Performed at [facility], [place] [address], [place], [place] [address]    Report Status 08/27/2022 FINAL  Final  Aerobic/Anaerobic Culture w Gram Stain (surgical/deep wound)     Status: None   Collection Time: 08/22/22  5:38 PM   Specimen: Abscess  Result Value Ref Range Status   Specimen Description   Final    ABSCESS Performed at [facility] [address] [place], [place] [address]    Special Requests LEFT PSOAS  Final   Gram Stain   Final    ABUNDANT WBC PRESENT, PREDOMINANTLY PMN MODERATE GRAM POSITIVE COCCI IN PAIRS    Culture   Final    FEW STAPHYLOCOCCUS AUREUS NO ANAEROBES ISOLATED Performed at [facility], [place] [address], [place], [place] [address]    Report Status 08/27/2022 FINAL  Final    Organism ID, Bacteria STAPHYLOCOCCUS AUREUS  Final      Susceptibility   Staphylococcus aureus - MIC*    CIPROFLOXACIN <=0.5 SENSITIVE Sensitive     ERYTHROMYCIN <=0.25 SENSITIVE Sensitive  GENTAMICIN <=0.5 SENSITIVE Sensitive     OXACILLIN <=0.25 SENSITIVE Sensitive     TETRACYCLINE <=1 SENSITIVE Sensitive     VANCOMYCIN 1 SENSITIVE Sensitive     TRIMETH/SULFA <=10 SENSITIVE Sensitive     CLINDAMYCIN <=0.25 SENSITIVE Sensitive     RIFAMPIN <=0.5 SENSITIVE Sensitive     Inducible Clindamycin NEGATIVE Sensitive     * FEW STAPHYLOCOCCUS AUREUS     Radiology Studies: DG CHEST PORT 1 VIEW  Result Date: 08/29/2022 CLINICAL DATA:  PICC line placement EXAM: PORTABLE CHEST 1 VIEW COMPARISON:  None Available. FINDINGS: Right costophrenic angle excluded from view. Extreme lung apices excluded from view. Visualized lungs are clear. No definite pneumothorax or pleural effusion. Cardiac size within normal limits. Pulmonary vascularity is normal. Left upper extremity PICC line tip seen within the superior vena cava. No acute bone abnormality. IMPRESSION: 1. Left upper extremity PICC line tip within the superior vena cava. Electronically Signed   By: Fidela Salisbury M.D.   On: 08/29/2022 20:41   [place] EKG SITE RITE  Result Date: 08/29/2022 If Site Rite image not attached, placement could not be confirmed due to current cardiac rhythm.  [place] EKG SITE RITE  Result Date: 08/29/2022 If Site Rite image not attached, placement could not be confirmed due to current cardiac rhythm.  DG CHEST PORT 1 VIEW  Result Date: 08/29/2022 CLINICAL DATA:  PICC line placement. EXAM: PORTABLE CHEST 1 VIEW COMPARISON:  Radiographs 08/19/2022 and 08/13/2022. FINDINGS: 1453 hours. Three views are submitted. The central portion of a left arm PICC is not visualized beyond the level of the trachea, near the confluence of the brachiocephalic veins. The heart size and mediastinal contours are stable. The aeration of the lung  bases has improved from the prior study. No evidence of pleural effusion or pneumothorax. No acute osseous findings. IMPRESSION: The central portion of the left arm PICC is not well visualized and may terminate near the confluence of the brachiocephalic veins. Improved pulmonary aeration. Electronically Signed   By: Richardean Sale M.D.   On: 08/29/2022 15:52    Scheduled Meds:  Chlorhexidine Gluconate Cloth  6 each Topical Daily   insulin aspart  0-15 Units Subcutaneous TID WC   insulin aspart  3 Units Subcutaneous TID WC   insulin glargine-yfgn  18 Units Subcutaneous QHS   multivitamin with minerals  1 tablet Oral Daily   polyethylene glycol  17 g Oral Daily   Ensure Max Protein  11 oz Oral BID   senna-docusate  2 tablet Oral BID   sodium chloride flush  10-40 mL Intracatheter Q12H   sodium chloride flush  5 mL Intracatheter Q8H   tamsulosin  0.4 mg Oral Daily   Continuous Infusions:   ceFAZolin (ANCEF) IV 2 g (08/30/22 0602)   sodium chloride irrigation       LOS: 14 days   Shelly Coss, MD [facility] P1/26/2024, 11:18 AM

## 2022-08-30 NOTE — TOC Progression Note (Addendum)
Transition of Care [facility]) - Progression Note    Patient Details  Name: William [name] MRN: [id number] Date of Birth: [birth year]  Transition of Care [facility]) CM/SW Contact  Angelita Ingles, RN Phone Number:[phone]  08/30/2022, 1:44 PM  Clinical Narrative:    Cm at bedside to discuss disposition with patient. CM has offered to reach out to Inpatient rehab in [place] . Patient does not want to go to [facility] and would like to accept bed offer at [facility]. Juliann Pulse at [facility] has been made aware. CM will proceed with [facility] placement. Juliann Pulse to follow up to determine who initiates insurance auth.   1500 Cm spoke with Kia admission Mudlogger at [facility]. Per Kia the facility can accept patient and will initiate insurance auth.    Expected Discharge Plan: [place] Barriers to Discharge: Continued Medical Work up  Expected Discharge Plan and Services   Discharge Planning Services: CM Consult   Living arrangements for the past 2 months: Single Family Home                                       Social Determinants of Health (SDOH) Interventions SDOH Screenings   Food Insecurity: No Food Insecurity (08/28/2022)  Housing: Low Risk  (08/28/2022)  Transportation Needs: No Transportation Needs (08/28/2022)  Utilities: Not At Risk (08/28/2022)  Tobacco Use: Low Risk  (08/27/2022)    Readmission Risk Interventions     No data to display

## 2022-08-31 DIAGNOSIS — B9561 Methicillin susceptible Staphylococcus aureus infection as the cause of diseases classified elsewhere: Secondary | ICD-10-CM | POA: Diagnosis not present

## 2022-08-31 DIAGNOSIS — R7881 Bacteremia: Secondary | ICD-10-CM | POA: Diagnosis not present

## 2022-08-31 LAB — GLUCOSE, CAPILLARY
Glucose-Capillary: 156 mg/dL — ABNORMAL HIGH (ref 70–99)
Glucose-Capillary: 177 mg/dL — ABNORMAL HIGH (ref 70–99)
Glucose-Capillary: 118 mg/dL — ABNORMAL HIGH (ref 70–99)
Glucose-Capillary: 167 mg/dL — ABNORMAL HIGH (ref 70–99)

## 2022-08-31 NOTE — Progress Notes (Signed)
PROGRESS NOTE  William [name]  ZOX:[id number] DOB: 10-Jul-1961 DOA: 08/16/2022 PCP: Pcp, No   Brief Narrative: Patient is a 62 year old male with history of diabetes type 2, hypertension, osteomyelitis of left great toe status post amputation, hyperlipidemia, severe OSA, atrial fibrillation not on anticoagulation who was transferred here from [facility] after he presented with left flank pain in the setting of  prostatic abscess.  He was recently diagnosed with prostatitis and was treated with antibiotic for MSSA bacteremia.  Urology, IR consulted on admission.  ID also was following.  Plan for 6 weeks of antibiotics.  PT/OT recommending SNF/acute inpatient rehab.  Medically stable for discharge whenever possible.  Assessment & Plan:  Principal Problem:   MSSA bacteremia Active Problems:   Prostatic abscess   Possible psoas muscle infection   Osteomyelitis of left foot (HCC)   Hyperthyroidism   Hypercholesteremia   Morbid obesity (HCC)   Normocytic anemia   Paroxysmal atrial fibrillation (HCC)   Severe obstructive sleep apnea   Type 2 diabetes mellitus (HCC)   Hypokalemia   Hyponatremia   Malnutrition of moderate degree   MSSA bacteremia: Patient had positive blood culture at [facility] emergency department for MSSA.  Blood culture drawn here on 1/13 was negative till date.  Source thought to be secondary to ulcer on the foot with dissemination to the prostate, bilateral psoas muscles.  TEE negative for vegetation.  ID following, plan for 6 weeks of antibiotics. Currently on cefazolin.  PICC line placed on 1/19,exchanged on 1/25.  Bilateral psoas fluid collection: CT abdomen/pelvis showed bilateral iliopsoas fluid collection, right-sided collection slightly larger . IR consulted and he underwent CT-guided aspiration, drainage of fluid, drain placement of left psoas abscess.  Continue pain management, supportive care. CT abdomen/pelvis with contrast done on 1/24  for follow-up showed  persistent but decreasing size of fluid collection in the left psoas muscle. ,no significant  change in the right-sided psoas abscess compared to 08/21/2022.  IR following,needs outpatient follow up.  Prostatic abscess: Status post TURP, posterior abscess unroofing on 1/14 by Dr. Louis Meckel.  Foley discontinued  Left foot osteomyelitis: Status post left great toe partial amputation on  1/11 at [facility].  As per podiatry,leave sutures in place for 1 month, removal in February 11.  Follow-up with Dr. Rocco Serene in [place] in a month.Wound care was following,please follow recommendation  Hyperthyroidism: The TSH is normal.  Follow-up with endocrinology as an outpatient  Type 2 diabetes: Uncontrolled.  A1c of more than 12.  Continue current insulin regimen.  OSA: On CPAP at night  Paroxysmal A-fib:New diagnosis at this  admission.  CHA2DS2-Vasc 1.    Anticoagulation deferred as per above.  Currently in normal sinus rhythm  Obesity: BMI of 47  Nutrition Problem: Moderate Malnutrition Etiology: acute illness (osteomyelitis) Pressure Injury 08/25/22 Buttocks Medial;Mid Stage 2 -  Partial thickness loss of dermis presenting as a shallow open injury with a red, pink wound bed without slough. Circular, pink (Active)  08/25/22 1400  Location: Buttocks  Location Orientation: Medial;Mid  Staging: Stage 2 -  Partial thickness loss of dermis presenting as a shallow open injury with a red, pink wound bed without slough.  Wound Description (Comments): Circular, pink  Present on Admission:   Dressing Type Foam - Lift dressing to assess site every shift 08/29/22 1952     Pressure Injury 08/25/22 Ischial tuberosity Right Stage 2 -  Partial thickness loss of dermis presenting as a shallow open injury with a red, pink wound bed without  slough. Circular, pink (Active)  08/25/22 1400  Location: Ischial tuberosity  Location Orientation: Right  Staging: Stage 2 -  Partial thickness loss of dermis presenting as  a shallow open injury with a red, pink wound bed without slough.  Wound Description (Comments): Circular, pink  Present on Admission:   Dressing Type Foam - Lift dressing to assess site every shift 08/29/22 1952     Pressure Injury 08/25/22 Buttocks Right Stage 2 -  Partial thickness loss of dermis presenting as a shallow open injury with a red, pink wound bed without slough. Circula, pink (Active)  08/25/22 1400  Location: Buttocks  Location Orientation: Right  Staging: Stage 2 -  Partial thickness loss of dermis presenting as a shallow open injury with a red, pink wound bed without slough.  Wound Description (Comments): Circula, pink  Present on Admission:   Dressing Type Foam - Lift dressing to assess site every shift 08/29/22 1952    DVT prophylaxis:SCDs Start: 08/17/22 0026     Code Status: Full Code  Family Communication: wife at bedside on 1/26  Patient status:Inpatient  Patient is from :Home  Anticipated discharge to:SNF/AIR  Estimated DC date:whenever possible,waiting for bed   Consultants: ID,urology,IR  Procedures:drain placement  Antimicrobials:  Anti-infectives (From admission, onward)    Start     Dose/Rate Route Frequency Ordered Stop   08/18/22 2200  ceFAZolin (ANCEF) IVPB 2g/100 mL premix        2 g 200 mL/hr over 30 Minutes Intravenous Every 8 hours 08/18/22 1712     08/18/22 0200  cefTRIAXone (ROCEPHIN) 2 g in sodium chloride 0.9 % 100 mL IVPB  Status:  Discontinued        2 g 200 mL/hr over 30 Minutes Intravenous Every 24 hours 08/17/22 1304 08/18/22 1712   08/17/22 1400  vancomycin (VANCOREADY) IVPB 1750 mg/350 mL  Status:  Discontinued        1,750 mg 175 mL/hr over 120 Minutes Intravenous Every 12 hours 08/17/22 0049 08/18/22 1712   08/17/22 0130  cefTRIAXone (ROCEPHIN) 1 g in sodium chloride 0.9 % 100 mL IVPB  Status:  Discontinued        1 g 200 mL/hr over 30 Minutes Intravenous Every 24 hours 08/17/22 0039 08/17/22 1304   08/17/22 0130   vancomycin (VANCOREADY) IVPB 2000 mg/400 mL        2,000 mg 200 mL/hr over 120 Minutes Intravenous  Once 08/17/22 0042 08/17/22 0418       Subjective: Patient seen and examined at bedside today.  Hemodynamically  stable without any complaints.  Objective: Vitals:   08/30/22 2144 08/31/22 0500 08/31/22 0619 08/31/22 0949  BP: (!) 140/81  137/84 (!) 142/91  Pulse: 65  96 100  Resp: 20  20 18   Temp: 98.4 F (36.9 C)  97.7 F (36.5 C) 98.7 F (37.1 C)  TempSrc: Oral  Oral Oral  SpO2: 97%  100% 96%  Weight:  (!) 170.2 kg    Height:        Intake/Output Summary (Last 24 hours) at 08/31/2022 1058 Last data filed at 08/31/2022 7371 Gross per 24 hour  Intake 590 ml  Output 1565 ml  Net -975 ml   Filed Weights   08/29/22 0658 08/30/22 0641 08/31/22 0500  Weight: (!) 168.9 kg (!) 170 kg (!) 170.2 kg    Examination:  General exam: Overall comfortable, not in distress,morbidly obese HEENT: PERRL Respiratory system:  no wheezes or crackles  Cardiovascular system: S1 & S2 heard,  RRR.  Gastrointestinal system: Abdomen is nondistended, soft and nontender. Central nervous system: Alert and oriented Extremities: No edema, no clubbing ,no cyanosis, dressing in the left foot, PICC line in the left arm Skin: No rashes, no ulcers,no icterus     Data Reviewed: I have personally reviewed following labs and imaging studies  CBC: Recent Labs  Lab 08/26/22 0500 08/29/22 0840  WBC 11.2* 10.7*  HGB 10.2* 10.6*  HCT 32.1* 33.1*  MCV 83.8 83.4  PLT 277 206   Basic Metabolic Panel: Recent Labs  Lab 08/25/22 0539 08/26/22 0500 08/29/22 0840  NA 134* 131* 133*  K 3.4* 4.0 3.9  CL 97* 98 99  CO2 29 28 27   GLUCOSE 175* 206* 179*  BUN 15 18 18   CREATININE 0.75 0.70 0.74  CALCIUM 7.8* 8.0* 8.3*     Recent Results (from the past 240 hour(s))  Culture, blood (Routine X 2) w Reflex to ID Panel     Status: None   Collection Time: 08/22/22  1:03 PM   Specimen: BLOOD LEFT HAND   Result Value Ref Range Status   Specimen Description   Final    BLOOD LEFT HAND Performed at [facility], [address], [place], [address] [place]    Special Requests   Final    BOTTLES DRAWN AEROBIC AND ANAEROBIC Blood Culture adequate volume Performed at [facility], [address] [place], [place]    Culture   Final    NO GROWTH 5 DAYS Performed at [facility], [address], [place], [address] [place]    Report Status 08/27/2022 FINAL  Final  Culture, blood (Routine X 2) w Reflex to ID Panel     Status: None   Collection Time: 08/22/22  1:09 PM   Specimen: BLOOD  Result Value Ref Range Status   Specimen Description   Final    BLOOD BLOOD RIGHT HAND AEROBIC BOTTLE ONLY ANAEROBIC BOTTLE ONLY Performed at [facility], [address], [place], [place]    Special Requests   Final    BOTTLES DRAWN AEROBIC AND ANAEROBIC Blood Culture adequate volume Performed at [facility], [address] [place], [place]    Culture   Final    NO GROWTH 5 DAYS Performed at [facility], [address] [place], [address] [place]    Report Status 08/27/2022 FINAL  Final  Aerobic/Anaerobic Culture w Gram Stain (surgical/deep wound)     Status: None   Collection Time: 08/22/22  5:38 PM   Specimen: Abscess  Result Value Ref Range Status   Specimen Description   Final    ABSCESS Performed at [facility], [address] [place], [place]    Special Requests LEFT PSOAS  Final   Gram Stain   Final    ABUNDANT WBC PRESENT, PREDOMINANTLY PMN MODERATE GRAM POSITIVE COCCI IN PAIRS    Culture   Final    FEW STAPHYLOCOCCUS AUREUS NO ANAEROBES ISOLATED Performed at [facility], [address] [place], [address] [place]    Report Status 08/27/2022 FINAL  Final   Organism ID, Bacteria STAPHYLOCOCCUS AUREUS  Final      Susceptibility    Staphylococcus aureus - MIC*    CIPROFLOXACIN <=0.5 SENSITIVE Sensitive     ERYTHROMYCIN <=0.25 SENSITIVE Sensitive     GENTAMICIN <=0.5 SENSITIVE Sensitive     OXACILLIN <=0.25 SENSITIVE Sensitive  TETRACYCLINE <=1 SENSITIVE Sensitive     VANCOMYCIN 1 SENSITIVE Sensitive     TRIMETH/SULFA <=10 SENSITIVE Sensitive     CLINDAMYCIN <=0.25 SENSITIVE Sensitive     RIFAMPIN <=0.5 SENSITIVE Sensitive     Inducible Clindamycin NEGATIVE Sensitive     * FEW STAPHYLOCOCCUS AUREUS     Radiology Studies: DG CHEST PORT 1 VIEW  Result Date: 08/29/2022 CLINICAL DATA:  PICC line placement EXAM: PORTABLE CHEST 1 VIEW COMPARISON:  None Available. FINDINGS: Right costophrenic angle excluded from view. Extreme lung apices excluded from view. Visualized lungs are clear. No definite pneumothorax or pleural effusion. Cardiac size within normal limits. Pulmonary vascularity is normal. Left upper extremity PICC line tip seen within the superior vena cava. No acute bone abnormality. IMPRESSION: 1. Left upper extremity PICC line tip within the superior vena cava. Electronically Signed   By: Fidela Salisbury M.D.   On: 08/29/2022 20:41   [place] EKG SITE RITE  Result Date: 08/29/2022 If Site Rite image not attached, placement could not be confirmed due to current cardiac rhythm.  [place] EKG SITE RITE  Result Date: 08/29/2022 If Site Rite image not attached, placement could not be confirmed due to current cardiac rhythm.  DG CHEST PORT 1 VIEW  Result Date: 08/29/2022 CLINICAL DATA:  PICC line placement. EXAM: PORTABLE CHEST 1 VIEW COMPARISON:  Radiographs 08/19/2022 and 08/13/2022. FINDINGS: 1453 hours. Three views are submitted. The central portion of a left arm PICC is not visualized beyond the level of the trachea, near the confluence of the brachiocephalic veins. The heart size and mediastinal contours are stable. The aeration of the lung bases has improved from the prior study. No evidence of pleural effusion or  pneumothorax. No acute osseous findings. IMPRESSION: The central portion of the left arm PICC is not well visualized and may terminate near the confluence of the brachiocephalic veins. Improved pulmonary aeration. Electronically Signed   By: Richardean Sale M.D.   On: 08/29/2022 15:52    Scheduled Meds:  Chlorhexidine Gluconate Cloth  6 each Topical Daily   insulin aspart  0-15 Units Subcutaneous TID WC   insulin aspart  3 Units Subcutaneous TID WC   insulin glargine-yfgn  18 Units Subcutaneous QHS   multivitamin with minerals  1 tablet Oral Daily   polyethylene glycol  17 g Oral Daily   Ensure Max Protein  11 oz Oral BID   senna-docusate  2 tablet Oral BID   sodium chloride flush  10-40 mL Intracatheter Q12H   sodium chloride flush  5 mL Intracatheter Q8H   tamsulosin  0.4 mg Oral Daily   Continuous Infusions:   ceFAZolin (ANCEF) IV 2 g (08/31/22 9357)   sodium chloride irrigation       LOS: 15 days   Shelly Coss, MD [facility] P1/27/2024, 10:58 AM

## 2022-09-01 ENCOUNTER — Inpatient Hospital Stay (HOSPITAL_COMMUNITY): Payer: Commercial Managed Care - PPO

## 2022-09-01 DIAGNOSIS — B9561 Methicillin susceptible Staphylococcus aureus infection as the cause of diseases classified elsewhere: Secondary | ICD-10-CM | POA: Diagnosis not present

## 2022-09-01 DIAGNOSIS — R7881 Bacteremia: Secondary | ICD-10-CM | POA: Diagnosis not present

## 2022-09-01 LAB — BASIC METABOLIC PANEL
Anion gap: 8 (ref 5–15)
BUN: 12 mg/dL (ref 6–20)
CO2: 26 mmol/L (ref 22–32)
Calcium: 8.2 mg/dL — ABNORMAL LOW (ref 8.9–10.3)
Chloride: 98 mmol/L (ref 98–111)
Creatinine, Ser: 0.7 mg/dL (ref 0.61–1.24)
GFR, Estimated: >60 mL/min (ref >60)
Glucose, Bld: 172 mg/dL — ABNORMAL HIGH (ref 70–99)
Potassium: 3.9 mmol/L (ref 3.5–5.1)
Sodium: 132 mmol/L — ABNORMAL LOW (ref 135–145)

## 2022-09-01 LAB — GLUCOSE, CAPILLARY
Glucose-Capillary: 178 mg/dL — ABNORMAL HIGH (ref 70–99)
Glucose-Capillary: 159 mg/dL — ABNORMAL HIGH (ref 70–99)
Glucose-Capillary: 113 mg/dL — ABNORMAL HIGH (ref 70–99)
Glucose-Capillary: 127 mg/dL — ABNORMAL HIGH (ref 70–99)

## 2022-09-01 LAB — CBC
HCT: 32.2 % — ABNORMAL LOW (ref 39.0–52.0)
Hemoglobin: 10.2 g/dL — ABNORMAL LOW (ref 13.0–17.0)
MCH: 26.5 pg (ref 26.0–34.0)
MCHC: 31.7 g/dL (ref 30.0–36.0)
MCV: 83.6 fL (ref 80.0–100.0)
Platelets: 245 10*3/uL (ref 150–400)
RBC: 3.85 MIL/uL — ABNORMAL LOW (ref 4.22–5.81)
RDW: 15.6 % — ABNORMAL HIGH (ref 11.5–15.5)
WBC: 7.7 10*3/uL (ref 4.0–10.5)
nRBC: 0 % (ref 0.0–0.2)

## 2022-09-01 MED ORDER — ALTEPLASE 2 MG IJ SOLR
2.0000 mg | Freq: Once | INTRAMUSCULAR | Status: AC
  Administered 2022-09-01: 2 mg
  Filled 2022-09-01 (×2): qty 2

## 2022-09-01 MED ORDER — ALTEPLASE 2 MG IJ SOLR
2.0000 mg | Freq: Once | INTRAMUSCULAR | Status: DC
  Filled 2022-09-01: qty 2

## 2022-09-01 NOTE — Progress Notes (Signed)
PROGRESS NOTE  William [name]  ZOX:[id number] DOB: 10-Jul-1961 DOA: 08/16/2022 PCP: Pcp, No   Brief Narrative: Patient is a 62 year old male with history of diabetes type 2, hypertension, osteomyelitis of left great toe status post amputation, hyperlipidemia, severe OSA, atrial fibrillation not on anticoagulation who was transferred here from [facility] after he presented with left flank pain in the setting of  prostatic abscess.  He was recently diagnosed with prostatitis and was treated with antibiotic for MSSA bacteremia.  Urology, IR consulted on admission.  ID also was following.  Plan for 6 weeks of antibiotics.  PT/OT recommending SNF/acute inpatient rehab.  Medically stable for discharge whenever possible.  Assessment & Plan:  Principal Problem:   MSSA bacteremia Active Problems:   Prostatic abscess   Possible psoas muscle infection   Osteomyelitis of left foot (HCC)   Hyperthyroidism   Hypercholesteremia   Morbid obesity (HCC)   Normocytic anemia   Paroxysmal atrial fibrillation (HCC)   Severe obstructive sleep apnea   Type 2 diabetes mellitus (HCC)   Hypokalemia   Hyponatremia   Malnutrition of moderate degree   MSSA bacteremia: Patient had positive blood culture at [facility] emergency department for MSSA.  Blood culture drawn here on 1/13 was negative till date.  Source thought to be secondary to ulcer on the foot with dissemination to the prostate, bilateral psoas muscles.  TEE negative for vegetation.  ID following, plan for 6 weeks of antibiotics. Currently on cefazolin.  PICC line placed on 1/19,exchanged on 1/25.  Bilateral psoas fluid collection: CT abdomen/pelvis showed bilateral iliopsoas fluid collection, right-sided collection slightly larger . IR consulted and he underwent CT-guided aspiration, drainage of fluid, drain placement of left psoas abscess.  Continue pain management, supportive care. CT abdomen/pelvis with contrast done on 1/24  for follow-up showed  persistent but decreasing size of fluid collection in the left psoas muscle. ,no significant  change in the right-sided psoas abscess compared to 08/21/2022.  IR following,needs outpatient follow up.  Prostatic abscess: Status post TURP, posterior abscess unroofing on 1/14 by Dr. Louis Meckel.  Foley discontinued  Left foot osteomyelitis: Status post left great toe partial amputation on  1/11 at [facility].  As per podiatry,leave sutures in place for 1 month, removal in February 11.  Follow-up with Dr. Rocco Serene in [place] in a month.Wound care was following,please follow recommendation  Hyperthyroidism: The TSH is normal.  Follow-up with endocrinology as an outpatient  Type 2 diabetes: Uncontrolled.  A1c of more than 12.  Continue current insulin regimen.  OSA: On CPAP at night  Paroxysmal A-fib:New diagnosis at this  admission.  CHA2DS2-Vasc 1.    Anticoagulation deferred as per above.  Currently in normal sinus rhythm  Obesity: BMI of 47  Nutrition Problem: Moderate Malnutrition Etiology: acute illness (osteomyelitis) Pressure Injury 08/25/22 Buttocks Medial;Mid Stage 2 -  Partial thickness loss of dermis presenting as a shallow open injury with a red, pink wound bed without slough. Circular, pink (Active)  08/25/22 1400  Location: Buttocks  Location Orientation: Medial;Mid  Staging: Stage 2 -  Partial thickness loss of dermis presenting as a shallow open injury with a red, pink wound bed without slough.  Wound Description (Comments): Circular, pink  Present on Admission:   Dressing Type Foam - Lift dressing to assess site every shift 08/29/22 1952     Pressure Injury 08/25/22 Ischial tuberosity Right Stage 2 -  Partial thickness loss of dermis presenting as a shallow open injury with a red, pink wound bed without  slough. Circular, pink (Active)  08/25/22 1400  Location: Ischial tuberosity  Location Orientation: Right  Staging: Stage 2 -  Partial thickness loss of dermis presenting as  a shallow open injury with a red, pink wound bed without slough.  Wound Description (Comments): Circular, pink  Present on Admission:   Dressing Type Foam - Lift dressing to assess site every shift 08/29/22 1952     Pressure Injury 08/25/22 Buttocks Right Stage 2 -  Partial thickness loss of dermis presenting as a shallow open injury with a red, pink wound bed without slough. Circula, pink (Active)  08/25/22 1400  Location: Buttocks  Location Orientation: Right  Staging: Stage 2 -  Partial thickness loss of dermis presenting as a shallow open injury with a red, pink wound bed without slough.  Wound Description (Comments): Circula, pink  Present on Admission:   Dressing Type Foam - Lift dressing to assess site every shift 08/31/22 1000    DVT prophylaxis:SCDs Start: 08/17/22 0026     Code Status: Full Code  Family Communication: wife at bedside on 1/26  Patient status:Inpatient  Patient is from :Home  Anticipated discharge to:SNF/AIR  Estimated DC date:whenever possible,waiting for bed   Consultants: ID,urology,IR  Procedures:drain placement  Antimicrobials:  Anti-infectives (From admission, onward)    Start     Dose/Rate Route Frequency Ordered Stop   08/18/22 2200  ceFAZolin (ANCEF) IVPB 2g/100 mL premix        2 g 200 mL/hr over 30 Minutes Intravenous Every 8 hours 08/18/22 1712     08/18/22 0200  cefTRIAXone (ROCEPHIN) 2 g in sodium chloride 0.9 % 100 mL IVPB  Status:  Discontinued        2 g 200 mL/hr over 30 Minutes Intravenous Every 24 hours 08/17/22 1304 08/18/22 1712   08/17/22 1400  vancomycin (VANCOREADY) IVPB 1750 mg/350 mL  Status:  Discontinued        1,750 mg 175 mL/hr over 120 Minutes Intravenous Every 12 hours 08/17/22 0049 08/18/22 1712   08/17/22 0130  cefTRIAXone (ROCEPHIN) 1 g in sodium chloride 0.9 % 100 mL IVPB  Status:  Discontinued        1 g 200 mL/hr over 30 Minutes Intravenous Every 24 hours 08/17/22 0039 08/17/22 1304   08/17/22 0130   vancomycin (VANCOREADY) IVPB 2000 mg/400 mL        2,000 mg 200 mL/hr over 120 Minutes Intravenous  Once 08/17/22 0042 08/17/22 0418       Subjective: Patient seen and examined at bedside today.  Hemodynamically stable without any complaints.  Objective: Vitals:   08/31/22 0949 08/31/22 1314 08/31/22 2009 09/01/22 0413  BP: (!) 142/91 122/74 122/87 (!) 155/95  Pulse: 100 95 (!) 105 95  Resp: 18 16 18 16   Temp: 98.7 F (37.1 C) 98.2 F (36.8 C) 100.2 F (37.9 C) 98.3 F (36.8 C)  TempSrc: Oral Oral Oral Oral  SpO2: 96% 95% 93% 98%  Weight:    (!) 169.5 kg  Height:        Intake/Output Summary (Last 24 hours) at 09/01/2022 1058 Last data filed at 09/01/2022 7829 Gross per 24 hour  Intake 250 ml  Output 1101 ml  Net -851 ml   Filed Weights   08/30/22 0641 08/31/22 0500 09/01/22 0413  Weight: (!) 170 kg (!) 170.2 kg (!) 169.5 kg    Examination:  General exam: Overall comfortable, not in distress, morbidly obese HEENT: PERRL Respiratory system:  no wheezes or crackles  Cardiovascular system:  S1 & S2 heard, RRR.  Gastrointestinal system: Abdomen is nondistended, soft and nontender.  Left sided abdominal drain Central nervous system: Alert and oriented Extremities: No edema, no clubbing ,no cyanosis, dressing on the left foot, PICC line in the left arm Skin: No rashes, no ulcers,no icterus      Data Reviewed: I have personally reviewed following labs and imaging studies  CBC: Recent Labs  Lab 08/26/22 0500 08/29/22 0840 09/01/22 0550  WBC 11.2* 10.7* 7.7  HGB 10.2* 10.6* 10.2*  HCT 32.1* 33.1* 32.2*  MCV 83.8 83.4 83.6  PLT 277 206 245   Basic Metabolic Panel: Recent Labs  Lab 08/26/22 0500 08/29/22 0840 09/01/22 0550  NA 131* 133* 132*  K 4.0 3.9 3.9  CL 98 99 98  CO2 28 27 26   GLUCOSE 206* 179* 172*  BUN 18 18 12   CREATININE 0.70 0.74 0.70  CALCIUM 8.0* 8.3* 8.2*     Recent Results (from the past 240 hour(s))  Culture, blood (Routine X 2) w  Reflex to ID Panel     Status: None   Collection Time: 08/22/22  1:03 PM   Specimen: BLOOD LEFT HAND  Result Value Ref Range Status   Specimen Description   Final    BLOOD LEFT HAND Performed at [facility], [address], [place], [address] [place]    Special Requests   Final    BOTTLES DRAWN AEROBIC AND ANAEROBIC Blood Culture adequate volume Performed at [facility], [address], [place], [place]    Culture   Final    NO GROWTH 5 DAYS Performed at [facility], [address] [place], [address] [place]    Report Status 08/27/2022 FINAL  Final  Culture, blood (Routine X 2) w Reflex to ID Panel     Status: None   Collection Time: 08/22/22  1:09 PM   Specimen: BLOOD  Result Value Ref Range Status   Specimen Description   Final    BLOOD BLOOD RIGHT HAND AEROBIC BOTTLE ONLY ANAEROBIC BOTTLE ONLY Performed at [facility], [address], [place], [place]    Special Requests   Final    BOTTLES DRAWN AEROBIC AND ANAEROBIC Blood Culture adequate volume Performed at [facility], [address], [place], [place]    Culture   Final    NO GROWTH 5 DAYS Performed at [facility], [address] [place], [address] [place]    Report Status 08/27/2022 FINAL  Final  Aerobic/Anaerobic Culture w Gram Stain (surgical/deep wound)     Status: None   Collection Time: 08/22/22  5:38 PM   Specimen: Abscess  Result Value Ref Range Status   Specimen Description   Final    ABSCESS Performed at [facility], [address] [place], [place]    Special Requests LEFT PSOAS  Final   Gram Stain   Final    ABUNDANT WBC PRESENT, PREDOMINANTLY PMN MODERATE GRAM POSITIVE COCCI IN PAIRS    Culture   Final    FEW STAPHYLOCOCCUS AUREUS NO ANAEROBES ISOLATED Performed at [facility], [address], [place], [address] [place]    Report Status 08/27/2022  FINAL  Final   Organism ID, Bacteria STAPHYLOCOCCUS AUREUS  Final      Susceptibility   Staphylococcus aureus - MIC*    CIPROFLOXACIN <=0.5 SENSITIVE Sensitive     ERYTHROMYCIN <=0.25 SENSITIVE Sensitive  GENTAMICIN <=0.5 SENSITIVE Sensitive     OXACILLIN <=0.25 SENSITIVE Sensitive     TETRACYCLINE <=1 SENSITIVE Sensitive     VANCOMYCIN 1 SENSITIVE Sensitive     TRIMETH/SULFA <=10 SENSITIVE Sensitive     CLINDAMYCIN <=0.25 SENSITIVE Sensitive     RIFAMPIN <=0.5 SENSITIVE Sensitive     Inducible Clindamycin NEGATIVE Sensitive     * FEW STAPHYLOCOCCUS AUREUS     Radiology Studies: No results found.  Scheduled Meds:  Chlorhexidine Gluconate Cloth  6 each Topical Daily   insulin aspart  0-15 Units Subcutaneous TID WC   insulin aspart  3 Units Subcutaneous TID WC   insulin glargine-yfgn  18 Units Subcutaneous QHS   multivitamin with minerals  1 tablet Oral Daily   polyethylene glycol  17 g Oral Daily   Ensure Max Protein  11 oz Oral BID   senna-docusate  2 tablet Oral BID   sodium chloride flush  10-40 mL Intracatheter Q12H   sodium chloride flush  5 mL Intracatheter Q8H   tamsulosin  0.4 mg Oral Daily   Continuous Infusions:   ceFAZolin (ANCEF) IV 2 g (09/01/22 0538)   sodium chloride irrigation       LOS: 16 days   Shelly Coss, MD [facility] P1/28/2024, 10:58 AM

## 2022-09-02 ENCOUNTER — Inpatient Hospital Stay: Payer: Self-pay

## 2022-09-02 DIAGNOSIS — B9561 Methicillin susceptible Staphylococcus aureus infection as the cause of diseases classified elsewhere: Secondary | ICD-10-CM | POA: Diagnosis not present

## 2022-09-02 DIAGNOSIS — R7881 Bacteremia: Secondary | ICD-10-CM | POA: Diagnosis not present

## 2022-09-02 LAB — GLUCOSE, CAPILLARY
Glucose-Capillary: 143 mg/dL — ABNORMAL HIGH (ref 70–99)
Glucose-Capillary: 179 mg/dL — ABNORMAL HIGH (ref 70–99)
Glucose-Capillary: 164 mg/dL — ABNORMAL HIGH (ref 70–99)
Glucose-Capillary: 129 mg/dL — ABNORMAL HIGH (ref 70–99)

## 2022-09-02 NOTE — Progress Notes (Signed)
PROGRESS NOTE  William [name]  ZOX:[id number] DOB: 10-Jul-1961 DOA: 08/16/2022 PCP: Pcp, No   Brief Narrative: Patient is a 62 year old male with history of diabetes type 2, hypertension, osteomyelitis of left great toe status post amputation, hyperlipidemia, severe OSA, atrial fibrillation not on anticoagulation who was transferred here from [facility] after he presented with left flank pain in the setting of  prostatic abscess.  He was recently diagnosed with prostatitis and was treated with antibiotic for MSSA bacteremia.  Urology, IR consulted on admission.  ID also was following.  Plan for 6 weeks of antibiotics.  PT/OT recommending SNF/acute inpatient rehab.  Medically stable for discharge whenever possible.  Assessment & Plan:  Principal Problem:   MSSA bacteremia Active Problems:   Prostatic abscess   Possible psoas muscle infection   Osteomyelitis of left foot (HCC)   Hyperthyroidism   Hypercholesteremia   Morbid obesity (HCC)   Normocytic anemia   Paroxysmal atrial fibrillation (HCC)   Severe obstructive sleep apnea   Type 2 diabetes mellitus (HCC)   Hypokalemia   Hyponatremia   Malnutrition of moderate degree   MSSA bacteremia: Patient had positive blood culture at [facility] emergency department for MSSA.  Blood culture drawn here on 1/13 was negative till date.  Source thought to be secondary to ulcer on the foot with dissemination to the prostate, bilateral psoas muscles.  TEE negative for vegetation.  ID following, plan for 6 weeks of antibiotics. Currently on cefazolin.  PICC line placed on 1/19,exchanged on 1/25.  Bilateral psoas fluid collection: CT abdomen/pelvis showed bilateral iliopsoas fluid collection, right-sided collection slightly larger . IR consulted and he underwent CT-guided aspiration, drainage of fluid, drain placement of left psoas abscess.  Continue pain management, supportive care. CT abdomen/pelvis with contrast done on 1/24  for follow-up showed  persistent but decreasing size of fluid collection in the left psoas muscle. ,no significant  change in the right-sided psoas abscess compared to 08/21/2022.  IR following,needs outpatient follow up.  Prostatic abscess: Status post TURP, posterior abscess unroofing on 1/14 by Dr. Louis Meckel.  Foley discontinued  Left foot osteomyelitis: Status post left great toe partial amputation on  1/11 at [facility].  As per podiatry,leave sutures in place for 1 month, removal in February 11.  Follow-up with Dr. Rocco Serene in [place] in a month.Wound care was following,please follow recommendation  Hyperthyroidism: The TSH is normal.  Follow-up with endocrinology as an outpatient  Type 2 diabetes: Uncontrolled.  A1c of more than 12.  Continue current insulin regimen.  OSA: On CPAP at night  Paroxysmal A-fib:New diagnosis at this  admission.  CHA2DS2-Vasc 1.    Anticoagulation deferred as per above.  Currently in normal sinus rhythm  Obesity: BMI of 47  Nutrition Problem: Moderate Malnutrition Etiology: acute illness (osteomyelitis) Pressure Injury 08/25/22 Buttocks Medial;Mid Stage 2 -  Partial thickness loss of dermis presenting as a shallow open injury with a red, pink wound bed without slough. Circular, pink (Active)  08/25/22 1400  Location: Buttocks  Location Orientation: Medial;Mid  Staging: Stage 2 -  Partial thickness loss of dermis presenting as a shallow open injury with a red, pink wound bed without slough.  Wound Description (Comments): Circular, pink  Present on Admission:   Dressing Type Foam - Lift dressing to assess site every shift 08/29/22 1952     Pressure Injury 08/25/22 Ischial tuberosity Right Stage 2 -  Partial thickness loss of dermis presenting as a shallow open injury with a red, pink wound bed without  slough. Circular, pink (Active)  08/25/22 1400  Location: Ischial tuberosity  Location Orientation: Right  Staging: Stage 2 -  Partial thickness loss of dermis presenting as  a shallow open injury with a red, pink wound bed without slough.  Wound Description (Comments): Circular, pink  Present on Admission:   Dressing Type Foam - Lift dressing to assess site every shift 08/29/22 1952     Pressure Injury 08/25/22 Buttocks Right Stage 2 -  Partial thickness loss of dermis presenting as a shallow open injury with a red, pink wound bed without slough. Circula, pink (Active)  08/25/22 1400  Location: Buttocks  Location Orientation: Right  Staging: Stage 2 -  Partial thickness loss of dermis presenting as a shallow open injury with a red, pink wound bed without slough.  Wound Description (Comments): Circula, pink  Present on Admission:   Dressing Type Foam - Lift dressing to assess site every shift 08/31/22 1000    DVT prophylaxis:SCDs Start: 08/17/22 0026     Code Status: Full Code  Family Communication: wife at bedside on 1/26  Patient status:Inpatient  Patient is from :Home  Anticipated discharge to:SNF/AIR  Estimated DC date:whenever possible,waiting for bed   Consultants: ID,urology,IR  Procedures:drain placement  Antimicrobials:  Anti-infectives (From admission, onward)    Start     Dose/Rate Route Frequency Ordered Stop   08/18/22 2200  ceFAZolin (ANCEF) IVPB 2g/100 mL premix        2 g 200 mL/hr over 30 Minutes Intravenous Every 8 hours 08/18/22 1712     08/18/22 0200  cefTRIAXone (ROCEPHIN) 2 g in sodium chloride 0.9 % 100 mL IVPB  Status:  Discontinued        2 g 200 mL/hr over 30 Minutes Intravenous Every 24 hours 08/17/22 1304 08/18/22 1712   08/17/22 1400  vancomycin (VANCOREADY) IVPB 1750 mg/350 mL  Status:  Discontinued        1,750 mg 175 mL/hr over 120 Minutes Intravenous Every 12 hours 08/17/22 0049 08/18/22 1712   08/17/22 0130  cefTRIAXone (ROCEPHIN) 1 g in sodium chloride 0.9 % 100 mL IVPB  Status:  Discontinued        1 g 200 mL/hr over 30 Minutes Intravenous Every 24 hours 08/17/22 0039 08/17/22 1304   08/17/22 0130   vancomycin (VANCOREADY) IVPB 2000 mg/400 mL        2,000 mg 200 mL/hr over 120 Minutes Intravenous  Once 08/17/22 0042 08/17/22 0418       Subjective: Patient seen and examined at bedside today.  Hemodynamically stable.  PICC line was placed on the right forearm yesterday.  No new complains   objective: Vitals:   09/01/22 0413 09/01/22 1400 09/01/22 1938 09/02/22 0605  BP: (!) 155/95 (!) 142/92 (!) 132/93 (!) 184/93  Pulse: 95 98 (!) 103 96  Resp: 16 18 17 20   Temp: 98.3 F (36.8 C) 98.4 F (36.9 C) 98 F (36.7 C) (!) 97.5 F (36.4 C)  TempSrc: Oral Oral Oral Oral  SpO2: 98% 96% 96% 98%  Weight: (!) 169.5 kg   (!) 168.2 kg  Height:        Intake/Output Summary (Last 24 hours) at 09/02/2022 1144 Last data filed at 09/02/2022 1000 Gross per 24 hour  Intake 1065 ml  Output 1790 ml  Net -725 ml   Filed Weights   08/31/22 0500 09/01/22 0413 09/02/22 0605  Weight: (!) 170.2 kg (!) 169.5 kg (!) 168.2 kg    Examination:  General exam: Overall comfortable,  not in distress, morbidly obese HEENT: PERRL Respiratory system:  no wheezes or crackles  Cardiovascular system: S1 & S2 heard, RRR.  Gastrointestinal system: Abdomen is nondistended, soft and nontender.  Left sided abdominal drain Central nervous system: Alert and oriented Extremities: No edema, no clubbing ,no cyanosis, dressing on the left foot, PICC line in the left arm and also on the right forearm Skin: wound on the left foot, no ulcers,no icterus      Data Reviewed: I have personally reviewed following labs and imaging studies  CBC: Recent Labs  Lab 08/29/22 0840 09/01/22 0550  WBC 10.7* 7.7  HGB 10.6* 10.2*  HCT 33.1* 32.2*  MCV 83.4 83.6  PLT 206 245   Basic Metabolic Panel: Recent Labs  Lab 08/29/22 0840 09/01/22 0550  NA 133* 132*  K 3.9 3.9  CL 99 98  CO2 27 26  GLUCOSE 179* 172*  BUN 18 12  CREATININE 0.74 0.70  CALCIUM 8.3* 8.2*     No results found for this or any previous visit  (from the past 240 hour(s)).    Radiology Studies: DG CHEST PORT 1 VIEW  Result Date: 09/01/2022 CLINICAL DATA:  PICC line placement EXAM: PORTABLE CHEST 1 VIEW COMPARISON:  08/29/2022 FINDINGS: Left PICC tip projecting over the location SVC/brachiocephalic confluence. Stable cardiomediastinal silhouette. No focal consolidation, pleural effusion, or pneumothorax. IMPRESSION: Left PICC tip at the SVC/brachiocephalic confluence. Electronically Signed   By: Minerva Fester M.D.   On: 09/01/2022 23:24    Scheduled Meds:  alteplase  2 mg Intracatheter Once   Chlorhexidine Gluconate Cloth  6 each Topical Daily   insulin aspart  0-15 Units Subcutaneous TID WC   insulin aspart  3 Units Subcutaneous TID WC   insulin glargine-yfgn  18 Units Subcutaneous QHS   multivitamin with minerals  1 tablet Oral Daily   polyethylene glycol  17 g Oral Daily   Ensure Max Protein  11 oz Oral BID   senna-docusate  2 tablet Oral BID   sodium chloride flush  10-40 mL Intracatheter Q12H   sodium chloride flush  5 mL Intracatheter Q8H   tamsulosin  0.4 mg Oral Daily   Continuous Infusions:   ceFAZolin (ANCEF) IV 2 g (09/02/22 0636)   sodium chloride irrigation       LOS: 17 days   Burnadette Pop, MD [facility] P1/29/2024, 11:44 AM

## 2022-09-02 NOTE — Progress Notes (Signed)
Inpatient Rehab Admissions Coordinator:   Per therapy recommendations, patient was screened for CIR candidacy by Keighley Deckman, MS, CCC-SLP. At this time, Pt. is not yet at a level to tolerate the intensity of CIR; however,   Pt. may have potential to progress to becoming a potential CIR candidate, so CIR admissions team will follow and monitor for progress and participation with therapies and place consult order if Pt. appears to be an appropriate candidate. Please contact me with any questions.   Monya Kozakiewicz, MS, CCC-SLP Rehab Admissions Coordinator  [phone] (celll) [phone] (office)  

## 2022-09-02 NOTE — Progress Notes (Signed)
When removing old PICC kink noted where the patient keeps his arms under his head. Instructed to keep arms moving to help with circulation and to rest it on the bed as much as possible.

## 2022-09-02 NOTE — Progress Notes (Signed)
Physical Therapy Treatment Patient Details Name: William [name] MRN: [id number] DOB: 1960-08-07 Today's Date: 09/02/2022   History of Present Illness Patient is 62 yo male admitted 08/16/22  for evaluation of persistent left-sided low back pain and pain along the suprapubic region , treated for dysuria recently, S/P PROSTATE ABSCESS UNROOFING, TRANSURETHRAL RESECTION OF THE PROSTATE. PMH: right great toe amputation,DM,HTN.previous history:On 08/12/2022 the patient was admitted to the outside hospital with DKA a and A-fib with RVR.  He was complaining of weakness shortness of breath.  He was noted to be hypoxic and his blood sugar was over 500.  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  Further evaluation with CT scan subsequently demonstrated possible prostatic abscess and psoas abscess on the left.    PT Comments    General Comments: AxO x 3 very pleasant Man currently [employer].  Nervous about standing.  "My legs are weak".  "My Right knee is the bad one".  "I'm knocked kneed". Assisted to EOB was difficult.  General bed mobility comments: assisted with rolling side to side then supine to sit required Max/Total Assist.  "I need something to pull on" due to weakness and body habitus.  Once upright, pt was able to static sit at [facility] but c/o weakness, stiffness and R knee pain.  "Feels good to sit up" but pt using B UE's to support self. Attempted to stand was unsuccessful.  General transfer comment: attempted to sit to stand x 3 however pt was unable to clear hips off bed.  Pt really wanted to stand and sit on [facility] for a BM.  Used SKY LIFT to transfer to recliner. Positioned in recliner to comfort.  B LE down and encouraged trunk flexion "sit ups", rotation as well as B UE punches. Pt progressing slowly and will need ST Rehab at SNF to address mobility and functional decline prior to  safely returning home.   Recommendations for follow up therapy are one component of a multi-disciplinary discharge planning process, led by the attending physician.  Recommendations may be updated based on patient status, additional functional criteria and insurance authorization.  Follow Up Recommendations  Skilled nursing-short term rehab (<3 hours/day) Can patient physically be transported by private vehicle: No   Assistance Recommended at Discharge Frequent or constant Supervision/Assistance  Patient can return home with the following Two people to help with walking and/or transfers;A lot of help with bathing/dressing/bathroom;Assist for transportation;Help with stairs or ramp for entrance;Assistance with cooking/housework;Direct supervision/assist for medications management   Equipment Recommendations  None recommended by PT    Recommendations for Other Services       Precautions / Restrictions Precautions Precautions: Fall Precaution Comments: , bariatric, profound weakness of legs, ? due to abcesses in liiopsoas L > R, Sepsis, recent gr. toe amp on  Left, Left JP drain Required Braces or Orthoses: Splint/Cast Splint/Cast: post op shoe for left, crocs Restrictions Weight Bearing Restrictions: No LLE Weight Bearing: Weight bearing as tolerated Other Position/Activity Restrictions: in [place] shoe (has in room)     Mobility  Bed Mobility Overal bed mobility: Needs Assistance Bed Mobility: Rolling Rolling: Max assist   Supine to sit: +2 for safety/equipment, +2 for physical assistance, Max assist, Total assist     General bed mobility comments: assisted with rolling side to side then supine to sit required Max/Total Assist.  "I need something to pull on" due to weakness  and body habitus.  Once upright, pt was able to static sit at [facility] but c/o weakness, stiffness and R knee pain.  "Feels good to sit up" but pt using B UE's to support self.    Transfers                    General transfer comment: attempted to sit to stand x 3 however pt was unable to clear hips off bed.  Pt really wanted to stand and sit on [facility] for a BM.  Used SKY LIFT to transfer to recliner.    Ambulation/Gait Ambulation/Gait assistance: Modified independent (Device/Increase time)                 Social research officer, government Rankin (Stroke Patients Only)       Balance                                            Cognition Arousal/Alertness: Awake/alert Behavior During Therapy: WFL for tasks assessed/performed, Anxious Overall Cognitive Status: Within Functional Limits for tasks assessed                                 General Comments: AxO x 3 very pleasant Man currently [employer].  Nervous about standing.  "My legs are weak".  "My Right knee is the bad one".  "I'm knocked kneed".        Exercises      General Comments        Pertinent Vitals/Pain Pain Assessment Pain Assessment: Faces Faces Pain Scale: Hurts little more Pain Location: R knee with flexion "bad knee"    Home Living                          Prior Function            PT Goals (current goals can now be found in the care plan section) Progress towards PT goals: Progressing toward goals    Frequency    Min 3X/week      PT Plan Current plan remains appropriate    Co-evaluation              AM-PAC PT "6 Clicks" Mobility   Outcome Measure  Help needed turning from your back to your side while in a flat bed without using bedrails?: Total Help needed moving from lying on your back to sitting on the side of a flat bed without using bedrails?: Total Help needed moving to and from a bed to a chair (including a wheelchair)?: Total Help needed standing up from a chair using your arms (e.g., wheelchair or bedside chair)?: Total Help needed to walk in hospital  room?: Total Help needed climbing 3-5 steps with a railing? : Total 6 Click Score: 6    End of Session Equipment Utilized During Treatment: Gait belt Activity Tolerance: Other (comment);Patient limited by fatigue (profound weakness) Patient left: in chair;with call bell/phone within reach Nurse Communication: Mobility status PT Visit Diagnosis: Unsteadiness on feet (R26.81);Difficulty in walking, not elsewhere classified (R26.2);Pain Pain - Right/Left: Right Pain - part of body: Knee     Time: 1151-1230 PT Time Calculation (min) (ACUTE ONLY):  39 min  Charges:  $Therapeutic Activity: 38-52 mins                     Rica Koyanagi  PTA Acute  Rehabilitation Services Office M-F          [phone] Weekend pager [phone]

## 2022-09-02 NOTE — Progress Notes (Signed)
Peripherally Inserted Central Catheter Placement  The IV Nurse has discussed with the patient and/or persons authorized to consent for the patient, the purpose of this procedure and the potential benefits and risks involved with this procedure.  The benefits include less needle sticks, lab draws from the catheter, and the patient may be discharged home with the catheter. Risks include, but not limited to, infection, bleeding, blood clot (thrombus formation), and puncture of an artery; nerve damage and irregular heartbeat and possibility to perform a PICC exchange if needed/ordered by physician.  Alternatives to this procedure were also discussed.  Bard Power PICC patient education guide, fact sheet on infection prevention and patient information card has been provided to patient /or left at bedside.    PICC Placement Documentation  PICC Single Lumen 51/70/01 Left Basilic 54 cm 0 cm (Active)  Site Assessment Clean, Dry, Intact 09/02/22 1358  Line Status Flushed;Saline locked;Blood return noted 09/02/22 1358  Dressing Type Transparent;Securing device 09/02/22 1358  Dressing Status Antimicrobial disc in place 09/02/22 1358  Safety Lock Not Applicable 74/94/49 6759  Dressing Change Due 09/09/22 09/02/22 1358    PICC exchanged over guidewire per order   Frances Maywood 09/02/2022, 2:02 PM

## 2022-09-02 NOTE — Progress Notes (Signed)
Referring Physician(s): Lama,G  Supervising Physician: Aletta Edouard  Patient Status:  [facility] - In-pt  Chief Complaint:  Prostate/psoas abscess  Subjective: Pt without new c/o   Allergies: Diltiazem hcl  Medications: Prior to Admission medications   Medication Sig Start Date End Date Taking? Authorizing Provider  albuterol (ACCUNEB) 1.25 MG/3ML nebulizer solution Take 1 ampule by nebulization every 6 (six) hours as needed for shortness of breath or wheezing. 07/21/22  Yes [provider]  meloxicam (MOBIC) 7.5 MG tablet Take 1 tablet (7.5 mg total) by mouth daily. 08/09/22  Yes Stoneking, Reece Leader., MD  oxyCODONE (OXY IR/ROXICODONE) 5 MG immediate release tablet Take 1 tablet (5 mg total) by mouth every 6 (six) hours as needed for severe pain. 08/09/22  Yes Stoneking, Reece Leader., MD  tamsulosin (FLOMAX) 0.4 MG CAPS capsule Take 1 capsule (0.4 mg total) by mouth daily. 08/09/22  Yes Stoneking, Reece Leader., MD     Vital Signs: BP (!) 184/93 (BP Location: Right Leg)   Pulse 96   Temp (!) 97.5 F (36.4 C) (Oral)   Resp 20   Ht 6\' 4"  (1.93 m)   Wt (!) 370 lb 13 oz (168.2 kg)   SpO2 98%   BMI 45.14 kg/m   Physical Exam awake/alert; LLQ/psoas drain intact, insertion site ok, NT, OP 15 cc, drain flushed but minimal return   Imaging: DG CHEST PORT 1 VIEW  Result Date: 09/01/2022 CLINICAL DATA:  PICC line placement EXAM: PORTABLE CHEST 1 VIEW COMPARISON:  08/29/2022 FINDINGS: Left PICC tip projecting over the location SVC/brachiocephalic confluence. Stable cardiomediastinal silhouette. No focal consolidation, pleural effusion, or pneumothorax. IMPRESSION: Left PICC tip at the SVC/brachiocephalic confluence. Electronically Signed   By: Placido Sou M.D.   On: 09/01/2022 23:24   DG CHEST PORT 1 VIEW  Result Date: 08/29/2022 CLINICAL DATA:  PICC line placement EXAM: PORTABLE CHEST 1 VIEW COMPARISON:  None Available. FINDINGS: Right costophrenic angle excluded from view.  Extreme lung apices excluded from view. Visualized lungs are clear. No definite pneumothorax or pleural effusion. Cardiac size within normal limits. Pulmonary vascularity is normal. Left upper extremity PICC line tip seen within the superior vena cava. No acute bone abnormality. IMPRESSION: 1. Left upper extremity PICC line tip within the superior vena cava. Electronically Signed   By: Fidela Salisbury M.D.   On: 08/29/2022 20:41   [place] EKG SITE RITE  Result Date: 08/29/2022 If Site Rite image not attached, placement could not be confirmed due to current cardiac rhythm.  [place] EKG SITE RITE  Result Date: 08/29/2022 If Site Rite image not attached, placement could not be confirmed due to current cardiac rhythm.  DG CHEST PORT 1 VIEW  Result Date: 08/29/2022 CLINICAL DATA:  PICC line placement. EXAM: PORTABLE CHEST 1 VIEW COMPARISON:  Radiographs 08/19/2022 and 08/13/2022. FINDINGS: 1453 hours. Three views are submitted. The central portion of a left arm PICC is not visualized beyond the level of the trachea, near the confluence of the brachiocephalic veins. The heart size and mediastinal contours are stable. The aeration of the lung bases has improved from the prior study. No evidence of pleural effusion or pneumothorax. No acute osseous findings. IMPRESSION: The central portion of the left arm PICC is not well visualized and may terminate near the confluence of the brachiocephalic veins. Improved pulmonary aeration. Electronically Signed   By: Richardean Sale M.D.   On: 08/29/2022 15:52    Labs:  CBC: Recent Labs    08/24/22 0628 08/26/22 0500  08/29/22 0840 09/01/22 0550  WBC 12.7* 11.2* 10.7* 7.7  HGB 10.4* 10.2* 10.6* 10.2*  HCT 32.1* 32.1* 33.1* 32.2*  PLT 317 277 206 245    COAGS: Recent Labs    08/17/22 0115  INR 1.4*    BMP: Recent Labs    08/25/22 0539 08/26/22 0500 08/29/22 0840 09/01/22 0550  NA 134* 131* 133* 132*  K 3.4* 4.0 3.9 3.9  CL 97* 98 99 98  CO2 29 28 27 26    GLUCOSE 175* 206* 179* 172*  BUN 15 18 18 12   CALCIUM 7.8* 8.0* 8.3* 8.2*  CREATININE 0.75 0.70 0.74 0.70  GFRNONAA >60 >60 >60 >60    LIVER FUNCTION TESTS: Recent Labs    08/19/22 1025 08/20/22 0543 08/23/22 0556 08/26/22 0500  BILITOT 0.5 0.7 0.5 0.5  AST 28 29 31 25   ALT 30 26 24 24   ALKPHOS 352* 322* 281* 234*  PROT 5.6* 5.3* 6.3* 6.6  ALBUMIN 1.7* 1.7* 2.1* 2.4*    Assessment and Plan: Pt with hx prostate/bilat psoas abscesses; s/p right psoas collection asp/left psoas collection drain placement 1/18 (10 fr to JP) ; afebrile, no new labs;  drain fl creat level ok; prior cx- few staph; cont current tx for now ; check f/u CT on 1/31   Electronically Signed: D. Rowe Robert, PA-C 09/02/2022, 10:44 AM   I spent a total of 15 Minutes at the the patient's bedside AND on the patient's hospital floor or unit, greater than 50% of which was counseling/coordinating care for left psoas abscess drain    Patient ID: William [name], male   DOB: 11/23/1960, 62 y.o.   MRN: [id number]

## 2022-09-02 NOTE — TOC Progression Note (Signed)
Transition of Care [facility]) - Progression Note    Patient Details  Name: William [name] MRN: [id number] Date of Birth: [birth year]  Transition of Care [facility] CM/SW Contact  Angelita Ingles, RN Phone Number:[phone]  09/02/2022, 2:34 PM  Clinical Narrative:    TOC continues to follow for SNF placement at [facility]. Per Kia admissions director the insurance Josem Kaufmann has been starting but remains pending.    Expected Discharge Plan: [place] Barriers to Discharge: Continued Medical Work up  Expected Discharge Plan and Services   Discharge Planning Services: CM Consult   Living arrangements for the past 2 months: Single Family Home                                       Social Determinants of Health (SDOH) Interventions SDOH Screenings   Food Insecurity: No Food Insecurity (08/28/2022)  Housing: Low Risk  (08/28/2022)  Transportation Needs: No Transportation Needs (08/28/2022)  Utilities: Not At Risk (08/28/2022)  Tobacco Use: Low Risk  (08/27/2022)    Readmission Risk Interventions     No data to display

## 2022-09-03 DIAGNOSIS — R7881 Bacteremia: Secondary | ICD-10-CM | POA: Diagnosis not present

## 2022-09-03 DIAGNOSIS — B9561 Methicillin susceptible Staphylococcus aureus infection as the cause of diseases classified elsewhere: Secondary | ICD-10-CM | POA: Diagnosis not present

## 2022-09-03 LAB — GLUCOSE, CAPILLARY
Glucose-Capillary: 144 mg/dL — ABNORMAL HIGH (ref 70–99)
Glucose-Capillary: 202 mg/dL — ABNORMAL HIGH (ref 70–99)
Glucose-Capillary: 112 mg/dL — ABNORMAL HIGH (ref 70–99)
Glucose-Capillary: 124 mg/dL — ABNORMAL HIGH (ref 70–99)

## 2022-09-03 NOTE — Progress Notes (Signed)
PROGRESS NOTE  Abbie Jablon  LOV:[id number] DOB: 26-Feb-1961 DOA: 08/16/2022 PCP: Pcp, No   Brief Narrative: Patient is a 62 year old male with history of diabetes type 2, hypertension, osteomyelitis of left great toe status post amputation, hyperlipidemia, severe OSA who was transferred here from [facility] after he presented with left flank pain in the setting of  prostatic abscess.  He was recently diagnosed with prostatitis and was treated with antibiotic for MSSA bacteremia.  Urology, IR consulted on admission.  ID also was following.  Plan for 6 weeks of antibiotics.  PT/OT recommending SNF/acute inpatient rehab.  Medically stable for discharge whenever possible.  Assessment & Plan:  Principal Problem:   MSSA bacteremia Active Problems:   Prostatic abscess   Possible psoas muscle infection   Osteomyelitis of left foot (HCC)   Hyperthyroidism   Hypercholesteremia   Morbid obesity (HCC)   Normocytic anemia   Paroxysmal atrial fibrillation (HCC)   Severe obstructive sleep apnea   Type 2 diabetes mellitus (HCC)   Hypokalemia   Hyponatremia   Malnutrition of moderate degree   MSSA bacteremia: Patient had positive blood culture at [facility] emergency department for MSSA.  Blood culture drawn here on 1/13 was negative till date.  Source thought to be secondary to ulcer on the foot with dissemination to the prostate, bilateral psoas muscles.  TEE negative for vegetation.  ID following, plan for 6 weeks of antibiotics. Currently on cefazolin.  PICC line placed.  Bilateral psoas fluid collection: CT abdomen/pelvis showed bilateral iliopsoas fluid collection, right-sided collection slightly larger . IR consulted and he underwent CT-guided aspiration, drainage of fluid, drain placement of left psoas abscess.   CT abdomen/pelvis with contrast done on 1/24  for follow-up showed persistent but decreasing size of fluid collection in the left psoas muscle.,no significant  change in the  right-sided psoas abscess compared to 08/21/2022.  IR was following,needs outpatient follow up.  Plan for follow-up CT scan today  Prostatic abscess: Status post TURP, posterior abscess unroofing on 1/14 by Dr. Louis Meckel.  Foley discontinued  Left foot osteomyelitis: Status post left great toe partial amputation on  1/11 at [facility].  As per podiatry,leave sutures in place for 1 month, removal in February 11.  Follow-up with Dr. Rocco Serene in [place] in a month.Wound care was following,please follow recommendation  Type 2 diabetes: Uncontrolled.  A1c of more than 12. Apparently not on any medications at home.  Continue current insulin regimen.  He needs insulin on discharge  Paroxysmal A-fib:New diagnosis at this  admission.  CHA2DS2-Vasc 1.    Anticoagulation deferred as per above.  Currently in normal sinus rhythm  Obesity: BMI of 47  Nutrition Problem: Moderate Malnutrition Etiology: acute illness (osteomyelitis) Pressure Injury 08/25/22 Buttocks Medial;Mid Stage 2 -  Partial thickness loss of dermis presenting as a shallow open injury with a red, pink wound bed without slough. Circular, pink (Active)  08/25/22 1400  Location: Buttocks  Location Orientation: Medial;Mid  Staging: Stage 2 -  Partial thickness loss of dermis presenting as a shallow open injury with a red, pink wound bed without slough.  Wound Description (Comments): Circular, pink  Present on Admission:   Dressing Type Foam - Lift dressing to assess site every shift 09/03/22 0823     Pressure Injury 08/25/22 Ischial tuberosity Right Stage 2 -  Partial thickness loss of dermis presenting as a shallow open injury with a red, pink wound bed without slough. Circular, pink (Active)  08/25/22 1400  Location: Ischial tuberosity  Location Orientation: Right  Staging: Stage 2 -  Partial thickness loss of dermis presenting as a shallow open injury with a red, pink wound bed without slough.  Wound Description (Comments): Circular,  pink  Present on Admission:   Dressing Type Foam - Lift dressing to assess site every shift 09/03/22 0823     Pressure Injury 08/25/22 Buttocks Right Stage 2 -  Partial thickness loss of dermis presenting as a shallow open injury with a red, pink wound bed without slough. Circula, pink (Active)  08/25/22 1400  Location: Buttocks  Location Orientation: Right  Staging: Stage 2 -  Partial thickness loss of dermis presenting as a shallow open injury with a red, pink wound bed without slough.  Wound Description (Comments): Circula, pink  Present on Admission:   Dressing Type Foam - Lift dressing to assess site every shift 09/03/22 0823    DVT prophylaxis:SCDs Start: 08/17/22 0026     Code Status: Full Code  Family Communication: wife at bedside on 1/26  Patient status:Inpatient  Patient is from :Home  Anticipated discharge to:SNF/AIR  Estimated DC date:whenever possible,waiting for bed   Consultants: ID,urology,IR  Procedures:drain placement  Antimicrobials:  Anti-infectives (From admission, onward)    Start     Dose/Rate Route Frequency Ordered Stop   08/18/22 2200  ceFAZolin (ANCEF) IVPB 2g/100 mL premix        2 g 200 mL/hr over 30 Minutes Intravenous Every 8 hours 08/18/22 1712     08/18/22 0200  cefTRIAXone (ROCEPHIN) 2 g in sodium chloride 0.9 % 100 mL IVPB  Status:  Discontinued        2 g 200 mL/hr over 30 Minutes Intravenous Every 24 hours 08/17/22 1304 08/18/22 1712   08/17/22 1400  vancomycin (VANCOREADY) IVPB 1750 mg/350 mL  Status:  Discontinued        1,750 mg 175 mL/hr over 120 Minutes Intravenous Every 12 hours 08/17/22 0049 08/18/22 1712   08/17/22 0130  cefTRIAXone (ROCEPHIN) 1 g in sodium chloride 0.9 % 100 mL IVPB  Status:  Discontinued        1 g 200 mL/hr over 30 Minutes Intravenous Every 24 hours 08/17/22 0039 08/17/22 1304   08/17/22 0130  vancomycin (VANCOREADY) IVPB 2000 mg/400 mL        2,000 mg 200 mL/hr over 120 Minutes Intravenous  Once  08/17/22 0042 08/17/22 0418       Subjective: Patient seen and examined at bedside today.  Remains comfortable without any complaints.  objective: Vitals:   09/02/22 0605 09/02/22 1450 09/02/22 2225 09/03/22 0602  BP: (!) 184/93 127/82 (!) 177/98 123/84  Pulse: 96 (!) 101 100 93  Resp: 20 17 20 20   Temp: (!) 97.5 F (36.4 C) 98.5 F (36.9 C) 98.9 F (37.2 C) 98.2 F (36.8 C)  TempSrc: Oral Oral Oral Oral  SpO2: 98% 95% 97% 99%  Weight: (!) 168.2 kg   (!) 166.5 kg  Height:        Intake/Output Summary (Last 24 hours) at 09/03/2022 1128 Last data filed at 09/03/2022 0900 Gross per 24 hour  Intake 250 ml  Output 1505 ml  Net -1255 ml   Filed Weights   09/01/22 0413 09/02/22 0605 09/03/22 0602  Weight: (!) 169.5 kg (!) 168.2 kg (!) 166.5 kg    Examination:     General exam: Overall comfortable, not in distress, morbidly obese HEENT: PERRL Respiratory system:  no wheezes or crackles  Cardiovascular system: S1 & S2 heard, RRR.  Gastrointestinal system: Abdomen is nondistended, soft and nontender.  Left-sided abdomen drain Central nervous system: Alert and oriented Extremities: No edema, no clubbing ,no cyanosis, dressing in the left foot.  PICC line in the left arm Skin: No rashes,no icterus    Data Reviewed: I have personally reviewed following labs and imaging studies  CBC: Recent Labs  Lab 08/29/22 0840 09/01/22 0550  WBC 10.7* 7.7  HGB 10.6* 10.2*  HCT 33.1* 32.2*  MCV 83.4 83.6  PLT 206 629   Basic Metabolic Panel: Recent Labs  Lab 08/29/22 0840 09/01/22 0550  NA 133* 132*  K 3.9 3.9  CL 99 98  CO2 27 26  GLUCOSE 179* 172*  BUN 18 12  CREATININE 0.74 0.70  CALCIUM 8.3* 8.2*     No results found for this or any previous visit (from the past 240 hour(s)).    Radiology Studies: [place] EKG Site Rite  Result Date: 09/02/2022 If [facility] image not attached, placement could not be confirmed due to current cardiac rhythm.  DG CHEST PORT 1  VIEW  Result Date: 09/01/2022 CLINICAL DATA:  PICC line placement EXAM: PORTABLE CHEST 1 VIEW COMPARISON:  08/29/2022 FINDINGS: Left PICC tip projecting over the location SVC/brachiocephalic confluence. Stable cardiomediastinal silhouette. No focal consolidation, pleural effusion, or pneumothorax. IMPRESSION: Left PICC tip at the SVC/brachiocephalic confluence. Electronically Signed   By: Placido Sou M.D.   On: 09/01/2022 23:24    Scheduled Meds:  alteplase  2 mg Intracatheter Once   Chlorhexidine Gluconate Cloth  6 each Topical Daily   insulin aspart  0-15 Units Subcutaneous TID WC   insulin aspart  3 Units Subcutaneous TID WC   insulin glargine-yfgn  18 Units Subcutaneous QHS   multivitamin with minerals  1 tablet Oral Daily   polyethylene glycol  17 g Oral Daily   Ensure Max Protein  11 oz Oral BID   senna-docusate  2 tablet Oral BID   sodium chloride flush  10-40 mL Intracatheter Q12H   sodium chloride flush  5 mL Intracatheter Q8H   tamsulosin  0.4 mg Oral Daily   Continuous Infusions:   ceFAZolin (ANCEF) IV 2 g (09/03/22 0539)   sodium chloride irrigation       LOS: 18 days   Shelly Coss, MD [facility] P1/30/2024, 11:28 AM

## 2022-09-03 NOTE — TOC Progression Note (Signed)
Transition of Care [facility]) - Progression Note    Patient Details  Name: William [name] MRN: [id number] Date of Birth: 22-Jun-1961  [facility]) CM/SW Contact  Angelita Ingles, RN Phone Number:[phone]  09/03/2022, 8:39 AM  Clinical Narrative:    CM following up on insurance auth initiated by [employer], per [employer] Josem Kaufmann is still pending.    Expected Discharge Plan: [place] Barriers to Discharge: Continued Medical Work up  Expected Discharge Plan and Services   Discharge Planning Services: CM Consult   Living arrangements for the past 2 months: Single Family Home                                       Social Determinants of Health (SDOH) Interventions SDOH Screenings   Food Insecurity: No Food Insecurity (08/28/2022)  Housing: Low Risk  (08/28/2022)  Transportation Needs: No Transportation Needs (08/28/2022)  Utilities: Not At Risk (08/28/2022)  Tobacco Use: Low Risk  (08/27/2022)    Readmission Risk Interventions     No data to display

## 2022-09-03 NOTE — Progress Notes (Signed)
Occupational Therapy Treatment Patient Details Name: William [name] MRN: [id number] DOB: [birth year] Today's Date: 09/03/2022   History of present illness Patient is 62 yo male admitted 08/16/22  for evaluation of persistent left-sided low back pain and pain along the suprapubic region , treated for dysuria recently, S/P PROSTATE ABSCESS UNROOFING, TRANSURETHRAL RESECTION OF THE PROSTATE. PMH: right great toe amputation,DM,HTN.previous history:On 08/12/2022 the patient was admitted to the outside hospital with DKA a and A-fib with RVR.  He was complaining of weakness shortness of breath.  He was noted to be hypoxic and his blood sugar was over 500.  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  Further evaluation with CT scan subsequently demonstrated possible prostatic abscess and psoas abscess on the left.   OT comments  Patient was noted to make progress towards goals. Patient was able to engage in bed mobility and sitting EOB with less physical assistance during this session compared to previous sessions. Session focused on there act to increase patients functional activity tolerance and ability to engage in transfers. Patient's discharge plan remains appropriate at this time. OT will continue to follow acutely.     Recommendations for follow up therapy are one component of a multi-disciplinary discharge planning process, led by the attending physician.  Recommendations may be updated based on patient status, additional functional criteria and insurance authorization.    Follow Up Recommendations  Skilled nursing-short term rehab (<3 hours/day)     Assistance Recommended at Discharge Frequent or constant Supervision/Assistance  Patient can return home with the following  Two people to help with walking and/or transfers;Two people to help with bathing/dressing/bathroom;Assistance with cooking/housework;Help with stairs or  ramp for entrance;Assist for transportation   Equipment Recommendations  Other (comment) (defer to next venue)    Recommendations for Other Services      Precautions / Restrictions Precautions Precautions: Fall Precaution Comments: , bariatric, profound weakness of legs, ? due to abcesses in liiopsoas L > R, Sepsis, recent gr. toe amp on  Left, Left JP drain Required Braces or Orthoses: Splint/Cast Splint/Cast: post op shoe for left, crocs Restrictions Weight Bearing Restrictions: No LLE Weight Bearing: Weight bearing as tolerated Other Position/Activity Restrictions: in [place] shoe (has in room)              ADL either performed or assessed with clinical judgement   ADL Overall ADL's : Needs assistance/impaired       General ADL Comments: patient endorsed completing all ADLs this AM. patient was educated on participation in sitting EOB with less UE support to work on trunk control. patient needed min guard with increased cues to participate. patient was educated on scooting up head of bed to improve ability to engage in transfers. patient was able to participate in scooting with max A particially up side of bed with BLE blocked. patient noted to have increased pain with attempted in back with patient transitioned back to supine.      Cognition Arousal/Alertness: Awake/alert Behavior During Therapy: WFL for tasks assessed/performed, Anxious Overall Cognitive Status: Within Functional Limits for tasks assessed       General Comments: motivated to participate in session        Exercises Other Exercises Other Exercises: patient participated in bicep and tricep exercises with green theraband at bed level to increased strength in UE> patient was educated on importance of participation in function over exercise. patient verbalized understanding. mobility specailist in room  present for training.            Pertinent Vitals/ Pain       Pain Assessment Pain Assessment:  Faces Faces Pain Scale: Hurts even more Pain Location: R knee with flexion "bad knee" Pain Descriptors / Indicators: Grimacing, Guarding, Discomfort Pain Intervention(s): Limited activity within patient's tolerance, Monitored during session, Patient requesting pain meds-RN notified, RN gave pain meds during session         Frequency  Min 2X/week        Progress Toward Goals  OT Goals(current goals can now be found in the care plan section)  Progress towards OT goals: Progressing toward goals     Plan Discharge plan needs to be updated       AM-PAC OT "6 Clicks" Daily Activity     Outcome Measure   Help from another person eating meals?: None Help from another person taking care of personal grooming?: A Little Help from another person toileting, which includes using toliet, bedpan, or urinal?: Total Help from another person bathing (including washing, rinsing, drying)?: A Lot Help from another person to put on and taking off regular upper body clothing?: A Little Help from another person to put on and taking off regular lower body clothing?: Total 6 Click Score: 14    End of Session Equipment Utilized During Treatment: Other (comment) (post op shoe)  OT Visit Diagnosis: Other abnormalities of gait and mobility (R26.89);Muscle weakness (generalized) (M62.81);Pain Pain - Right/Left: Left Pain - part of body: Ankle and joints of foot   Activity Tolerance Patient tolerated treatment well   Patient Left in bed;with call bell/phone within reach   Nurse Communication Patient requests pain meds        Time: 4888-9169 OT Time Calculation (min): 35 min  Charges: OT General Charges $OT Visit: 1 Visit OT Treatments $Therapeutic Activity: 23-37 mins  Rennie Plowman, MS Acute Rehabilitation Department Office# [phone]   Willa Rough 09/03/2022, 4:01 PM

## 2022-09-04 ENCOUNTER — Inpatient Hospital Stay (HOSPITAL_COMMUNITY): Payer: Commercial Managed Care - PPO

## 2022-09-04 DIAGNOSIS — E876 Hypokalemia: Secondary | ICD-10-CM | POA: Diagnosis not present

## 2022-09-04 DIAGNOSIS — E7800 Pure hypercholesterolemia, unspecified: Secondary | ICD-10-CM | POA: Diagnosis not present

## 2022-09-04 DIAGNOSIS — R7881 Bacteremia: Secondary | ICD-10-CM | POA: Diagnosis not present

## 2022-09-04 DIAGNOSIS — D649 Anemia, unspecified: Secondary | ICD-10-CM

## 2022-09-04 DIAGNOSIS — E0590 Thyrotoxicosis, unspecified without thyrotoxic crisis or storm: Secondary | ICD-10-CM | POA: Diagnosis not present

## 2022-09-04 DIAGNOSIS — E1165 Type 2 diabetes mellitus with hyperglycemia: Secondary | ICD-10-CM

## 2022-09-04 LAB — GLUCOSE, CAPILLARY
Glucose-Capillary: 188 mg/dL — ABNORMAL HIGH (ref 70–99)
Glucose-Capillary: 162 mg/dL — ABNORMAL HIGH (ref 70–99)
Glucose-Capillary: 134 mg/dL — ABNORMAL HIGH (ref 70–99)
Glucose-Capillary: 99 mg/dL (ref 70–99)

## 2022-09-04 MED ORDER — ATORVASTATIN CALCIUM 40 MG PO TABS
40.0000 mg | ORAL_TABLET | Freq: Every day | ORAL | Status: DC
  Administered 2022-09-04 – 2022-09-06 (×3): 40 mg via ORAL
  Filled 2022-09-04 (×3): qty 1

## 2022-09-04 MED ORDER — CEFAZOLIN SODIUM-DEXTROSE 2-4 GM/100ML-% IV SOLN: 2.0000 g | Freq: Three times a day (TID) | INTRAVENOUS | 0 refills | Status: DC

## 2022-09-04 MED ORDER — IOHEXOL 300 MG/ML  SOLN
100.0000 mL | Freq: Once | INTRAMUSCULAR | Status: AC | PRN
  Administered 2022-09-04: 100 mL via INTRAVENOUS

## 2022-09-04 MED ORDER — POLYETHYLENE GLYCOL 3350 17 G PO PACK: 17.0000 g | PACK | Freq: Two times a day (BID) | ORAL | 0 refills | Status: AC | PRN

## 2022-09-04 MED ORDER — SENNOSIDES-DOCUSATE SODIUM 8.6-50 MG PO TABS: 2.0000 | ORAL_TABLET | Freq: Two times a day (BID) | ORAL | Status: AC | PRN

## 2022-09-04 MED ORDER — OXYCODONE HCL 5 MG PO TABS: 5.0000 mg | ORAL_TABLET | Freq: Four times a day (QID) | ORAL | 0 refills | Status: AC | PRN

## 2022-09-04 MED ORDER — CEFAZOLIN IV (FOR PTA / DISCHARGE USE ONLY): 2.0000 g | Freq: Three times a day (TID) | INTRAVENOUS | 0 refills | Status: AC

## 2022-09-04 MED ORDER — INSULIN ASPART 100 UNIT/ML IJ SOLN: 0.0000 [IU] | Freq: Three times a day (TID) | INTRAMUSCULAR | 11 refills | Status: AC

## 2022-09-04 MED ORDER — INSULIN GLARGINE-YFGN 100 UNIT/ML ~~LOC~~ SOLN: 20.0000 [IU] | Freq: Every day | SUBCUTANEOUS | 11 refills | Status: AC

## 2022-09-04 NOTE — Progress Notes (Signed)
Patient ID: William [name], male   DOB: Aug 12, 1960, 62 y.o.   MRN: [id number]    Referring Physician(s): Lama,G  Supervising Physician: Michaelle Birks  Patient Status:  [facility] - In-pt  Chief Complaint:  Prostate/psoas abscess  Subjective: Pt without new c/o; has been getting PT   Allergies: Diltiazem hcl  Medications: Prior to Admission medications   Medication Sig Start Date End Date Taking? Authorizing Provider  albuterol (ACCUNEB) 1.25 MG/3ML nebulizer solution Take 1 ampule by nebulization every 6 (six) hours as needed for shortness of breath or wheezing. 07/21/22  Yes [provider]  meloxicam (MOBIC) 7.5 MG tablet Take 1 tablet (7.5 mg total) by mouth daily. 08/09/22  Yes Stoneking, Reece Leader., MD  tamsulosin (FLOMAX) 0.4 MG CAPS capsule Take 1 capsule (0.4 mg total) by mouth daily. 08/09/22  Yes Stoneking, Reece Leader., MD  ceFAZolin (ANCEF) 2-4 GM/100ML-% IVPB Inject 100 mLs (2 g total) into the vein every 8 (eight) hours for 28 days. 09/04/22 10/02/22  Mercy Riding, MD  insulin aspart (NOVOLOG) 100 UNIT/ML injection Inject 0-15 Units into the skin 3 (three) times daily with meals. CBG 70 - 120: 0 units CBG 121 - 150: 2 units CBG 151 - 200: 3 units CBG 201 - 250: 5 units CBG 251 - 300: 8 units CBG 301 - 350: 11 units CBG 351 - 400: 15 units CBG > 400: call MD and obtain STAT lab verification 09/04/22   Mercy Riding, MD  insulin glargine-yfgn (SEMGLEE) 100 UNIT/ML injection Inject 0.2 mLs (20 Units total) into the skin at bedtime. 09/04/22   Mercy Riding, MD  oxyCODONE (OXY IR/ROXICODONE) 5 MG immediate release tablet Take 1 tablet (5 mg total) by mouth every 6 (six) hours as needed for up to 7 days for severe pain. 09/04/22 09/11/22  Mercy Riding, MD  polyethylene glycol (MIRALAX / GLYCOLAX) 17 g packet Take 17 g by mouth 2 (two) times daily as needed for mild constipation. 09/04/22   Mercy Riding, MD  senna-docusate (SENOKOT-S) 8.6-50 MG tablet Take 2 tablets by mouth 2 (two)  times daily as needed for mild constipation. 09/04/22   Mercy Riding, MD     Vital Signs: BP 112/68 (BP Location: Right Arm)   Pulse 91   Temp 98.8 F (37.1 C) (Oral)   Resp 18   Ht 6\' 4"  (1.93 m)   Wt (!) 367 lb 1.1 oz (166.5 kg)   SpO2 98%   BMI 44.68 kg/m   Physical Exam awake/alert ; LLQ intact, insertion site ok, site NT,  no sig output  Imaging: [place] EKG Site Rite  Result Date: 09/02/2022 If Site Rite image not attached, placement could not be confirmed due to current cardiac rhythm.  DG CHEST PORT 1 VIEW  Result Date: 09/01/2022 CLINICAL DATA:  PICC line placement EXAM: PORTABLE CHEST 1 VIEW COMPARISON:  08/29/2022 FINDINGS: Left PICC tip projecting over the location SVC/brachiocephalic confluence. Stable cardiomediastinal silhouette. No focal consolidation, pleural effusion, or pneumothorax. IMPRESSION: Left PICC tip at the SVC/brachiocephalic confluence. Electronically Signed   By: Placido Sou M.D.   On: 09/01/2022 23:24    Labs:  CBC: Recent Labs    08/24/22 0628 08/26/22 0500 08/29/22 0840 09/01/22 0550  WBC 12.7* 11.2* 10.7* 7.7  HGB 10.4* 10.2* 10.6* 10.2*  HCT 32.1* 32.1* 33.1* 32.2*  PLT 317 277 206 245    COAGS: Recent Labs    08/17/22 0115  INR 1.4*    BMP: Recent Labs  08/25/22 0539 08/26/22 0500 08/29/22 0840 09/01/22 0550  NA 134* 131* 133* 132*  K 3.4* 4.0 3.9 3.9  CL 97* 98 99 98  CO2 29 28 27 26   GLUCOSE 175* 206* 179* 172*  BUN 15 18 18 12   CALCIUM 7.8* 8.0* 8.3* 8.2*  CREATININE 0.75 0.70 0.74 0.70  GFRNONAA >60 >60 >60 >60    LIVER FUNCTION TESTS: Recent Labs    08/19/22 1025 08/20/22 0543 08/23/22 0556 08/26/22 0500  BILITOT 0.5 0.7 0.5 0.5  AST 28 29 31 25   ALT 30 26 24 24   ALKPHOS 352* 322* 281* 234*  PROT 5.6* 5.3* 6.3* 6.6  ALBUMIN 1.7* 1.7* 2.1* 2.4*    Assessment and Plan: Pt with hx prostate/bilat psoas abscesses; s/p right psoas collection asp/left psoas collection drain placement 1/18 (10 fr to  JP) ; afebrile, no new labs;  drain fl creat level ok; prior cx- few staph ; latest CT A/P reviewed by Dr. Maryelizabeth Kaufmann- prelim finding shows that left psoas abscess has resolved; rt psoas collection stable; following d/w Dr. Maryelizabeth Kaufmann, LLQ drain removed in its entirety without immediate complications. Gauze dressing applied to site.    Electronically Signed: D. Rowe Robert, PA-C 09/04/2022, 1:50 PM   I spent a total of 15 Minutes at the the patient's bedside AND on the patient's hospital floor or unit, greater than 50% of which was counseling/coordinating care for left psoas abscess drain

## 2022-09-04 NOTE — Progress Notes (Signed)
PHARMACY CONSULT NOTE FOR:  OUTPATIENT  PARENTERAL ANTIBIOTIC THERAPY (OPAT)  Indication: MSSA bacteremia  Regimen: cefazolin 2g IV q8h End date: 10/02/2022  IV antibiotic discharge orders are pended. To discharging provider:  please sign these orders via discharge navigator,  Select New Orders & click on the button choice - Manage This Unsigned Work.     Thank you for allowing pharmacy to be a part of this patient's care.  Candie Mile 09/04/2022, 3:46 PM

## 2022-09-04 NOTE — Progress Notes (Signed)
Mobility Specialist - Progress Note   09/04/22 0900  Mobility  Activity Moved into chair position in bed  Level of Assistance Other (Comment) (bed exercises)  Assistive Device Other (Comment) (therabands)  Activity Response Tolerated fair  Mobility Referral Yes  $Mobility charge 1 Mobility   Pt received in bed and agreed to bed exercises. Pt did 3x15 bicep curls, tricep extensions and reverse flys. Also attempted knee slides with assistance from bed sheet and this mobility tech. R knee could only tolerate one knee slide whilst the L knee could tolerate two.   On the second attempt of knee slide on L knee, pt knee bent above 30 degree angle.   Pt back in bed with all needs met.   Roderick Pee Mobility Specialist

## 2022-09-04 NOTE — Progress Notes (Signed)
Physical Therapy Treatment Patient Details Name: William [name] MRN: [id number] DOB: April 28, 1961 Today's Date: 09/04/2022   History of Present Illness Patient is 61 yo male admitted 08/16/22  for evaluation of persistent left-sided low back pain and pain along the suprapubic region , treated for dysuria recently, S/P PROSTATE ABSCESS UNROOFING, TRANSURETHRAL RESECTION OF THE PROSTATE. PMH: right great toe amputation,DM,HTN.previous history:On 08/12/2022 the patient was admitted to the outside hospital with DKA a and A-fib with RVR.  He was complaining of weakness shortness of breath.  He was noted to be hypoxic and his blood sugar was over 500.  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  Further evaluation with CT scan subsequently demonstrated possible prostatic abscess and psoas abscess on the left.    PT Comments     Patient participated in tilt bed feature. Slowly  tilted to 45* /75 kg WB, performed slight knee bends with straps in place and Stayed in position  x 4 minutes, then lowered to 35* x  8 minutes.  Recommendations for follow up therapy are one component of a multi-disciplinary discharge planning process, led by the attending physician.  Recommendations may be updated based on patient status, additional functional criteria and insurance authorization.  Follow Up Recommendations  Skilled nursing-short term rehab (<3 hours/day) Can patient physically be transported by private vehicle: No   Assistance Recommended at Discharge Frequent or constant Supervision/Assistance  Patient can return home with the following Two people to help with walking and/or transfers;A lot of help with bathing/dressing/bathroom;Assist for transportation;Help with stairs or ramp for entrance;Assistance with cooking/housework;Direct supervision/assist for medications management   Equipment Recommendations  None recommended by PT     Recommendations for Other Services       Precautions / Restrictions Precautions Precaution Comments: , bariatric, profound weakness of legs, ? due to abcesses in liiopsoas L > R, Sepsis, recent gr. toe amp on  Left, Left JP drain Required Braces or Orthoses: Splint/Cast Splint/Cast: post op shoe for left, crocs Restrictions LLE Weight Bearing: Weight bearing as tolerated     Mobility  Bed Mobility               General bed mobility comments: NT Start Time: 1110 Angle: 45 degrees Total Minutes in Angle: 3 minutes  Transfers                   General transfer comment: NT    Ambulation/Gait                   Stairs             Wheelchair Mobility    Modified Rankin (Stroke Patients Only)       Balance Overall balance assessment: Needs assistance Sitting-balance support: Feet supported, Bilateral upper extremity supported Sitting balance-[name] Scale: Poor         Standing balance comment: tilt bed at 45* straps secure over knees                            Cognition Arousal/Alertness: Awake/alert Behavior During Therapy: Anxious, WFL for tasks assessed/performed Overall Cognitive Status: Within Functional Limits for tasks assessed                                 General Comments: encouragement to participate in the  tilt  bed for WBing,        Exercises      General Comments        Pertinent Vitals/Pain Pain Assessment Faces Pain Scale: Hurts little more Pain Location: R knee with flexion "bad knee" Pain Descriptors / Indicators: Grimacing, Guarding, Discomfort Pain Intervention(s): Monitored during session    Home Living                          Prior Function            PT Goals (current goals can now be found in the care plan section) Acute Rehab PT Goals Patient Stated Goal: to get out of bed, walk PT Goal Formulation: With patient Time For Goal Achievement:  09/18/22 Potential to Achieve Goals: Good Progress towards PT goals: Progressing toward goals    Frequency    Min 3X/week      PT Plan Current plan remains appropriate    Co-evaluation              AM-PAC PT "6 Clicks" Mobility   Outcome Measure  Help needed turning from your back to your side while in a flat bed without using bedrails?: A Lot Help needed moving from lying on your back to sitting on the side of a flat bed without using bedrails?: Total Help needed moving to and from a bed to a chair (including a wheelchair)?: Total Help needed standing up from a chair using your arms (e.g., wheelchair or bedside chair)?: Total Help needed to walk in hospital room?: Total Help needed climbing 3-5 steps with a railing? : Total 6 Click Score: 7    End of Session   Activity Tolerance: Patient tolerated treatment well Patient left: in bed;with call bell/phone within reach Nurse Communication: Mobility status;Need for lift equipment PT Visit Diagnosis: Unsteadiness on feet (R26.81);Difficulty in walking, not elsewhere classified (R26.2);Pain Pain - Right/Left: Right Pain - part of body: Knee     Time: 1100-1130 PT Time Calculation (min) (ACUTE ONLY): 30 min  Charges:  $Therapeutic Activity: 23-37 mins                     [place] Office [phone] Weekend POEUM-[phone]    Claretha Cooper 09/04/2022, 1:01 PM

## 2022-09-04 NOTE — TOC Progression Note (Addendum)
Transition of Care [facility]) - Progression Note    Patient Details  Name: Zebediah Beezley MRN: [id number] Date of Birth: [birth year]  Transition of [facility]) CM/SW Contact  Angelita Ingles, RN Phone Number:[phone]  09/04/2022, 8:35 AM  Clinical Narrative:    CM received message from [facility] admissions director at [facility] stating that insurance Josem Kaufmann is still pending.  1215 Cm received message from [facility] admissions director for [facility] stating that insurance has approved patient and facility will have bed available tomorrow.    Expected Discharge Plan: [place] Barriers to Discharge: Continued Medical Work up  Expected Discharge Plan and Services   Discharge Planning Services: CM Consult   Living arrangements for the past 2 months: Single Family Home                                       Social Determinants of Health (SDOH) Interventions SDOH Screenings   Food Insecurity: No Food Insecurity (08/28/2022)  Housing: Low Risk  (08/28/2022)  Transportation Needs: No Transportation Needs (08/28/2022)  Utilities: Not At Risk (08/28/2022)  Tobacco Use: Low Risk  (08/27/2022)    Readmission Risk Interventions    09/03/2022    3:40 PM  Readmission Risk Prevention Plan  Transportation Screening Complete  PCP or Specialist Appt within 5-7 Days Complete

## 2022-09-04 NOTE — Progress Notes (Signed)
Nutrition Follow-up  DOCUMENTATION CODES:   Non-severe (moderate) malnutrition in context of acute illness/injury, Obesity unspecified  INTERVENTION:   -Ensure MAX Protein po BID, each supplement provides 150 kcal and 30 grams of protein    -Multivitamin with minerals daily   -Placed "Carbohydrate Counting" and " Plate Method" handout in AVS. Reviewed diet principles with patient 1/17.   -RD to sign off, no other interventions planned.  NUTRITION DIAGNOSIS:   Moderate Malnutrition related to acute illness (osteomyelitis) as evidenced by mild fat depletion, mild muscle depletion, energy intake < 75% for > 7 days.  Ongoing.  GOAL:   Patient will meet greater than or equal to 90% of their needs  Progressing.  MONITOR:   PO intake, Supplement acceptance, Labs, Weight trends, I & O's  ASSESSMENT:   Pt with DM, HTN, MO, hyperthyroidism as well as recent osteomyelitis of the left great toe status post amputation, who was transferred from outside hospital for prostatic abscess.  1/12: admitted 1/18: CT guided drain placement of left psoas abscess  1/23: s/p TEE  Patient consuming 100% of meals. Accepting protein supplements.  Admission weight: 392 lbs Current weight: 367 lbs Per nursing documentation, pt with mild BLE edema.  Medications: Multivitamin with minerals daily, Miralax, Senokot  Labs reviewed: CBGs: 112-202   Diet Order:   Diet Order             Diet - low sodium heart healthy           Diet Carb Modified Fluid consistency: Thin; Room service appropriate? Yes  Diet effective now                   EDUCATION NEEDS:   Education needs have been addressed  Skin:  Skin Assessment: Skin Integrity Issues: Skin Integrity Issues:: Stage II Stage II: right buttocks, right IT, mid buttocks  Last BM:  1/31  Height:   Ht Readings from Last 1 Encounters:  08/24/22 6\' 4"  (1.93 m)    Weight:   Wt Readings from Last 1 Encounters:  09/04/22 (!)  166.5 kg    BMI:  Body mass index is 44.68 kg/m.  Estimated Nutritional Needs:   Kcal:  2300-2500  Protein:  140-155g  Fluid:  2.5L/day  Clayton Bibles, MS, RD, LDN Inpatient Clinical Dietitian Contact information available via Amion

## 2022-09-04 NOTE — Progress Notes (Signed)
PROGRESS NOTE  William [name] PXT:[id number] DOB: [birth year]   PCP: Pcp, No  Patient is from: Home.  DOA: 08/16/2022 LOS: 41  Chief complaints No chief complaint on file.    Brief Narrative / Interim history: 62 year old M with PMH of DM-2, HTN, osteomyelitis of left great toe s/p amputation at [facility], hypothyroidism, HLD, recent diagnosis of prostatitis, severe OSA not on CPAP and morbid obesity who was transferred here from [facility] after he presented with left flank pain in the setting of prostatic abscess and bilateral psoas abscess.  Blood culture at [facility] with MSSA.  Patient was started on IV antibiotics.  Urology, IR and ID consulted.  He underwent turbid with unroofing of prostate abscess.  Had drain placement for left psoas abscess on 1/18.  Blood culture on 1/13 NGTD.  TEE without vegetation.  ID recommended IV Ancef for 6 weeks, with end date on 10/02/2022.  Therapy recommended SNF.  Medically stable for discharge whenever possible.     Subjective: Seen and examined this afternoon.  No major events overnight of this morning.  Just had drain removed by IR.  No complaints.  Objective: Vitals:   09/03/22 2203 09/04/22 0622 09/04/22 1258 09/04/22 1316  BP: (!) 128/97 (!) 133/93 (!) 108/58 112/68  Pulse: 99 96  91  Resp: 20 20  18   Temp: 97.7 F (36.5 C) 98.2 F (36.8 C)  98.8 F (37.1 C)  TempSrc: Oral Oral  Oral  SpO2: 99% 99%  98%  Weight:  (!) 166.5 kg    Height:        Examination:  GENERAL: No apparent distress.  Nontoxic. HEENT: MMM.  Vision and hearing grossly intact.  NECK: Supple.  No apparent JVD.  RESP:  No IWOB.  Fair aeration bilaterally. CVS:  RRR. Heart sounds normal.  ABD/GI/GU: BS+. Abd soft, NTND.  MSK/EXT:  Moves extremities.  Dressing and Ace wrap over left foot DCI. SKIN: no apparent skin lesion or wound NEURO: Awake, alert and oriented appropriately.  No apparent focal neuro deficit. PSYCH: Calm. Normal affect.   Procedures:   1/18-drain placement for left psoas abscess 1/23-TEE  Microbiology summarized: 1/14-urine culture with pansensitive Staph aureus. 1/13-blood cultures NGTD 1/18-blood cultures NGTD. 1/18-abscess culture with pansensitive Staph aureus.  Assessment and plan: Principal Problem:   MSSA bacteremia Active Problems:   Prostatic abscess   Possible psoas muscle infection   Osteomyelitis of left foot (HCC)   Hyperthyroidism   Hypercholesteremia   Morbid obesity (HCC)   Normocytic anemia   Paroxysmal atrial fibrillation (HCC)   Severe obstructive sleep apnea   Type 2 diabetes mellitus (HCC)   Hypokalemia   Hyponatremia   Malnutrition of moderate degree  MSSA bacteremia: Blood cultures at [facility] with MSSA.  Repeat blood culture on 1/13 NGTD.  TTE without vegetation.  Source of infection felt to be left foot osteomyelitis.  -ID recommended antibiotics, IV Ancef for total of 6 weeks, until 10/02/2022.  PICC line in place. -Outpatient follow-up with ID -Final disposition SNF.   Bilateral psoas fluid collection:  -S/p aspiration and drain placement for left psoas abscess by IR  -Repeat CT on 1/24 with decreasing size of left psoas abscess but without significant change in right-sided psoas abscess.  -IR was following,needs outpatient follow up.  Repeat CT abdomen and pelvis pending. -Antibiotics as above.   Prostatic abscess:  -Status post TURP, posterior abscess unroofing on 1/14 by Dr. Louis Meckel.  Foley discontinued   Left foot osteomyelitis:  -S/p left great toe  partial amputation on 1/11 at [facility].   -As per podiatry,leave sutures in place for 1 month, removal in February 11.   -Follow-up with Dr. Rocco Serene in [place] in a month. -Wound care was following   Uncontrolled NIDDM-2 with hyperglycemia:: A1c 12.9%.  Does not seem to be on medication at home. Recent Labs  Lab 09/03/22 1154 09/03/22 1709 09/03/22 2200 09/04/22 0742 09/04/22 1129  GLUCAP 202* 112* 124* 188* 162*   -Continue current insulin regimen -Start atorvastatin.   Paroxysmal A-fib: New onset on admission.  Converted to NSR.  CHA2DS2-Vasc 1.    -Anticoagulation deferred as per above.   OSA not on CPAP:  Hypokalemia/hyponatremia -Monitor replenish as appropriate  Morbid obesity/moderate malnutrition Body mass index is 44.68 kg/m. Nutrition Problem: Moderate Malnutrition Etiology: acute illness (osteomyelitis) Signs/Symptoms: mild fat depletion, mild muscle depletion, energy intake < 75% for > 7 days Interventions: Premier Protein, MVI, Education  Pressure skin injury: POA Pressure Injury 08/25/22 Buttocks Medial;Mid Stage 2 -  Partial thickness loss of dermis presenting as a shallow open injury with a red, pink wound bed without slough. Circular, pink (Active)  08/25/22 1400  Location: Buttocks  Location Orientation: Medial;Mid  Staging: Stage 2 -  Partial thickness loss of dermis presenting as a shallow open injury with a red, pink wound bed without slough.  Wound Description (Comments): Circular, pink  Present on Admission:   Dressing Type None 09/03/22 1957     Pressure Injury 08/25/22 Ischial tuberosity Right Stage 2 -  Partial thickness loss of dermis presenting as a shallow open injury with a red, pink wound bed without slough. Circular, pink (Active)  08/25/22 1400  Location: Ischial tuberosity  Location Orientation: Right  Staging: Stage 2 -  Partial thickness loss of dermis presenting as a shallow open injury with a red, pink wound bed without slough.  Wound Description (Comments): Circular, pink  Present on Admission:   Dressing Type Foam - Lift dressing to assess site every shift 09/03/22 1957     Pressure Injury 08/25/22 Buttocks Right Stage 2 -  Partial thickness loss of dermis presenting as a shallow open injury with a red, pink wound bed without slough. Circula, pink (Active)  08/25/22 1400  Location: Buttocks  Location Orientation: Right  Staging: Stage 2 -   Partial thickness loss of dermis presenting as a shallow open injury with a red, pink wound bed without slough.  Wound Description (Comments): Circula, pink  Present on Admission:   Dressing Type Foam - Lift dressing to assess site every shift 09/03/22 1957   DVT prophylaxis:  SCDs Start: 08/17/22 0026  Code Status: Full code Family Communication: None at bedside. Level of care: Telemetry Status is: Inpatient Remains inpatient appropriate because: SNF   Final disposition: SNF Consultants:  Infectious disease IR  55 minutes with more than 50% spent in reviewing records, counseling patient/family and coordinating care.   Sch Meds:  Scheduled Meds:  alteplase  2 mg Intracatheter Once   Chlorhexidine Gluconate Cloth  6 each Topical Daily   insulin aspart  0-15 Units Subcutaneous TID WC   insulin aspart  3 Units Subcutaneous TID WC   insulin glargine-yfgn  18 Units Subcutaneous QHS   multivitamin with minerals  1 tablet Oral Daily   polyethylene glycol  17 g Oral Daily   Ensure Max Protein  11 oz Oral BID   senna-docusate  2 tablet Oral BID   sodium chloride flush  10-40 mL Intracatheter Q12H   sodium chloride flush  5 mL Intracatheter Q8H   tamsulosin  0.4 mg Oral Daily   Continuous Infusions:   ceFAZolin (ANCEF) IV 2 g (09/04/22 0605)   sodium chloride irrigation     PRN Meds:.acetaminophen **OR** acetaminophen, bisacodyl, fentaNYL (SUBLIMAZE) injection, naLOXone (NARCAN)  injection, mouth rinse, oxyCODONE, sodium chloride flush  Antimicrobials: Anti-infectives (From admission, onward)    Start     Dose/Rate Route Frequency Ordered Stop   09/04/22 0000  ceFAZolin (ANCEF) 2-4 GM/100ML-% IVPB        2 g Intravenous Every 8 hours 09/04/22 0849 10/02/22 2359   08/18/22 2200  ceFAZolin (ANCEF) IVPB 2g/100 mL premix        2 g 200 mL/hr over 30 Minutes Intravenous Every 8 hours 08/18/22 1712     08/18/22 0200  cefTRIAXone (ROCEPHIN) 2 g in sodium chloride 0.9 % 100 mL IVPB   Status:  Discontinued        2 g 200 mL/hr over 30 Minutes Intravenous Every 24 hours 08/17/22 1304 08/18/22 1712   08/17/22 1400  vancomycin (VANCOREADY) IVPB 1750 mg/350 mL  Status:  Discontinued        1,750 mg 175 mL/hr over 120 Minutes Intravenous Every 12 hours 08/17/22 0049 08/18/22 1712   08/17/22 0130  cefTRIAXone (ROCEPHIN) 1 g in sodium chloride 0.9 % 100 mL IVPB  Status:  Discontinued        1 g 200 mL/hr over 30 Minutes Intravenous Every 24 hours 08/17/22 0039 08/17/22 1304   08/17/22 0130  vancomycin (VANCOREADY) IVPB 2000 mg/400 mL        2,000 mg 200 mL/hr over 120 Minutes Intravenous  Once 08/17/22 0042 08/17/22 0418        I have personally reviewed the following labs and images: CBC: Recent Labs  Lab 08/29/22 0840 09/01/22 0550  WBC 10.7* 7.7  HGB 10.6* 10.2*  HCT 33.1* 32.2*  MCV 83.4 83.6  PLT 206 245   BMP &GFR Recent Labs  Lab 08/29/22 0840 09/01/22 0550  NA 133* 132*  K 3.9 3.9  CL 99 98  CO2 27 26  GLUCOSE 179* 172*  BUN 18 12  CREATININE 0.74 0.70  CALCIUM 8.3* 8.2*   Estimated Creatinine Clearance: 166.9 mL/min (by C-G formula based on SCr of 0.7 mg/dL). Liver & Pancreas: No results for input(s): "AST", "ALT", "ALKPHOS", "BILITOT", "PROT", "ALBUMIN" in the last 168 hours. No results for input(s): "LIPASE", "AMYLASE" in the last 168 hours. No results for input(s): "AMMONIA" in the last 168 hours. Diabetic: No results for input(s): "HGBA1C" in the last 72 hours. Recent Labs  Lab 09/03/22 1154 09/03/22 1709 09/03/22 2200 09/04/22 0742 09/04/22 1129  GLUCAP 202* 112* 124* 188* 162*   Cardiac Enzymes: No results for input(s): "CKTOTAL", "CKMB", "CKMBINDEX", "TROPONINI" in the last 168 hours. No results for input(s): "PROBNP" in the last 8760 hours. Coagulation Profile: No results for input(s): "INR", "PROTIME" in the last 168 hours. Thyroid Function Tests: No results for input(s): "TSH", "T4TOTAL", "FREET4", "T3FREE", "THYROIDAB"  in the last 72 hours. Lipid Profile: No results for input(s): "CHOL", "HDL", "LDLCALC", "TRIG", "CHOLHDL", "LDLDIRECT" in the last 72 hours. Anemia Panel: No results for input(s): "VITAMINB12", "FOLATE", "FERRITIN", "TIBC", "IRON", "RETICCTPCT" in the last 72 hours. Urine analysis:    Component Value Date/Time   COLORURINE YELLOW 08/06/2022 [place] 08/06/2022 1255   LABSPEC 1.023 08/06/2022 1255   PHURINE 6.0 08/06/2022 1255   GLUCOSEU >=500 (A) 08/06/2022 1255   HGBUR SMALL (A) 08/06/2022  1255   BILIRUBINUR NEGATIVE 08/06/2022 1255   KETONESUR 20 (A) 08/06/2022 1255   PROTEINUR 30 (A) 08/06/2022 1255   NITRITE NEGATIVE 08/06/2022 1255   LEUKOCYTESUR NEGATIVE 08/06/2022 1255   Sepsis Labs: Invalid input(s): "PROCALCITONIN", "LACTICIDVEN"  Microbiology: No results found for this or any previous visit (from the past 240 hour(s)).  Radiology Studies: No results found.    Makalia Bare T. Jovannie Ulibarri [facility]  If 7PM-7AM, please contact night-coverage [email or web address] 09/04/2022, 1:20 PM

## 2022-09-05 DIAGNOSIS — M86072 Acute hematogenous osteomyelitis, left ankle and foot: Secondary | ICD-10-CM | POA: Diagnosis not present

## 2022-09-05 DIAGNOSIS — D649 Anemia, unspecified: Secondary | ICD-10-CM | POA: Diagnosis not present

## 2022-09-05 DIAGNOSIS — R7881 Bacteremia: Secondary | ICD-10-CM | POA: Diagnosis not present

## 2022-09-05 DIAGNOSIS — E1165 Type 2 diabetes mellitus with hyperglycemia: Secondary | ICD-10-CM | POA: Diagnosis not present

## 2022-09-05 LAB — CBC
HCT: 34.6 % — ABNORMAL LOW (ref 39.0–52.0)
Hemoglobin: 10.8 g/dL — ABNORMAL LOW (ref 13.0–17.0)
MCH: 26.5 pg (ref 26.0–34.0)
MCHC: 31.2 g/dL (ref 30.0–36.0)
MCV: 84.8 fL (ref 80.0–100.0)
Platelets: 356 10*3/uL (ref 150–400)
RBC: 4.08 MIL/uL — ABNORMAL LOW (ref 4.22–5.81)
RDW: 15.8 % — ABNORMAL HIGH (ref 11.5–15.5)
WBC: 7.7 10*3/uL (ref 4.0–10.5)
nRBC: 0 % (ref 0.0–0.2)

## 2022-09-05 LAB — C-REACTIVE PROTEIN: CRP: 2.6 mg/dL — ABNORMAL HIGH (ref <1.0)

## 2022-09-05 LAB — GLUCOSE, CAPILLARY
Glucose-Capillary: 195 mg/dL — ABNORMAL HIGH (ref 70–99)
Glucose-Capillary: 137 mg/dL — ABNORMAL HIGH (ref 70–99)
Glucose-Capillary: 108 mg/dL — ABNORMAL HIGH (ref 70–99)
Glucose-Capillary: 201 mg/dL — ABNORMAL HIGH (ref 70–99)

## 2022-09-05 LAB — RENAL FUNCTION PANEL
Albumin: 2.5 g/dL — ABNORMAL LOW (ref 3.5–5.0)
Anion gap: 9 (ref 5–15)
BUN: 12 mg/dL (ref 6–20)
CO2: 24 mmol/L (ref 22–32)
Calcium: 8.3 mg/dL — ABNORMAL LOW (ref 8.9–10.3)
Chloride: 99 mmol/L (ref 98–111)
Creatinine, Ser: 0.69 mg/dL (ref 0.61–1.24)
GFR, Estimated: >60 mL/min (ref >60)
Glucose, Bld: 238 mg/dL — ABNORMAL HIGH (ref 70–99)
Phosphorus: 3.2 mg/dL (ref 2.5–4.6)
Potassium: 3.7 mmol/L (ref 3.5–5.1)
Sodium: 132 mmol/L — ABNORMAL LOW (ref 135–145)

## 2022-09-05 LAB — MAGNESIUM: Magnesium: 1.8 mg/dL (ref 1.7–2.4)

## 2022-09-05 LAB — LIPID PANEL
Cholesterol: 131 mg/dL (ref 0–200)
HDL: 24 mg/dL — ABNORMAL LOW (ref >40)
LDL Cholesterol: 85 mg/dL (ref 0–99)
Total CHOL/HDL Ratio: 5.5 RATIO
Triglycerides: 111 mg/dL (ref <150)
VLDL: 22 mg/dL (ref 0–40)

## 2022-09-05 LAB — SEDIMENTATION RATE: Sed Rate: 42 mm/hr — ABNORMAL HIGH (ref 0–16)

## 2022-09-05 MED ORDER — ATORVASTATIN CALCIUM 20 MG PO TABS: 20.0000 mg | ORAL_TABLET | Freq: Every day | ORAL | Status: AC

## 2022-09-05 NOTE — Discharge Summary (Addendum)
Physician Discharge Summary  William [name] HBZ:[id number] DOB: 15-Aug-1960 DOA: 08/16/2022  PCP: Pcp, No  Admit date: 08/16/2022 Discharge date: 09/05/2022 Admitted From: Home Disposition: SNF Recommendations for Outpatient Follow-up:  Follow up with infectious disease as below Check BMP and CBC with differential weekly Check CRP and ESR biweekly Please follow up on the following pending results: None   Discharge Condition: Stable CODE STATUS: Full code  Follow-up Information     Laurice Record, MD Follow up on 09/10/2022.   Specialty: Infectious Diseases Contact information: [address] [place] [address] [phone]                 Hospital course 62 year old M with PMH of DM-2, HTN, osteomyelitis of left great toe s/p amputation at [facility], hypothyroidism, HLD, recent diagnosis of prostatitis, severe OSA not on CPAP and morbid obesity who was transferred here from [facility] after he presented with left flank pain in the setting of prostatic abscess and bilateral psoas abscess.  Blood culture at [facility] with MSSA.  Patient was started on IV antibiotics.  Urology, IR and ID consulted.  He underwent turbid with unroofing of prostate abscess.  Had drain placement for left psoas abscess on 1/18.  Blood culture on 1/13 NGTD.  TEE without vegetation.  Repeat CT on 1/31 with resolution of left psoas abscess and decreased in size of right psoas abscess from 3.5-2.5 cm.  Drain removed on 1/31.  ID recommended IV Ancef for 6 weeks, with end date on 10/02/2022.  Outpatient follow-up with ID as above.  Therapy recommended SNF.   See individual problem list below for more.   Problems addressed during this hospitalization Principal Problem:   MSSA bacteremia Active Problems:   Prostatic abscess   Possible psoas muscle infection   Osteomyelitis of left foot (HCC)   Hyperthyroidism   Hypercholesteremia   Morbid obesity (HCC)   Normocytic anemia   Paroxysmal  atrial fibrillation (HCC)   Severe obstructive sleep apnea   Type 2 diabetes mellitus (HCC)   Hypokalemia   Hyponatremia   Malnutrition of moderate degree   MSSA bacteremia: Blood cultures at [facility] with MSSA.  Repeat blood culture on 1/13 NGTD.  TTE without vegetation.  Source of infection felt to be left foot osteomyelitis.  -ID recommended antibiotics, IV Ancef for total of 6 weeks, until 10/02/2022.  PICC line in place. -BMP and CBC with differential weekly -CRP and ESR biweekly -Outpatient follow-up with ID -Final disposition SNF.   Bilateral psoas fluid collection:  -S/p aspiration and drain placement for left psoas abscess from 1/18-1/31. -Repeat CT on on 1/31 with resolution of left psoas abscess and decreased size of right psoas abscess from 3.5 to 2.5 cm -Antibiotics as above.   Prostatic abscess:  -Status post TURP, posterior abscess unroofing on 1/14 by Dr. Louis Meckel.  Foley discontinued -Antibiotics as above.   Left foot osteomyelitis:  -S/p left great toe partial amputation on 1/11 at [facility].   -As per podiatry,leave sutures in place for 1 month, removal in February 11.   -Follow-up with Dr. Rocco Serene in [place] in a month. -Wound care as below.   Uncontrolled NIDDM-2 with hyperglycemia:: A1c 12.9%.  Does not seem to be on medication at home. -Lantus and sliding scale insulin as below -Lipitor 20 mg daily   Paroxysmal A-fib: New onset on admission.  Converted to NSR.  CHA2DS2-Vasc 1.    -Anticoagulation deferred as per above.    OSA not on CPAP:  Hypokalemia/hyponatremia: Resolved   Morbid obesity/moderate malnutrition Nutrition Problem: Moderate Malnutrition Etiology: acute illness (osteomyelitis) Signs/Symptoms: mild fat depletion, mild muscle depletion, energy intake < 75% for > 7 days Interventions: Premier Protein, MVI, Education  Pressure skin injury: POA Pressure Injury 08/25/22 Buttocks Medial;Mid Stage 2 -  Partial thickness loss of dermis  presenting as a shallow open injury with a red, pink wound bed without slough. Circular, pink (Active)  08/25/22 1400  Location: Buttocks  Location Orientation: Medial;Mid  Staging: Stage 2 -  Partial thickness loss of dermis presenting as a shallow open injury with a red, pink wound bed without slough.  Wound Description (Comments): Circular, pink  Present on Admission:   Dressing Type Foam - Lift dressing to assess site every shift 09/05/22 0800     Pressure Injury 08/25/22 Ischial tuberosity Right Stage 2 -  Partial thickness loss of dermis presenting as a shallow open injury with a red, pink wound bed without slough. Circular, pink (Active)  08/25/22 1400  Location: Ischial tuberosity  Location Orientation: Right  Staging: Stage 2 -  Partial thickness loss of dermis presenting as a shallow open injury with a red, pink wound bed without slough.  Wound Description (Comments): Circular, pink  Present on Admission:   Dressing Type Foam - Lift dressing to assess site every shift 09/05/22 0800     Pressure Injury 08/25/22 Buttocks Right Stage 2 -  Partial thickness loss of dermis presenting as a shallow open injury with a red, pink wound bed without slough. Circula, pink (Active)  08/25/22 1400  Location: Buttocks  Location Orientation: Right  Staging: Stage 2 -  Partial thickness loss of dermis presenting as a shallow open injury with a red, pink wound bed without slough.  Wound Description (Comments): Circula, pink  Present on Admission:   Dressing Type Foam - Lift dressing to assess site every shift 09/05/22 0800    Vital signs Vitals:   09/04/22 1258 09/04/22 1316 09/04/22 1945 09/05/22 0447  BP: (!) 108/58 112/68 122/74 136/86  Pulse:  91 68 (!) 101  Temp:  98.8 F (37.1 C) 98.3 F (36.8 C) 97.8 F (36.6 C)  Resp:  18 18 18   Height:      Weight:      SpO2:  98% 100% 98%  TempSrc:  Oral Oral Oral  BMI (Calculated):         Discharge exam  GENERAL: No apparent  distress.  Nontoxic. HEENT: MMM.  Vision and hearing grossly intact.  NECK: Supple.  No apparent JVD.  RESP:  No IWOB.  Fair aeration bilaterally. CVS:  RRR. Heart sounds normal.  ABD/GI/GU: BS+. Abd soft, NTND.  MSK/EXT:  Moves extremities.  Dressing and Ace wrap over left foot DCI. SKIN: no apparent skin lesion or wound NEURO: Awake and alert. Oriented appropriately.  No apparent focal neuro deficit. PSYCH: Calm. Normal affect.   Discharge Instructions Discharge Instructions     Change dressing on IV access line weekly and PRN   Complete by: As directed    Diet - low sodium heart healthy   Complete by: As directed    Discharge wound care:   Complete by: As directed    Wound care  Daily      Comments: Bedside RN to perform and teach family/patient as needed for discharge. Cleanse left toe amputation site with NS and pat dry. Apply adaptic to suture line for atraumatic dressing removal.  Cover with gauze and kerlix/tape  change daily.   Flush  IV access with Sodium Chloride 0.9% and Heparin 10 units/ml or 100 units/ml   Complete by: As directed    Home infusion instructions - [facility]   Complete by: As directed    Instructions: Flush IV access with Sodium Chloride 0.9% and Heparin 10units/ml or 100units/ml   Change dressing on IV access line: Weekly and PRN   Instructions Cath Flo 2mg : Administer for PICC Line occlusion and as ordered by physician for other access device   [facility] pharmacist to adjust dose for: Vancomycin, Aminoglycosides and other anti-infective therapies as requested by physician   Increase activity slowly   Complete by: As directed    Method of administration may be changed at the discretion of home infusion pharmacist based upon assessment of the patient and/or caregiver's ability to self-administer the medication ordered   Complete by: As directed    Outpatient Parenteral Antibiotic Therapy Information Antibiotic: Cefazolin (Ancef)  IVPB; Indications for use: MSSA bacteremia; End Date: 10/02/2022   Complete by: As directed    Antibiotic: Cefazolin (Ancef) IVPB   Indications for use: MSSA bacteremia   End Date: 10/02/2022      Allergies as of 09/05/2022       Reactions   Diltiazem Hcl Other (See Comments)   Causes accelerated heart rate        Medication List     STOP taking these medications    ciprofloxacin 500 MG tablet Commonly known as: CIPRO   meloxicam 7.5 MG tablet Commonly known as: Mobic       TAKE these medications    albuterol 1.25 MG/3ML nebulizer solution Commonly known as: ACCUNEB Take 1 ampule by nebulization every 6 (six) hours as needed for shortness of breath or wheezing.   atorvastatin 20 MG tablet Commonly known as: LIPITOR Take 1 tablet (20 mg total) by mouth daily.   ceFAZolin  IVPB Commonly known as: ANCEF Inject 2 g into the vein every 8 (eight) hours for 28 days. Indication:  MSSA bacteremia First Dose: No Last Day of Therapy:  10/02/2022 Labs - Once weekly:  CBC/D and BMP, Labs - Every other week:  ESR and CRP Method of administration: IV Push Method of administration may be changed at the discretion of home infusion pharmacist based upon assessment of the patient and/or caregiver's ability to self-administer the medication ordered.   insulin aspart 100 UNIT/ML injection Commonly known as: novoLOG Inject 0-15 Units into the skin 3 (three) times daily with meals. CBG 70 - 120: 0 units CBG 121 - 150: 2 units CBG 151 - 200: 3 units CBG 201 - 250: 5 units CBG 251 - 300: 8 units CBG 301 - 350: 11 units CBG 351 - 400: 15 units CBG > 400: call MD and obtain STAT lab verification   insulin glargine-yfgn 100 UNIT/ML injection Commonly known as: SEMGLEE Inject 0.2 mLs (20 Units total) into the skin at bedtime.   oxyCODONE 5 MG immediate release tablet Commonly known as: Oxy IR/ROXICODONE Take 1 tablet (5 mg total) by mouth every 6 (six) hours as needed for up to 7 days for  severe pain.   polyethylene glycol 17 g packet Commonly known as: MIRALAX / GLYCOLAX Take 17 g by mouth 2 (two) times daily as needed for mild constipation.   senna-docusate 8.6-50 MG tablet Commonly known as: Senokot-S Take 2 tablets by mouth 2 (two) times daily as needed for mild constipation.   tamsulosin 0.4 MG Caps capsule Commonly known as: FLOMAX Take 1  capsule (0.4 mg total) by mouth daily.               Discharge Care Instructions  (From admission, onward)           Start     Ordered   09/04/22 0000  Discharge wound care:       Comments: Wound care  Daily      Comments: Bedside RN to perform and teach family/patient as needed for discharge. Cleanse left toe amputation site with NS and pat dry. Apply adaptic to suture line for atraumatic dressing removal.  Cover with gauze and kerlix/tape  change daily.   09/04/22 0849   09/04/22 0000  Change dressing on IV access line weekly and PRN  (Home infusion instructions - [facility] )        09/04/22 1545            Consultations: Infectious disease Interventional radiology Urology  Procedures/Studies: 1/14-TURP and prostate abscess unroofing 1/18-drain placement for left psoas abscess 1/23-TEE without significant finding   CT ABDOMEN PELVIS W CONTRAST  Result Date: 09/04/2022 CLINICAL DATA:  Psoas muscle abscess EXAM: CT ABDOMEN AND PELVIS WITH CONTRAST TECHNIQUE: Multidetector CT imaging of the abdomen and pelvis was performed using the standard protocol following bolus administration of intravenous contrast. RADIATION DOSE REDUCTION: This exam was performed according to the departmental dose-optimization program which includes automated exposure control, adjustment of the mA and/or kV according to patient size and/or use of iterative reconstruction technique. CONTRAST:  100mL OMNIPAQUE IOHEXOL 300 MG/ML  SOLN COMPARISON:  CT AP, 08/28/2022.  IR CT, 08/22/2022. FINDINGS: Lower chest: No acute  abnormality. Sub-6 mm pulmonary nodules at the lung bases. Hepatobiliary: No focal liver abnormality is seen. No gallstones, gallbladder wall thickening, or biliary dilatation. Pancreas: No pancreatic ductal dilatation or surrounding inflammatory changes. Spleen: Normal in size without focal abnormality. Inferior pole accessory spleen Adrenals/Urinary Tract: Adrenal glands are unremarkable. Kidneys are normal, without renal calculi, focal lesion, or hydronephrosis. Bladder is unremarkable. Stomach/Bowel: Stomach is within normal limits. Nonobstructed small bowel. Pelvic appendix appears normal. Nondilated colon. Redundant sigmoid. Mild rectal distention of stool. No evidence of bowel wall thickening, distention, or inflammatory changes. Vascular/Lymphatic: No significant vascular findings are present. No enlarged abdominal or pelvic lymph nodes. Reproductive: Prostate is unremarkable. Other: 1. Retracted LEFT lower quadrant percutaneous drainage catheter, with pigtail located within the subcutaneous tissues. 2. Resolution of previously-demonstrated LEFT psoas abscess. 3. Interval decrease in size of RIGHT iliopsoas abscess, measuring approximately 2.5 x 2.5 cm previously 3.5 x 2.6 cm. Musculoskeletal: Obese with rotund abdomen and pannus. Scattered degenerative changes of the imaged spine. No acute or significant osseous findings. No follow-up is indicated for incidental findings, unless specifically mentioned. IMPRESSION: Since CT AP dated 08/28/2022; 1. Resolution of prior LEFT psoas abscess, though with interval retraction of now malpositioned LEFT lower quadrant drainage catheter. 2. Decrease in size of RIGHT iliopsoas abscess measuring 2.5 cm, previously 3.5 cm 3.  Additional incidental, chronic and senescent findings as above. Electronically Signed   By: Roanna BanningJon  Mugweru M.D.   On: 09/04/2022 16:55   US EKG Site Rite  Result Date: 09/02/2022 If Site Rite image not attached, placement could not be confirmed due  to current cardiac rhythm.  DG CHEST PORT 1 VIEW  Result Date: 09/01/2022 CLINICAL DATA:  PICC line placement EXAM: PORTABLE CHEST 1 VIEW COMPARISON:  08/29/2022 FINDINGS: Left PICC tip projecting over the location SVC/brachiocephalic confluence. Stable cardiomediastinal silhouette. No focal consolidation, pleural effusion, or  pneumothorax. IMPRESSION: Left PICC tip at the SVC/brachiocephalic confluence. Electronically Signed   By: Placido Sou M.D.   On: 09/01/2022 23:24   DG CHEST PORT 1 VIEW  Result Date: 08/29/2022 CLINICAL DATA:  PICC line placement EXAM: PORTABLE CHEST 1 VIEW COMPARISON:  None Available. FINDINGS: Right costophrenic angle excluded from view. Extreme lung apices excluded from view. Visualized lungs are clear. No definite pneumothorax or pleural effusion. Cardiac size within normal limits. Pulmonary vascularity is normal. Left upper extremity PICC line tip seen within the superior vena cava. No acute bone abnormality. IMPRESSION: 1. Left upper extremity PICC line tip within the superior vena cava. Electronically Signed   By: Fidela Salisbury M.D.   On: 08/29/2022 20:41   [place] EKG SITE RITE  Result Date: 08/29/2022 If Site Rite image not attached, placement could not be confirmed due to current cardiac rhythm.  [place] EKG SITE RITE  Result Date: 08/29/2022 If Site Rite image not attached, placement could not be confirmed due to current cardiac rhythm.  DG CHEST PORT 1 VIEW  Result Date: 08/29/2022 CLINICAL DATA:  PICC line placement. EXAM: PORTABLE CHEST 1 VIEW COMPARISON:  Radiographs 08/19/2022 and 08/13/2022. FINDINGS: 1453 hours. Three views are submitted. The central portion of a left arm PICC is not visualized beyond the level of the trachea, near the confluence of the brachiocephalic veins. The heart size and mediastinal contours are stable. The aeration of the lung bases has improved from the prior study. No evidence of pleural effusion or pneumothorax. No acute osseous  findings. IMPRESSION: The central portion of the left arm PICC is not well visualized and may terminate near the confluence of the brachiocephalic veins. Improved pulmonary aeration. Electronically Signed   By: Richardean Sale M.D.   On: 08/29/2022 15:52   CT ABDOMEN PELVIS W CONTRAST  Result Date: 08/28/2022 CLINICAL DATA:  Evaluate bilateral psoas muscle abscesses EXAM: CT ABDOMEN AND PELVIS WITH CONTRAST TECHNIQUE: Multidetector CT imaging of the abdomen and pelvis was performed using the standard protocol following bolus administration of intravenous contrast. RADIATION DOSE REDUCTION: This exam was performed according to the departmental dose-optimization program which includes automated exposure control, adjustment of the mA and/or kV according to patient size and/or use of iterative reconstruction technique. CONTRAST:  171mL OMNIPAQUE IOHEXOL 300 MG/ML  SOLN COMPARISON:  CT 08/21/2022, 08/16/2022, 08/06/2022 FINDINGS: Lower chest: No acute abnormality. Hepatobiliary: No focal liver abnormality is seen. No gallstones, gallbladder wall thickening, or biliary dilatation. Pancreas: Pancreatic atrophy without ductal dilatation or inflammatory changes. Spleen: Normal in size without focal abnormality. Adrenals/Urinary Tract: Unchanged renal gland thickening without well-defined nodule. Kidneys enhance symmetrically. No renal lesion, stone, or hydronephrosis. Small bubble of air within the urinary bladder, presumably related to prior instrumentation. Previously seen Foley catheter has been removed. Stomach/Bowel: Stomach is within normal limits. Appendix appears normal. No evidence of bowel wall thickening, distention, or inflammatory changes. Large volume of stool throughout the colon. Vascular/Lymphatic: No significant vascular findings are present. No enlarged abdominal or pelvic lymph nodes. Reproductive: Continued improvement in the appearance of the prostate gland following surgical unroofing. Small areas  of ill-defined low attenuation within the prostate apex, more prominent on the left (series 2, image 96). No rim enhancement is evident. Other: Interval placement of a percutaneous pigtail drainage catheter within the left psoas collection. Persistent but decreasing size of fluid collection in the left psoas muscle. Small foci of air are now present within the collection, likely related to recent drain placement. The right-sided psoas abscess  is not significantly changed in size compared to 08/21/2022 (series 2, image 65). No gas within the collection. No ascites. No new fluid collections. No pneumoperitoneum. Musculoskeletal: No new or acute bony findings. Degenerative disc disease at L3-4 and L4-5 has not appreciably changed. IMPRESSION: 1. Interval placement of a percutaneous pigtail drainage catheter within the left psoas collection. Persistent but decreasing size of fluid collection in the left psoas muscle. 2. The right-sided psoas abscess is not significantly changed in size compared to 08/21/2022. 3. Continued improvement in the appearance of the prostate gland following surgical unroofing. Small areas of ill-defined low attenuation within the prostate apex, more prominent on the left. 4. Large volume of stool throughout the colon. Electronically Signed   By: Duanne GuessNicholas  Plundo D.O.   On: 08/28/2022 10:01   ECHO TEE  Result Date: 08/27/2022    TRANSESOPHOGEAL ECHO REPORT   Patient Name:   William ShellingWilford [name] Date of Exam: 08/27/2022 Medical Rec #:  [id number]        Height:       76.0 in Accession #:    [id number]       Weight:       380.3 lb Date of Birth:  01/10/1961         BSA:          2.911 m Patient Age:    61 years         BP:           132/88 mmHg Patient Gender: M                HR:           85 bpm. Exam Location:  Inpatient Procedure: Transesophageal Echo, Cardiac Doppler and Color Doppler Indications:     Bacteremia  History:         Patient has no prior history of Echocardiogram examinations.                   Abnormal ECG, Arrythmias:Atrial Fibrillation,                  Signs/Symptoms:Bacteremia; Risk Factors:Dyslipidemia and                  Diabetes.  Sonographer:     Sheralyn Boatmanina West RDCS Referring Phys:  Chapman FitchXIKA [facility] Center[facility]HA[facility] Diagnosing Phys: Thurmon FairMihai Croitoru MD PROCEDURE: After discussion of the risks and benefits of a TEE, an informed consent was obtained from the patient. The transesophogeal probe was passed without difficulty through the esophogus of the patient. Imaged were obtained with the patient in a left lateral decubitus and supine position. Sedation performed by different physician. The patient was monitored while under deep sedation. Anesthestetic sedation was provided intravenously by Anesthesiology: 260mg  of Propofol. The patient's vital signs;  including heart rate, blood pressure, and oxygen saturation; remained stable throughout the procedure. The patient developed no complications during the procedure.  IMPRESSIONS  1. Left ventricular ejection fraction, by estimation, is 60 to 65%. The left ventricle has normal function. The left ventricle has no regional wall motion abnormalities. There is mild concentric left ventricular hypertrophy.  2. Right ventricular systolic function is normal. The right ventricular size is normal.  3. No left atrial/left atrial appendage thrombus was detected.  4. The mitral valve is normal in structure. Trivial mitral valve regurgitation. No evidence of mitral stenosis.  5. The aortic valve is normal in structure. Aortic valve regurgitation is not visualized. No aortic stenosis is present.  6. The inferior  vena cava is normal in size with greater than 50% respiratory variability, suggesting right atrial pressure of 3 mmHg. Conclusion(s)/Recommendation(s): Normal biventricular function without evidence of hemodynamically significant valvular heart disease. FINDINGS  Left Ventricle: Left ventricular ejection fraction, by estimation, is 60 to 65%. The left ventricle has normal  function. The left ventricle has no regional wall motion abnormalities. The left ventricular internal cavity size was normal in size. There is  mild concentric left ventricular hypertrophy. Right Ventricle: The right ventricular size is normal. No increase in right ventricular wall thickness. Right ventricular systolic function is normal. Left Atrium: Left atrial size was normal in size. No left atrial/left atrial appendage thrombus was detected. Right Atrium: Right atrial size was normal in size. Pericardium: There is no evidence of pericardial effusion. Mitral Valve: The mitral valve is normal in structure. Trivial mitral valve regurgitation. No evidence of mitral valve stenosis. Tricuspid Valve: The tricuspid valve is normal in structure. Tricuspid valve regurgitation is not demonstrated. No evidence of tricuspid stenosis. Aortic Valve: The aortic valve is normal in structure. Aortic valve regurgitation is not visualized. No aortic stenosis is present. Pulmonic Valve: The pulmonic valve was normal in structure. Pulmonic valve regurgitation is not visualized. No evidence of pulmonic stenosis. Aorta: The aortic root is normal in size and structure. Venous: The inferior vena cava is normal in size with greater than 50% respiratory variability, suggesting right atrial pressure of 3 mmHg. IAS/Shunts: No atrial level shunt detected by color flow Doppler. Additional Comments: Spectral Doppler performed. Thurmon Fair MD Electronically signed by Thurmon Fair MD Signature Date/Time: 08/27/2022/3:12:17 PM    Final    CT GUIDED PERITONEAL/RETROPERITONEAL FLUID DRAIN BY PERC CATH  Result Date: 08/23/2022 INDICATION: 62 year old with history of prostate abscess. Bilateral psoas fluid collections are concerning for abscesses. EXAM: 1. CT-guided placement of drainage catheter in left psoas abscess 2. CT-guided aspiration of right psoas muscle MEDICATIONS: Moderate sedation ANESTHESIA/SEDATION: Moderate (conscious)  sedation was employed during this procedure. A total of Versed 3mg  and fentanyl 150 mcg was administered intravenously at the order of the provider performing the procedure. Total intra-service moderate sedation time: 61 minutes. Patient's level of consciousness and vital signs were monitored continuously by radiology nurse throughout the procedure under the supervision of the provider performing the procedure. COMPLICATIONS: None immediate. PROCEDURE: Informed written consent was obtained from the patient after a thorough discussion of the procedural risks, benefits and alternatives. All questions were addressed. Maximal Sterile Barrier Technique was utilized including caps, mask, sterile gowns, sterile gloves, sterile drape, hand hygiene and skin antiseptic. A timeout was performed prior to the initiation of the procedure. Patient was placed supine on the CT table and the left side was slightly elevated. CT images through the lower abdomen and pelvis were identified. The abnormal left psoas muscle was identified. The left lower quadrant of the abdomen was prepped with chlorhexidine and sterile field was created. Skin was anesthetized using 1% lidocaine. Using CT guidance, an 18 gauge trocar needle was directed into the lateral aspect of the left psoas muscle. Bloody purulent fluid was aspirated. Superstiff Amplatz wire was advanced into the muscle. The tract was dilated to accommodate a 10 drain. 20 mL of bloody purulent fluid was removed. Follow up CT images were obtained. Drain was attached to a suction bulb and sutured to skin. Fluid was sent for culture. A dressing was placed. Attention was directed to the right psoas fluid collection. Patient was slightly repositioned with the right side mildly elevated. Additional CT  images were obtained of the abdomen and pelvis. The fluid collection was identified in the superior aspect of the psoas muscle but there was not a percutaneous window to access this area.  It was not clear if this fluid was tracking along the inferior aspect of the psoas muscle. Plan was to aspirate this area to see if there is a connection with the more superior fluid collection. The right lateral abdomen was prepped and draped in sterile fashion. Skin was anesthetized using 1% lidocaine. Using CT guidance, 18 gauge trocar needle was directed into the right psoas muscle but only a small amount of bloody fluid could be aspirated. No purulent fluid was aspirated. As a result, this needle was removed. RADIATION DOSE REDUCTION: This exam was performed according to the departmental dose-optimization program which includes automated exposure control, adjustment of the mA and/or kV according to patient size and/or use of iterative reconstruction technique. FINDINGS: Complex left psoas abscess. Drain was placed in the more lateral aspect of the abscess and 20 mL of bloody purulent fluid was obtained. Another component to the abscess contains gas along the medial aspect of the left psoas. This collection was not immediately decompressed following drain placement. Low density collection involving the superomedial aspect of the right psoas muscle. No percutaneous window to access the superomedial aspect of the right psoas muscle. Needle was directed into the inferior aspect of the right psoas muscle but no purulent fluid could be aspirated from this area. IMPRESSION: 1. CT-guided placement of a drainage catheter within the left psoas abscess collection. Left psoas abscess collection appears to be complex or there is a second collection. Patient will need follow-up imaging to follow these left psoas collections. 2. The low-density collection along the superomedial aspect of the right psoas muscle could not be accessed. The inferior aspect of the right psoas muscle was aspirated but no purulent fluid was obtained. Electronically Signed   By: Richarda Overlie M.D.   On: 08/23/2022 09:07   CT ASPIRATION N/S  Result  Date: 08/23/2022 INDICATION: 62 year old with history of prostate abscess. Bilateral psoas fluid collections are concerning for abscesses. EXAM: 1. CT-guided placement of drainage catheter in left psoas abscess 2. CT-guided aspiration of right psoas muscle MEDICATIONS: Moderate sedation ANESTHESIA/SEDATION: Moderate (conscious) sedation was employed during this procedure. A total of Versed 3mg  and fentanyl 150 mcg was administered intravenously at the order of the provider performing the procedure. Total intra-service moderate sedation time: 61 minutes. Patient's level of consciousness and vital signs were monitored continuously by radiology nurse throughout the procedure under the supervision of the provider performing the procedure. COMPLICATIONS: None immediate. PROCEDURE: Informed written consent was obtained from the patient after a thorough discussion of the procedural risks, benefits and alternatives. All questions were addressed. Maximal Sterile Barrier Technique was utilized including caps, mask, sterile gowns, sterile gloves, sterile drape, hand hygiene and skin antiseptic. A timeout was performed prior to the initiation of the procedure. Patient was placed supine on the CT table and the left side was slightly elevated. CT images through the lower abdomen and pelvis were identified. The abnormal left psoas muscle was identified. The left lower quadrant of the abdomen was prepped with chlorhexidine and sterile field was created. Skin was anesthetized using 1% lidocaine. Using CT guidance, an 18 gauge trocar needle was directed into the lateral aspect of the left psoas muscle. Bloody purulent fluid was aspirated. Superstiff Amplatz wire was advanced into the muscle. The tract was dilated to accommodate a 10  drain. 20 mL of bloody purulent fluid was removed. Follow up CT images were obtained. Drain was attached to a suction bulb and sutured to skin. Fluid was sent for culture. A dressing was placed.  Attention was directed to the right psoas fluid collection. Patient was slightly repositioned with the right side mildly elevated. Additional CT images were obtained of the abdomen and pelvis. The fluid collection was identified in the superior aspect of the psoas muscle but there was not a percutaneous window to access this area. It was not clear if this fluid was tracking along the inferior aspect of the psoas muscle. Plan was to aspirate this area to see if there is a connection with the more superior fluid collection. The right lateral abdomen was prepped and draped in sterile fashion. Skin was anesthetized using 1% lidocaine. Using CT guidance, 18 gauge trocar needle was directed into the right psoas muscle but only a small amount of bloody fluid could be aspirated. No purulent fluid was aspirated. As a result, this needle was removed. RADIATION DOSE REDUCTION: This exam was performed according to the departmental dose-optimization program which includes automated exposure control, adjustment of the mA and/or kV according to patient size and/or use of iterative reconstruction technique. FINDINGS: Complex left psoas abscess. Drain was placed in the more lateral aspect of the abscess and 20 mL of bloody purulent fluid was obtained. Another component to the abscess contains gas along the medial aspect of the left psoas. This collection was not immediately decompressed following drain placement. Low density collection involving the superomedial aspect of the right psoas muscle. No percutaneous window to access the superomedial aspect of the right psoas muscle. Needle was directed into the inferior aspect of the right psoas muscle but no purulent fluid could be aspirated from this area. IMPRESSION: 1. CT-guided placement of a drainage catheter within the left psoas abscess collection. Left psoas abscess collection appears to be complex or there is a second collection. Patient will need follow-up imaging to follow  these left psoas collections. 2. The low-density collection along the superomedial aspect of the right psoas muscle could not be accessed. The inferior aspect of the right psoas muscle was aspirated but no purulent fluid was obtained. Electronically Signed   By: Richarda Overlie M.D.   On: 08/23/2022 09:07   [place] EKG SITE RITE  Result Date: 08/22/2022 If Site Rite image not attached, placement could not be confirmed due to current cardiac rhythm.  CT ABDOMEN PELVIS W CONTRAST  Result Date: 08/21/2022 CLINICAL DATA:  Left lower quadrant and flank pain and known bilateral intramuscular iliopsoas abscesses and status post surgical unroofing bilateral prostate abscesses on 08/18/2022. EXAM: CT ABDOMEN AND PELVIS WITH CONTRAST TECHNIQUE: Multidetector CT imaging of the abdomen and pelvis was performed using the standard protocol following bolus administration of intravenous contrast. RADIATION DOSE REDUCTION: This exam was performed according to the departmental dose-optimization program which includes automated exposure control, adjustment of the mA and/or kV according to patient size and/or use of iterative reconstruction technique. CONTRAST:  OMNIPAQUE IOHEXOL 300 MG/ML  SOLN COMPARISON:  Prior CT of the abdomen and pelvis on 08/17/2022 FINDINGS: Lower chest: No acute abnormality. Hepatobiliary: No focal liver abnormality is seen. No gallstones, gallbladder wall thickening, or biliary dilatation. Pancreas: Stable atrophic pancreas without inflammation. Spleen: Normal in size without focal abnormality. Adrenals/Urinary Tract: Adrenal glands are unremarkable. Kidneys are normal, without renal calculi, focal lesion, or hydronephrosis. Bladder is decompressed by a Foley catheter. Stomach/Bowel: Bowel shows no  evidence of obstruction, ileus, inflammation or lesion. The appendix is not visualized. No free intraperitoneal air. Vascular/Lymphatic: No vascular findings. No lymphadenopathy identified in the abdomen or  pelvis. Reproductive: Prostate gland shows significant improvement after unroofing with near resolution of abscesses. There remains a small left sided intra prostate fluid collection measuring up to 1.9 cm in transverse diameter. Other collections appear resolved. Other: Bilateral intramuscular ileo psoas collections remain. The elongated right-sided collection measures approximately 4.2 x 3.0 cm in greatest transverse dimensions at the level of the S1 vertebral body which is likely slightly larger compared to comparable measurements obtained currently 3.8 x 2.9 cm at the same level. At least 2 separate left-sided iliopsoas collections remain with the largest measuring roughly 4.1 cm in greatest diameter at the level of the mid sacrum. This collection appeared similar in size measuring approximately 4 cm at the same level on the prior study. Superiorly the collection may be slightly larger in diameter measuring 2.5 cm at the level of the L5 vertebral body compared to 1.9 cm previously. Musculoskeletal: No fractures or bony destruction. Stable degenerative disc disease in the lower lumbar spine. IMPRESSION: 1. Significant improvement in the appearance of the prostate gland after surgical unroofing with near resolution of abscesses. There remains a small left-sided intra prostate fluid collection measuring up to 1.9 cm in transverse diameter. 2. Bilateral iliopsoas fluid collections remain. The right-sided collection is likely slightly larger compared to the prior study. The left-sided iliopsoas collection is similar to slightly enlarged compared to the prior study. Electronically Signed   By: Aletta Edouard M.D.   On: 08/21/2022 11:16   DG CHEST PORT 1 VIEW  Result Date: 08/19/2022 CLINICAL DATA:  Central line placement EXAM: PORTABLE CHEST 1 VIEW COMPARISON:  08/13/2022 FINDINGS: The heart size and mediastinal contours are within normal limits. Unchanged appearance of a right neck vascular catheter, tip over  the lower SVC. Diffuse bilateral interstitial pulmonary opacity. The visualized skeletal structures are unremarkable. IMPRESSION: 1. Unchanged appearance of a right neck vascular catheter, tip over the lower SVC. 2. Diffuse bilateral interstitial pulmonary opacity, consistent with edema or atypical/viral infection. No new or focal airspace opacity. Electronically Signed   By: Delanna Ahmadi M.D.   On: 08/19/2022 15:50   CT ABDOMEN PELVIS W WO CONTRAST  Result Date: 08/18/2022 CLINICAL DATA:  62 year old male history of prostate abscess. Psoas abscess. EXAM: CT ABDOMEN AND PELVIS WITHOUT AND WITH CONTRAST TECHNIQUE: Multidetector CT imaging of the abdomen and pelvis was performed following the standard protocol before and following the bolus administration of intravenous contrast. RADIATION DOSE REDUCTION: This exam was performed according to the departmental dose-optimization program which includes automated exposure control, adjustment of the mA and/or kV according to patient size and/or use of iterative reconstruction technique. CONTRAST:  145mL OMNIPAQUE IOHEXOL 300 MG/ML  SOLN COMPARISON:  CT of the abdomen and pelvis without contrast 08/06/2022. FINDINGS: Comment: Suboptimal contrast bolus slightly limits today's examination. Lower chest: Tip of central venous catheter at superior cavoatrial junction. Hepatobiliary: Diffuse low attenuation throughout the hepatic parenchyma, indicative of a background of severe hepatic steatosis. No suspicious cystic or solid hepatic lesions. No intra or extrahepatic biliary ductal dilatation. Gallbladder is unremarkable in appearance. Pancreas: Diffuse pancreatic atrophy. No pancreatic mass. No pancreatic ductal dilatation. No pancreatic or peripancreatic fluid collections or inflammatory changes. Spleen: Unremarkable. Adrenals/Urinary Tract: Bilateral kidneys and adrenal glands are normal in appearance. No hydroureteronephrosis. Urinary bladder is nearly completely  decompressed around an indwelling Foley balloon catheter. Small amount  of gas within the lumen of the urinary bladder is presumably iatrogenic. Stomach/Bowel: The appearance of the stomach is unremarkable. No pathologic dilatation of small bowel or colon. Diffuse scattered colonic diverticuli are noted without surrounding inflammatory changes to indicate an acute diverticulitis at this time. Normal appendix. Vascular/Lymphatic: No atherosclerotic calcifications are noted in the abdominal aorta or pelvic vasculature. No definite lymphadenopathy noted in the abdomen or pelvis. Reproductive: Prostate gland is massively enlarged and very heterogeneous in appearance. There are enlarging low-attenuation regions in the prostate gland, largest of which is on the left side (axial image 97 of series 4) measuring 4.6 x 4.1 cm, concerning for multifocal prostatic abscesses. Seminal vesicles are unremarkable in appearance. Other: No significant volume of ascites.  No pneumoperitoneum. Musculoskeletal: Compared to the prior examination there are new space-occupying areas of low attenuation in the psoas musculature bilaterally, concerning for psoas abscesses (poorly evaluated secondary to suboptimal contrast bolus), well appreciated on axial image 72 of series 4, particularly when narrowing the window settings. The largest of these is on the right side (axial image 72 of series 4 and coronal image 68 of series 8) estimated to measure approximately 3.2 x 2.8 x 6.8 cm. IMPRESSION: 1. Massively enlarged and heterogeneous appearing prostate gland with multiple rim enhancing low-attenuation areas, presumably prostatic abscesses, as detailed above. 2. Interval development of space-occupying low-attenuation regions in the psoas musculature bilaterally, concerning for psoas abscesses. 3. Hepatic steatosis. 4. Mild colonic diverticulosis without evidence of acute diverticulitis at this time. Electronically Signed   By: Trudie Reed  M.D.   On: 08/18/2022 08:53   CT L-SPINE NO CHARGE  Result Date: 08/06/2022 CLINICAL DATA:  Left flank pain. EXAM: CT LUMBAR SPINE WITHOUT CONTRAST TECHNIQUE: Multidetector CT imaging of the lumbar spine was performed without intravenous contrast administration. Multiplanar CT image reconstructions were also generated. RADIATION DOSE REDUCTION: This exam was performed according to the departmental dose-optimization program which includes automated exposure control, adjustment of the mA and/or kV according to patient size and/or use of iterative reconstruction technique. COMPARISON:  None Available. FINDINGS: Segmentation: Assuming 12 rib-bearing thoracic vertebral bodies, there is transitional lumbosacral anatomy with incomplete sacralization of L5. Alignment: Straightening of the normal lumbar lordosis. Trace stepwise retrolisthesis from L1-L2 through L4-L5. Vertebrae: No acute fracture or focal pathologic process. Small Schmorl's nodes at L3-L4 and L4-L5. Paraspinal and other soft tissues: Please see separate CT abdomen and pelvis report from same day. Disc levels: T12-L1: Minimal disc bulging. Moderate right and mild left facet arthropathy. No stenosis. L1-L2: Mild disc bulging and endplate spurring. Mild right facet arthropathy. No stenosis. L2-L3: Mild disc bulging and endplate spurring. Mild left facet arthropathy. No stenosis. L3-L4: Mild disc bulging and endplate spurring with left far lateral disc osteophyte complex. Mild bilateral facet arthropathy. Moderate to severe left and moderate right neuroforaminal stenosis. No spinal canal stenosis. L4-L5: Circumferential disc osteophyte complex. Mild-to-moderate bilateral facet arthropathy. Severe bilateral neuroforaminal stenosis. No spinal canal stenosis. L5-S1: Transitional level. Negative disc. Mild bilateral facet arthropathy. No stenosis. IMPRESSION: 1. 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 Correlation with radiographs is  recommended prior to any operative intervention. 2. Lumbar spondylosis as described above. Severe bilateral neuroforaminal stenosis at L4-L5. 3. Moderate to severe left and moderate right neuroforaminal stenosis at L3-L4. Electronically Signed   By: Obie Dredge M.D.   On: 08/06/2022 11:18   CT Renal Stone Study  Result Date: 08/06/2022 CLINICAL DATA:  Left flank pain radiating to the mid back. Denies hematuria or  history of kidney stones. EXAM: CT ABDOMEN AND PELVIS WITHOUT CONTRAST TECHNIQUE: Multidetector CT imaging of the abdomen and pelvis was performed following the standard protocol without IV contrast. RADIATION DOSE REDUCTION: This exam was performed according to the departmental dose-optimization program which includes automated exposure control, adjustment of the mA and/or kV according to patient size and/or use of iterative reconstruction technique. COMPARISON:  None Available. FINDINGS: Lower chest: No acute abnormality. Hepatobiliary: Mild diffusely decreased liver density without focal abnormality. The gallbladder is unremarkable. No biliary dilatation. Pancreas: Fatty atrophy. No ductal dilatation or surrounding inflammatory changes. Spleen: Mildly enlarged.  No focal abnormality. Adrenals/Urinary Tract: Adrenal glands are unremarkable. Kidneys are normal, without renal calculi, focal lesion, or hydronephrosis. Bladder is unremarkable. Stomach/Bowel: Stomach is within normal limits. Appendix appears normal. No evidence of bowel wall thickening, distention, or inflammatory changes. Vascular/Lymphatic: No significant vascular findings are present. No enlarged abdominal or pelvic lymph nodes. Reproductive: Enlarged prostate gland appears hypodense, although this may be accentuated by beam hardening artifact. Other: No abdominal wall hernia or abnormality. No abdominopelvic ascites. No pneumoperitoneum. Musculoskeletal: No acute or significant osseous findings. IMPRESSION: 1. No acute  intra-abdominal process. No urolithiasis. 2. Mild hepatic steatosis. 3. Mild splenomegaly. 4. Enlarged prostate gland appears hypodense, although this may be accentuated by beam hardening artifact. Correlate for prostatitis. Electronically Signed   By: Obie Dredge M.D.   On: 08/06/2022 11:11       The results of significant diagnostics from this hospitalization (including imaging, microbiology, ancillary and laboratory) are listed below for reference.     Microbiology: No results found for this or any previous visit (from the past 240 hour(s)).   Labs:  CBC: Recent Labs  Lab 09/01/22 0550 09/05/22 0508  WBC 7.7 7.7  HGB 10.2* 10.8*  HCT 32.2* 34.6*  MCV 83.6 84.8  PLT 245 356   BMP &GFR Recent Labs  Lab 09/01/22 0550 09/05/22 0508  NA 132* 132*  K 3.9 3.7  CL 98 99  CO2 26 24  GLUCOSE 172* 238*  BUN 12 12  CREATININE 0.70 0.69  CALCIUM 8.2* 8.3*  MG  --  1.8  PHOS  --  3.2   Estimated Creatinine Clearance: 166.9 mL/min (by C-G formula based on SCr of 0.69 mg/dL). Liver & Pancreas: Recent Labs  Lab 09/05/22 0508  ALBUMIN 2.5*   No results for input(s): "LIPASE", "AMYLASE" in the last 168 hours. No results for input(s): "AMMONIA" in the last 168 hours. Diabetic: No results for input(s): "HGBA1C" in the last 72 hours. Recent Labs  Lab 09/04/22 0742 09/04/22 1129 09/04/22 1628 09/04/22 2118 09/05/22 0739  GLUCAP 188* 162* 134* 99 195*   Cardiac Enzymes: No results for input(s): "CKTOTAL", "CKMB", "CKMBINDEX", "TROPONINI" in the last 168 hours. No results for input(s): "PROBNP" in the last 8760 hours. Coagulation Profile: No results for input(s): "INR", "PROTIME" in the last 168 hours. Thyroid Function Tests: No results for input(s): "TSH", "T4TOTAL", "FREET4", "T3FREE", "THYROIDAB" in the last 72 hours. Lipid Profile: Recent Labs    09/05/22 0508  CHOL 131  HDL 24*  LDLCALC 85  TRIG 751  CHOLHDL 5.5   Anemia Panel: No results for input(s):  "VITAMINB12", "FOLATE", "FERRITIN", "TIBC", "IRON", "RETICCTPCT" in the last 72 hours. Urine analysis:    Component Value Date/Time   COLORURINE YELLOW 08/06/2022 1255   APPEARANCEUR CLEAR 08/06/2022 1255   LABSPEC 1.023 08/06/2022 1255   PHURINE 6.0 08/06/2022 1255   GLUCOSEU >=500 (A) 08/06/2022 1255   HGBUR SMALL (  A) 08/06/2022 1255   BILIRUBINUR NEGATIVE 08/06/2022 1255   KETONESUR 20 (A) 08/06/2022 1255   PROTEINUR 30 (A) 08/06/2022 1255   NITRITE NEGATIVE 08/06/2022 1255   LEUKOCYTESUR NEGATIVE 08/06/2022 1255   Sepsis Labs: Invalid input(s): "PROCALCITONIN", "LACTICIDVEN"   SIGNED:  Almon Hercules, MD  [facility] 09/05/2022, 9:54 AM

## 2022-09-05 NOTE — TOC Progression Note (Signed)
Transition of Care [facility]) - Progression Note    Patient Details  Name: William [name] MRN: [id number] Date of Birth: [birth year]  [facility] CM/SW [place], RN Phone [phone]  09/05/2022, 1:47 PM  Clinical Narrative:    CM received message from [facility] admissions director at [facility]. Per Kia the facility ordered 2 bari beds but they have not arrived yet. Kia will update CM when bed arrives and facility can accept patient.    Expected Discharge Plan: [place] Barriers to Discharge: Continued Medical Work up  Expected Discharge Plan and Services   Discharge Planning Services: CM Consult   Living arrangements for the past 2 months: Single Family Home Expected Discharge Date: 09/05/22                                     Social Determinants of Health (SDOH) Interventions SDOH Screenings   Food Insecurity: No Food Insecurity (08/28/2022)  Housing: Low Risk  (08/28/2022)  Transportation Needs: No Transportation Needs (08/28/2022)  Utilities: Not At Risk (08/28/2022)  Tobacco Use: Low Risk  (08/27/2022)    Readmission Risk Interventions    09/03/2022    3:40 PM  Readmission Risk Prevention Plan  Transportation Screening Complete  PCP or Specialist Appt within 5-7 Days Complete

## 2022-09-06 ENCOUNTER — Encounter: Payer: Self-pay | Admitting: Urology

## 2022-09-06 ENCOUNTER — Ambulatory Visit: Payer: Commercial Managed Care - PPO | Admitting: Urology

## 2022-09-06 DIAGNOSIS — R7881 Bacteremia: Secondary | ICD-10-CM | POA: Diagnosis not present

## 2022-09-06 DIAGNOSIS — D649 Anemia, unspecified: Secondary | ICD-10-CM | POA: Diagnosis not present

## 2022-09-06 DIAGNOSIS — M86072 Acute hematogenous osteomyelitis, left ankle and foot: Secondary | ICD-10-CM | POA: Diagnosis not present

## 2022-09-06 DIAGNOSIS — E1165 Type 2 diabetes mellitus with hyperglycemia: Secondary | ICD-10-CM | POA: Diagnosis not present

## 2022-09-06 LAB — GLUCOSE, CAPILLARY
Glucose-Capillary: 140 mg/dL — ABNORMAL HIGH (ref 70–99)
Glucose-Capillary: 97 mg/dL (ref 70–99)

## 2022-09-06 NOTE — TOC Transition Note (Signed)
Transition of Care [facility]) - CM/SW Discharge Note   Patient Details  Name: William [name] MRN: [id number] Date of Birth: 1961/05/14  Transition of [facility] CM/SW Contact:  Angelita Ingles, RN Phone [phone]  09/06/2022, 11:19 AM   Clinical Narrative:    [facility] is able to accept patient today. Transportation has been set up via [employer]. Discharge packet on chart. [name] has been updated.  Please call report to  [facility] [phone] Room 123B     Barriers to Discharge: Continued Medical Work up   Patient Goals and CMS Choice   Choice offered to / list presented to : Patient  Discharge Placement                         Discharge Plan and Services Additional resources added to the After Visit Summary for     Discharge Planning Services: CM Consult                                 Social Determinants of Health (SDOH) Interventions SDOH Screenings   Food Insecurity: No Food Insecurity (08/28/2022)  Housing: Low Risk  (08/28/2022)  Transportation Needs: No Transportation Needs (08/28/2022)  Utilities: Not At Risk (08/28/2022)  Tobacco Use: Low Risk  (08/27/2022)     Readmission Risk Interventions    09/03/2022    3:40 PM  Readmission Risk Prevention Plan  Transportation Screening Complete  PCP or Specialist Appt within 5-7 Days Complete

## 2022-09-06 NOTE — Discharge Summary (Signed)
Physician Discharge Summary  William [name] TDD:[id number] DOB: March 06, 1961 DOA: 08/16/2022  PCP: Pcp, No  Admit date: 08/16/2022 Discharge date: 09/06/2022 Admitted From: Home Disposition: SNF Recommendations for Outpatient Follow-up:  Follow up with infectious disease as below Check BMP and CBC with differential weekly Check CRP and ESR biweekly Please follow up on the following pending results: None   Discharge Condition: Stable CODE STATUS: Full code  Follow-up Information     Danelle Earthly, MD Follow up on 09/10/2022.   Specialty: Infectious Diseases Contact information: [address], [address] [place] [address] [phone]                 Hospital course 62 year old M with PMH of DM-2, HTN, osteomyelitis of left great toe s/p amputation at [facility], hypothyroidism, HLD, recent diagnosis of prostatitis, severe OSA not on CPAP and morbid obesity who was transferred here from [facility] after he presented with left flank pain in the setting of prostatic abscess and bilateral psoas abscess.  Blood culture at [facility] with MSSA.  Patient was started on IV antibiotics.  Urology, IR and ID consulted.  He underwent turbid with unroofing of prostate abscess.  Had drain placement for left psoas abscess on 1/18.  Blood culture on 1/13 NGTD.  TEE without vegetation.  Repeat CT on 1/31 with resolution of left psoas abscess and decreased in size of right psoas abscess from 3.5-2.5 cm.  Drain removed on 1/31.  ID recommended IV Ancef for 6 weeks, with end date on 10/02/2022.  Outpatient follow-up with ID as above.  Therapy recommended SNF.   See individual problem list below for more.   Problems addressed during this hospitalization Principal Problem:   MSSA bacteremia Active Problems:   Prostatic abscess   Possible psoas muscle infection   Osteomyelitis of left foot (HCC)   Hyperthyroidism   Hypercholesteremia   Morbid obesity (HCC)   Normocytic anemia   Paroxysmal  atrial fibrillation (HCC)   Severe obstructive sleep apnea   Type 2 diabetes mellitus (HCC)   Hypokalemia   Hyponatremia   Malnutrition of moderate degree   MSSA bacteremia: Blood cultures at [facility] with MSSA.  Repeat blood culture on 1/13 NGTD.  TTE without vegetation.  Source of infection felt to be left foot osteomyelitis.  -ID recommended antibiotics, IV Ancef for total of 6 weeks, until 10/02/2022.  PICC line in place. -BMP and CBC with differential weekly -CRP and ESR biweekly -Outpatient follow-up with ID -Final disposition SNF.   Bilateral psoas fluid collection:  -S/p aspiration and drain placement for left psoas abscess from 1/18-1/31. -Repeat CT on on 1/31 with resolution of left psoas abscess and decreased size of right psoas abscess from 3.5 to 2.5 cm -Antibiotics as above.   Prostatic abscess:  -Status post TURP, posterior abscess unroofing on 1/14 by Dr. [name].  Foley discontinued -Antibiotics as above.   Left foot osteomyelitis:  -S/p left great toe partial amputation on 1/11 at [facility].   -As per podiatry,leave sutures in place for 1 month, removal in February 11.   -Follow-up with Dr. [name] in [place] in a month. -Wound care as below.   Uncontrolled NIDDM-2 with hyperglycemia:: A1c 12.9%.  Does not seem to be on medication at home. -Lantus and sliding scale insulin as below -Lipitor 20 mg daily   Paroxysmal A-fib: New onset on admission.  Converted to NSR.  CHA2DS2-Vasc 1.    -Anticoagulation deferred as per above.    OSA not on CPAP:  Hypokalemia/hyponatremia: Resolved   Morbid obesity/moderate malnutrition Nutrition Problem: Moderate Malnutrition Etiology: acute illness (osteomyelitis) Signs/Symptoms: mild fat depletion, mild muscle depletion, energy intake < 75% for > 7 days Interventions: Premier Protein, MVI, Education  Pressure skin injury: POA Pressure Injury 08/25/22 Buttocks Medial;Mid Stage 2 -  Partial thickness loss of dermis  presenting as a shallow open injury with a red, pink wound bed without slough. Circular, pink (Active)  08/25/22 1400  Location: Buttocks  Location Orientation: Medial;Mid  Staging: Stage 2 -  Partial thickness loss of dermis presenting as a shallow open injury with a red, pink wound bed without slough.  Wound Description (Comments): Circular, pink  Present on Admission:   Dressing Type Foam - Lift dressing to assess site every shift 09/06/22 0804     Pressure Injury 08/25/22 Ischial tuberosity Right Stage 2 -  Partial thickness loss of dermis presenting as a shallow open injury with a red, pink wound bed without slough. Circular, pink (Active)  08/25/22 1400  Location: Ischial tuberosity  Location Orientation: Right  Staging: Stage 2 -  Partial thickness loss of dermis presenting as a shallow open injury with a red, pink wound bed without slough.  Wound Description (Comments): Circular, pink  Present on Admission:   Dressing Type Foam - Lift dressing to assess site every shift 09/06/22 0804     Pressure Injury 08/25/22 Buttocks Right Stage 2 -  Partial thickness loss of dermis presenting as a shallow open injury with a red, pink wound bed without slough. Circula, pink (Active)  08/25/22 1400  Location: Buttocks  Location Orientation: Right  Staging: Stage 2 -  Partial thickness loss of dermis presenting as a shallow open injury with a red, pink wound bed without slough.  Wound Description (Comments): Circula, pink  Present on Admission:   Dressing Type Foam - Lift dressing to assess site every shift 09/06/22 0804    Vital signs Vitals:   09/05/22 0447 09/05/22 1349 09/05/22 2118 09/06/22 0639  BP: 136/86 137/80 113/66 (!) 136/98  Pulse: (!) 101 85 (!) 108 92  Temp: 97.8 F (36.6 C) 99.1 F (37.3 C) 98.4 F (36.9 C) 97.8 F (36.6 C)  Resp: 18 20 18 18   Height:      Weight:      SpO2: 98% 99% 94% 97%  TempSrc: Oral Oral Oral Oral  BMI (Calculated):         Discharge  exam  GENERAL: No apparent distress.  Nontoxic. HEENT: MMM.  Vision and hearing grossly intact.  NECK: Supple.  No apparent JVD.  RESP:  No IWOB.  Fair aeration bilaterally. CVS:  RRR. Heart sounds normal.  ABD/GI/GU: BS+. Abd soft, NTND.  MSK/EXT:  Moves extremities.  Dressing and Ace wrap over left foot DCI. SKIN: no apparent skin lesion or wound NEURO: Awake and alert. Oriented appropriately.  No apparent focal neuro deficit. PSYCH: Calm. Normal affect.   Discharge Instructions Discharge Instructions     Change dressing on IV access line weekly and PRN   Complete by: As directed    Diet - low sodium heart healthy   Complete by: As directed    Discharge wound care:   Complete by: As directed    Wound care  Daily      Comments: Bedside RN to perform and teach family/patient as needed for discharge. Cleanse left toe amputation site with NS and pat dry. Apply adaptic to suture line for atraumatic dressing removal.  Cover with gauze and kerlix/tape  change  daily.   Flush IV access with Sodium Chloride 0.9% and Heparin 10 units/ml or 100 units/ml   Complete by: As directed    Home infusion instructions - [facility]   Complete by: As directed    Instructions: Flush IV access with Sodium Chloride 0.9% and Heparin 10units/ml or 100units/ml   Change dressing on IV access line: Weekly and PRN   Instructions Cath Flo 2mg : Administer for PICC Line occlusion and as ordered by physician for other access device   [facility] pharmacist to adjust dose for: Vancomycin, Aminoglycosides and other anti-infective therapies as requested by physician   Increase activity slowly   Complete by: As directed    Method of administration may be changed at the discretion of home infusion pharmacist based upon assessment of the patient and/or caregiver's ability to self-administer the medication ordered   Complete by: As directed    Outpatient Parenteral Antibiotic Therapy Information  Antibiotic: Cefazolin (Ancef) IVPB; Indications for use: MSSA bacteremia; End Date: 10/02/2022   Complete by: As directed    Antibiotic: Cefazolin (Ancef) IVPB   Indications for use: MSSA bacteremia   End Date: 10/02/2022      Allergies as of 09/06/2022       Reactions   Diltiazem Hcl Other (See Comments)   Causes accelerated heart rate        Medication List     STOP taking these medications    ciprofloxacin 500 MG tablet Commonly known as: CIPRO   meloxicam 7.5 MG tablet Commonly known as: Mobic       TAKE these medications    albuterol 1.25 MG/3ML nebulizer solution Commonly known as: ACCUNEB Take 1 ampule by nebulization every 6 (six) hours as needed for shortness of breath or wheezing.   atorvastatin 20 MG tablet Commonly known as: LIPITOR Take 1 tablet (20 mg total) by mouth daily.   ceFAZolin  IVPB Commonly known as: ANCEF Inject 2 g into the vein every 8 (eight) hours for 28 days. Indication:  MSSA bacteremia First Dose: No Last Day of Therapy:  10/02/2022 Labs - Once weekly:  CBC/D and BMP, Labs - Every other week:  ESR and CRP Method of administration: IV Push Method of administration may be changed at the discretion of home infusion pharmacist based upon assessment of the patient and/or caregiver's ability to self-administer the medication ordered.   insulin aspart 100 UNIT/ML injection Commonly known as: novoLOG Inject 0-15 Units into the skin 3 (three) times daily with meals. CBG 70 - 120: 0 units CBG 121 - 150: 2 units CBG 151 - 200: 3 units CBG 201 - 250: 5 units CBG 251 - 300: 8 units CBG 301 - 350: 11 units CBG 351 - 400: 15 units CBG > 400: call MD and obtain STAT lab verification   insulin glargine-yfgn 100 UNIT/ML injection Commonly known as: SEMGLEE Inject 0.2 mLs (20 Units total) into the skin at bedtime.   oxyCODONE 5 MG immediate release tablet Commonly known as: Oxy IR/ROXICODONE Take 1 tablet (5 mg total) by mouth every 6 (six) hours  as needed for up to 7 days for severe pain.   polyethylene glycol 17 g packet Commonly known as: MIRALAX / GLYCOLAX Take 17 g by mouth 2 (two) times daily as needed for mild constipation.   senna-docusate 8.6-50 MG tablet Commonly known as: Senokot-S Take 2 tablets by mouth 2 (two) times daily as needed for mild constipation.   tamsulosin 0.4 MG Caps capsule Commonly known  as: FLOMAX Take 1 capsule (0.4 mg total) by mouth daily.               Discharge Care Instructions  (From admission, onward)           Start     Ordered   09/04/22 0000  Discharge wound care:       Comments: Wound care  Daily      Comments: Bedside RN to perform and teach family/patient as needed for discharge. Cleanse left toe amputation site with NS and pat dry. Apply adaptic to suture line for atraumatic dressing removal.  Cover with gauze and kerlix/tape  change daily.   09/04/22 0849   09/04/22 0000  Change dressing on IV access line weekly and PRN  (Home infusion instructions - [facility] )        09/04/22 1545            Consultations: Infectious disease Interventional radiology Urology  Procedures/Studies: 1/14-TURP and prostate abscess unroofing 1/18-drain placement for left psoas abscess 1/23-TEE without significant finding   CT ABDOMEN PELVIS W CONTRAST  Result Date: 09/04/2022 CLINICAL DATA:  Psoas muscle abscess EXAM: CT ABDOMEN AND PELVIS WITH CONTRAST TECHNIQUE: Multidetector CT imaging of the abdomen and pelvis was performed using the standard protocol following bolus administration of intravenous contrast. RADIATION DOSE REDUCTION: This exam was performed according to the departmental dose-optimization program which includes automated exposure control, adjustment of the mA and/or kV according to patient size and/or use of iterative reconstruction technique. CONTRAST:  131mL OMNIPAQUE IOHEXOL 300 MG/ML  SOLN COMPARISON:  CT AP, 08/28/2022.  IR CT, 08/22/2022.  FINDINGS: Lower chest: No acute abnormality. Sub-6 mm pulmonary nodules at the lung bases. Hepatobiliary: No focal liver abnormality is seen. No gallstones, gallbladder wall thickening, or biliary dilatation. Pancreas: No pancreatic ductal dilatation or surrounding inflammatory changes. Spleen: Normal in size without focal abnormality. Inferior pole accessory spleen Adrenals/Urinary Tract: Adrenal glands are unremarkable. Kidneys are normal, without renal calculi, focal lesion, or hydronephrosis. Bladder is unremarkable. Stomach/Bowel: Stomach is within normal limits. Nonobstructed small bowel. Pelvic appendix appears normal. Nondilated colon. Redundant sigmoid. Mild rectal distention of stool. No evidence of bowel wall thickening, distention, or inflammatory changes. Vascular/Lymphatic: No significant vascular findings are present. No enlarged abdominal or pelvic lymph nodes. Reproductive: Prostate is unremarkable. Other: 1. Retracted LEFT lower quadrant percutaneous drainage catheter, with pigtail located within the subcutaneous tissues. 2. Resolution of previously-demonstrated LEFT psoas abscess. 3. Interval decrease in size of RIGHT iliopsoas abscess, measuring approximately 2.5 x 2.5 cm previously 3.5 x 2.6 cm. Musculoskeletal: Obese with rotund abdomen and pannus. Scattered degenerative changes of the imaged spine. No acute or significant osseous findings. No follow-up is indicated for incidental findings, unless specifically mentioned. IMPRESSION: Since CT AP dated 08/28/2022; 1. Resolution of prior LEFT psoas abscess, though with interval retraction of now malpositioned LEFT lower quadrant drainage catheter. 2. Decrease in size of RIGHT iliopsoas abscess measuring 2.5 cm, previously 3.5 cm 3.  Additional incidental, chronic and senescent findings as above. Electronically Signed   By: Michaelle Birks M.D.   On: 09/04/2022 16:55   [place] EKG Site Rite  Result Date: 09/02/2022 If Site Rite image not attached,  placement could not be confirmed due to current cardiac rhythm.  DG CHEST PORT 1 VIEW  Result Date: 09/01/2022 CLINICAL DATA:  PICC line placement EXAM: PORTABLE CHEST 1 VIEW COMPARISON:  08/29/2022 FINDINGS: Left PICC tip projecting over the location SVC/brachiocephalic confluence. Stable cardiomediastinal silhouette. No focal  consolidation, pleural effusion, or pneumothorax. IMPRESSION: Left PICC tip at the SVC/brachiocephalic confluence. Electronically Signed   By: Minerva Festeryler  Stutzman M.D.   On: 09/01/2022 23:24   DG CHEST PORT 1 VIEW  Result Date: 08/29/2022 CLINICAL DATA:  PICC line placement EXAM: PORTABLE CHEST 1 VIEW COMPARISON:  None Available. FINDINGS: Right costophrenic angle excluded from view. Extreme lung apices excluded from view. Visualized lungs are clear. No definite pneumothorax or pleural effusion. Cardiac size within normal limits. Pulmonary vascularity is normal. Left upper extremity PICC line tip seen within the superior vena cava. No acute bone abnormality. IMPRESSION: 1. Left upper extremity PICC line tip within the superior vena cava. Electronically Signed   By: Helyn NumbersAshesh  Parikh M.D.   On: 08/29/2022 20:41   US EKG SITE RITE  Result Date: 08/29/2022 If Site Rite image not attached, placement could not be confirmed due to current cardiac rhythm.  US EKG SITE RITE  Result Date: 08/29/2022 If Site Rite image not attached, placement could not be confirmed due to current cardiac rhythm.  DG CHEST PORT 1 VIEW  Result Date: 08/29/2022 CLINICAL DATA:  PICC line placement. EXAM: PORTABLE CHEST 1 VIEW COMPARISON:  Radiographs 08/19/2022 and 08/13/2022. FINDINGS: 1453 hours. Three views are submitted. The central portion of a left arm PICC is not visualized beyond the level of the trachea, near the confluence of the brachiocephalic veins. The heart size and mediastinal contours are stable. The aeration of the lung bases has improved from the prior study. No evidence of pleural effusion  or pneumothorax. No acute osseous findings. IMPRESSION: The central portion of the left arm PICC is not well visualized and may terminate near the confluence of the brachiocephalic veins. Improved pulmonary aeration. Electronically Signed   By: Carey BullocksWilliam  Veazey M.D.   On: 08/29/2022 15:52   CT ABDOMEN PELVIS W CONTRAST  Result Date: 08/28/2022 CLINICAL DATA:  Evaluate bilateral psoas muscle abscesses EXAM: CT ABDOMEN AND PELVIS WITH CONTRAST TECHNIQUE: Multidetector CT imaging of the abdomen and pelvis was performed using the standard protocol following bolus administration of intravenous contrast. RADIATION DOSE REDUCTION: This exam was performed according to the departmental dose-optimization program which includes automated exposure control, adjustment of the mA and/or kV according to patient size and/or use of iterative reconstruction technique. CONTRAST:  100mL OMNIPAQUE IOHEXOL 300 MG/ML  SOLN COMPARISON:  CT 08/21/2022, 08/16/2022, 08/06/2022 FINDINGS: Lower chest: No acute abnormality. Hepatobiliary: No focal liver abnormality is seen. No gallstones, gallbladder wall thickening, or biliary dilatation. Pancreas: Pancreatic atrophy without ductal dilatation or inflammatory changes. Spleen: Normal in size without focal abnormality. Adrenals/Urinary Tract: Unchanged renal gland thickening without well-defined nodule. Kidneys enhance symmetrically. No renal lesion, stone, or hydronephrosis. Small bubble of air within the urinary bladder, presumably related to prior instrumentation. Previously seen Foley catheter has been removed. Stomach/Bowel: Stomach is within normal limits. Appendix appears normal. No evidence of bowel wall thickening, distention, or inflammatory changes. Large volume of stool throughout the colon. Vascular/Lymphatic: No significant vascular findings are present. No enlarged abdominal or pelvic lymph nodes. Reproductive: Continued improvement in the appearance of the prostate gland  following surgical unroofing. Small areas of ill-defined low attenuation within the prostate apex, more prominent on the left (series 2, image 96). No rim enhancement is evident. Other: Interval placement of a percutaneous pigtail drainage catheter within the left psoas collection. Persistent but decreasing size of fluid collection in the left psoas muscle. Small foci of air are now present within the collection, likely related to recent drain placement.  The right-sided psoas abscess is not significantly changed in size compared to 08/21/2022 (series 2, image 65). No gas within the collection. No ascites. No new fluid collections. No pneumoperitoneum. Musculoskeletal: No new or acute bony findings. Degenerative disc disease at L3-4 and L4-5 has not appreciably changed. IMPRESSION: 1. Interval placement of a percutaneous pigtail drainage catheter within the left psoas collection. Persistent but decreasing size of fluid collection in the left psoas muscle. 2. The right-sided psoas abscess is not significantly changed in size compared to 08/21/2022. 3. Continued improvement in the appearance of the prostate gland following surgical unroofing. Small areas of ill-defined low attenuation within the prostate apex, more prominent on the left. 4. Large volume of stool throughout the colon. Electronically Signed   By: Duanne Guess D.O.   On: 08/28/2022 10:01   ECHO TEE  Result Date: 08/27/2022    TRANSESOPHOGEAL ECHO REPORT   Patient Name:   Durrell Barajas Date of Exam: 08/27/2022 Medical Rec #:  [id number]        Height:       76.0 in Accession #:    [id number]       Weight:       380.3 lb Date of Birth:  12/17/1960         BSA:          2.911 m Patient Age:    61 years         BP:           132/88 mmHg Patient Gender: M                HR:           85 bpm. Exam Location:  Inpatient Procedure: Transesophageal Echo, Cardiac Doppler and Color Doppler Indications:     Bacteremia  History:         Patient has no prior  history of Echocardiogram examinations.                  Abnormal ECG, Arrythmias:Atrial Fibrillation,                  Signs/Symptoms:Bacteremia; Risk Factors:Dyslipidemia and                  Diabetes.  Sonographer:     Sheralyn Boatman RDCS Referring Phys:  [facility] Diagnosing Phys: Thurmon Fair MD PROCEDURE: After discussion of the risks and benefits of a TEE, an informed consent was obtained from the patient. The transesophogeal probe was passed without difficulty through the esophogus of the patient. Imaged were obtained with the patient in a left lateral decubitus and supine position. Sedation performed by different physician. The patient was monitored while under deep sedation. Anesthestetic sedation was provided intravenously by Anesthesiology: 260mg  of Propofol. The patient's vital signs;  including heart rate, blood pressure, and oxygen saturation; remained stable throughout the procedure. The patient developed no complications during the procedure.  IMPRESSIONS  1. Left ventricular ejection fraction, by estimation, is 60 to 65%. The left ventricle has normal function. The left ventricle has no regional wall motion abnormalities. There is mild concentric left ventricular hypertrophy.  2. Right ventricular systolic function is normal. The right ventricular size is normal.  3. No left atrial/left atrial appendage thrombus was detected.  4. The mitral valve is normal in structure. Trivial mitral valve regurgitation. No evidence of mitral stenosis.  5. The aortic valve is normal in structure. Aortic valve regurgitation is not visualized. No aortic stenosis is present.  6. The inferior vena cava is normal in size with greater than 50% respiratory variability, suggesting right atrial pressure of 3 mmHg. Conclusion(s)/Recommendation(s): Normal biventricular function without evidence of hemodynamically significant valvular heart disease. FINDINGS  Left Ventricle: Left ventricular ejection fraction, by estimation, is  60 to 65%. The left ventricle has normal function. The left ventricle has no regional wall motion abnormalities. The left ventricular internal cavity size was normal in size. There is  mild concentric left ventricular hypertrophy. Right Ventricle: The right ventricular size is normal. No increase in right ventricular wall thickness. Right ventricular systolic function is normal. Left Atrium: Left atrial size was normal in size. No left atrial/left atrial appendage thrombus was detected. Right Atrium: Right atrial size was normal in size. Pericardium: There is no evidence of pericardial effusion. Mitral Valve: The mitral valve is normal in structure. Trivial mitral valve regurgitation. No evidence of mitral valve stenosis. Tricuspid Valve: The tricuspid valve is normal in structure. Tricuspid valve regurgitation is not demonstrated. No evidence of tricuspid stenosis. Aortic Valve: The aortic valve is normal in structure. Aortic valve regurgitation is not visualized. No aortic stenosis is present. Pulmonic Valve: The pulmonic valve was normal in structure. Pulmonic valve regurgitation is not visualized. No evidence of pulmonic stenosis. Aorta: The aortic root is normal in size and structure. Venous: The inferior vena cava is normal in size with greater than 50% respiratory variability, suggesting right atrial pressure of 3 mmHg. IAS/Shunts: No atrial level shunt detected by color flow Doppler. Additional Comments: Spectral Doppler performed. Thurmon Fair MD Electronically signed by Thurmon Fair MD Signature Date/Time: 08/27/2022/3:12:17 PM    Final    CT GUIDED PERITONEAL/RETROPERITONEAL FLUID DRAIN BY PERC CATH  Result Date: 08/23/2022 INDICATION: 62 year old with history of prostate abscess. Bilateral psoas fluid collections are concerning for abscesses. EXAM: 1. CT-guided placement of drainage catheter in left psoas abscess 2. CT-guided aspiration of right psoas muscle MEDICATIONS: Moderate sedation  ANESTHESIA/SEDATION: Moderate (conscious) sedation was employed during this procedure. A total of Versed 3mg  and fentanyl 150 mcg was administered intravenously at the order of the provider performing the procedure. Total intra-service moderate sedation time: 61 minutes. Patient's level of consciousness and vital signs were monitored continuously by radiology nurse throughout the procedure under the supervision of the provider performing the procedure. COMPLICATIONS: None immediate. PROCEDURE: Informed written consent was obtained from the patient after a thorough discussion of the procedural risks, benefits and alternatives. All questions were addressed. Maximal Sterile Barrier Technique was utilized including caps, mask, sterile gowns, sterile gloves, sterile drape, hand hygiene and skin antiseptic. A timeout was performed prior to the initiation of the procedure. Patient was placed supine on the CT table and the left side was slightly elevated. CT images through the lower abdomen and pelvis were identified. The abnormal left psoas muscle was identified. The left lower quadrant of the abdomen was prepped with chlorhexidine and sterile field was created. Skin was anesthetized using 1% lidocaine. Using CT guidance, an 18 gauge trocar needle was directed into the lateral aspect of the left psoas muscle. Bloody purulent fluid was aspirated. Superstiff Amplatz wire was advanced into the muscle. The tract was dilated to accommodate a 10 drain. 20 mL of bloody purulent fluid was removed. Follow up CT images were obtained. Drain was attached to a suction bulb and sutured to skin. Fluid was sent for culture. A dressing was placed. Attention was directed to the right psoas fluid collection. Patient was slightly repositioned with the right side mildly  elevated. Additional CT images were obtained of the abdomen and pelvis. The fluid collection was identified in the superior aspect of the psoas muscle but there was not  a percutaneous window to access this area. It was not clear if this fluid was tracking along the inferior aspect of the psoas muscle. Plan was to aspirate this area to see if there is a connection with the more superior fluid collection. The right lateral abdomen was prepped and draped in sterile fashion. Skin was anesthetized using 1% lidocaine. Using CT guidance, 18 gauge trocar needle was directed into the right psoas muscle but only a small amount of bloody fluid could be aspirated. No purulent fluid was aspirated. As a result, this needle was removed. RADIATION DOSE REDUCTION: This exam was performed according to the departmental dose-optimization program which includes automated exposure control, adjustment of the mA and/or kV according to patient size and/or use of iterative reconstruction technique. FINDINGS: Complex left psoas abscess. Drain was placed in the more lateral aspect of the abscess and 20 mL of bloody purulent fluid was obtained. Another component to the abscess contains gas along the medial aspect of the left psoas. This collection was not immediately decompressed following drain placement. Low density collection involving the superomedial aspect of the right psoas muscle. No percutaneous window to access the superomedial aspect of the right psoas muscle. Needle was directed into the inferior aspect of the right psoas muscle but no purulent fluid could be aspirated from this area. IMPRESSION: 1. CT-guided placement of a drainage catheter within the left psoas abscess collection. Left psoas abscess collection appears to be complex or there is a second collection. Patient will need follow-up imaging to follow these left psoas collections. 2. The low-density collection along the superomedial aspect of the right psoas muscle could not be accessed. The inferior aspect of the right psoas muscle was aspirated but no purulent fluid was obtained. Electronically Signed   By: Richarda Overlie M.D.   On:  08/23/2022 09:07   CT ASPIRATION N/S  Result Date: 08/23/2022 INDICATION: 62 year old with history of prostate abscess. Bilateral psoas fluid collections are concerning for abscesses. EXAM: 1. CT-guided placement of drainage catheter in left psoas abscess 2. CT-guided aspiration of right psoas muscle MEDICATIONS: Moderate sedation ANESTHESIA/SEDATION: Moderate (conscious) sedation was employed during this procedure. A total of Versed 3mg  and fentanyl 150 mcg was administered intravenously at the order of the provider performing the procedure. Total intra-service moderate sedation time: 61 minutes. Patient's level of consciousness and vital signs were monitored continuously by radiology nurse throughout the procedure under the supervision of the provider performing the procedure. COMPLICATIONS: None immediate. PROCEDURE: Informed written consent was obtained from the patient after a thorough discussion of the procedural risks, benefits and alternatives. All questions were addressed. Maximal Sterile Barrier Technique was utilized including caps, mask, sterile gowns, sterile gloves, sterile drape, hand hygiene and skin antiseptic. A timeout was performed prior to the initiation of the procedure. Patient was placed supine on the CT table and the left side was slightly elevated. CT images through the lower abdomen and pelvis were identified. The abnormal left psoas muscle was identified. The left lower quadrant of the abdomen was prepped with chlorhexidine and sterile field was created. Skin was anesthetized using 1% lidocaine. Using CT guidance, an 18 gauge trocar needle was directed into the lateral aspect of the left psoas muscle. Bloody purulent fluid was aspirated. Superstiff Amplatz wire was advanced into the muscle. The tract was dilated to accommodate  a 10 [place] drain. 20 mL of bloody purulent fluid was removed. Follow up CT images were obtained. Drain was attached to a suction bulb and sutured to skin.  Fluid was sent for culture. A dressing was placed. Attention was directed to the right psoas fluid collection. Patient was slightly repositioned with the right side mildly elevated. Additional CT images were obtained of the abdomen and pelvis. The fluid collection was identified in the superior aspect of the psoas muscle but there was not a percutaneous window to access this area. It was not clear if this fluid was tracking along the inferior aspect of the psoas muscle. Plan was to aspirate this area to see if there is a connection with the more superior fluid collection. The right lateral abdomen was prepped and draped in sterile fashion. Skin was anesthetized using 1% lidocaine. Using CT guidance, 18 gauge trocar needle was directed into the right psoas muscle but only a small amount of bloody fluid could be aspirated. No purulent fluid was aspirated. As a result, this needle was removed. RADIATION DOSE REDUCTION: This exam was performed according to the departmental dose-optimization program which includes automated exposure control, adjustment of the mA and/or kV according to patient size and/or use of iterative reconstruction technique. FINDINGS: Complex left psoas abscess. Drain was placed in the more lateral aspect of the abscess and 20 mL of bloody purulent fluid was obtained. Another component to the abscess contains gas along the medial aspect of the left psoas. This collection was not immediately decompressed following drain placement. Low density collection involving the superomedial aspect of the right psoas muscle. No percutaneous window to access the superomedial aspect of the right psoas muscle. Needle was directed into the inferior aspect of the right psoas muscle but no purulent fluid could be aspirated from this area. IMPRESSION: 1. CT-guided placement of a drainage catheter within the left psoas abscess collection. Left psoas abscess collection appears to be complex or there is a second  collection. Patient will need follow-up imaging to follow these left psoas collections. 2. The low-density collection along the superomedial aspect of the right psoas muscle could not be accessed. The inferior aspect of the right psoas muscle was aspirated but no purulent fluid was obtained. Electronically Signed   By: Richarda Overlie M.D.   On: 08/23/2022 09:07   [place] EKG SITE RITE  Result Date: 08/22/2022 If Site Rite image not attached, placement could not be confirmed due to current cardiac rhythm.  CT ABDOMEN PELVIS W CONTRAST  Result Date: 08/21/2022 CLINICAL DATA:  Left lower quadrant and flank pain and known bilateral intramuscular iliopsoas abscesses and status post surgical unroofing bilateral prostate abscesses on 08/18/2022. EXAM: CT ABDOMEN AND PELVIS WITH CONTRAST TECHNIQUE: Multidetector CT imaging of the abdomen and pelvis was performed using the standard protocol following bolus administration of intravenous contrast. RADIATION DOSE REDUCTION: This exam was performed according to the departmental dose-optimization program which includes automated exposure control, adjustment of the mA and/or kV according to patient size and/or use of iterative reconstruction technique. CONTRAST:  OMNIPAQUE IOHEXOL 300 MG/ML  SOLN COMPARISON:  Prior CT of the abdomen and pelvis on 08/17/2022 FINDINGS: Lower chest: No acute abnormality. Hepatobiliary: No focal liver abnormality is seen. No gallstones, gallbladder wall thickening, or biliary dilatation. Pancreas: Stable atrophic pancreas without inflammation. Spleen: Normal in size without focal abnormality. Adrenals/Urinary Tract: Adrenal glands are unremarkable. Kidneys are normal, without renal calculi, focal lesion, or hydronephrosis. Bladder is decompressed by a Foley catheter. Stomach/Bowel:  Bowel shows no evidence of obstruction, ileus, inflammation or lesion. The appendix is not visualized. No free intraperitoneal air. Vascular/Lymphatic: No vascular  findings. No lymphadenopathy identified in the abdomen or pelvis. Reproductive: Prostate gland shows significant improvement after unroofing with near resolution of abscesses. There remains a small left sided intra prostate fluid collection measuring up to 1.9 cm in transverse diameter. Other collections appear resolved. Other: Bilateral intramuscular ileo psoas collections remain. The elongated right-sided collection measures approximately 4.2 x 3.0 cm in greatest transverse dimensions at the level of the S1 vertebral body which is likely slightly larger compared to comparable measurements obtained currently 3.8 x 2.9 cm at the same level. At least 2 separate left-sided iliopsoas collections remain with the largest measuring roughly 4.1 cm in greatest diameter at the level of the mid sacrum. This collection appeared similar in size measuring approximately 4 cm at the same level on the prior study. Superiorly the collection may be slightly larger in diameter measuring 2.5 cm at the level of the L5 vertebral body compared to 1.9 cm previously. Musculoskeletal: No fractures or bony destruction. Stable degenerative disc disease in the lower lumbar spine. IMPRESSION: 1. Significant improvement in the appearance of the prostate gland after surgical unroofing with near resolution of abscesses. There remains a small left-sided intra prostate fluid collection measuring up to 1.9 cm in transverse diameter. 2. Bilateral iliopsoas fluid collections remain. The right-sided collection is likely slightly larger compared to the prior study. The left-sided iliopsoas collection is similar to slightly enlarged compared to the prior study. Electronically Signed   By: Irish Lack M.D.   On: 08/21/2022 11:16   DG CHEST PORT 1 VIEW  Result Date: 08/19/2022 CLINICAL DATA:  Central line placement EXAM: PORTABLE CHEST 1 VIEW COMPARISON:  08/13/2022 FINDINGS: The heart size and mediastinal contours are within normal limits. Unchanged  appearance of a right neck vascular catheter, tip over the lower SVC. Diffuse bilateral interstitial pulmonary opacity. The visualized skeletal structures are unremarkable. IMPRESSION: 1. Unchanged appearance of a right neck vascular catheter, tip over the lower SVC. 2. Diffuse bilateral interstitial pulmonary opacity, consistent with edema or atypical/viral infection. No new or focal airspace opacity. Electronically Signed   By: Jearld Lesch M.D.   On: 08/19/2022 15:50   CT ABDOMEN PELVIS W WO CONTRAST  Result Date: 08/18/2022 CLINICAL DATA:  62 year old male history of prostate abscess. Psoas abscess. EXAM: CT ABDOMEN AND PELVIS WITHOUT AND WITH CONTRAST TECHNIQUE: Multidetector CT imaging of the abdomen and pelvis was performed following the standard protocol before and following the bolus administration of intravenous contrast. RADIATION DOSE REDUCTION: This exam was performed according to the departmental dose-optimization program which includes automated exposure control, adjustment of the mA and/or kV according to patient size and/or use of iterative reconstruction technique. CONTRAST:  OMNIPAQUE IOHEXOL 300 MG/ML  SOLN COMPARISON:  CT of the abdomen and pelvis without contrast 08/06/2022. FINDINGS: Comment: Suboptimal contrast bolus slightly limits today's examination. Lower chest: Tip of central venous catheter at superior cavoatrial junction. Hepatobiliary: Diffuse low attenuation throughout the hepatic parenchyma, indicative of a background of severe hepatic steatosis. No suspicious cystic or solid hepatic lesions. No intra or extrahepatic biliary ductal dilatation. Gallbladder is unremarkable in appearance. Pancreas: Diffuse pancreatic atrophy. No pancreatic mass. No pancreatic ductal dilatation. No pancreatic or peripancreatic fluid collections or inflammatory changes. Spleen: Unremarkable. Adrenals/Urinary Tract: Bilateral kidneys and adrenal glands are normal in appearance. No  hydroureteronephrosis. Urinary bladder is nearly completely decompressed around an indwelling Foley balloon  catheter. Small amount of gas within the lumen of the urinary bladder is presumably iatrogenic. Stomach/Bowel: The appearance of the stomach is unremarkable. No pathologic dilatation of small bowel or colon. Diffuse scattered colonic diverticuli are noted without surrounding inflammatory changes to indicate an acute diverticulitis at this time. Normal appendix. Vascular/Lymphatic: No atherosclerotic calcifications are noted in the abdominal aorta or pelvic vasculature. No definite lymphadenopathy noted in the abdomen or pelvis. Reproductive: Prostate gland is massively enlarged and very heterogeneous in appearance. There are enlarging low-attenuation regions in the prostate gland, largest of which is on the left side (axial image 97 of series 4) measuring 4.6 x 4.1 cm, concerning for multifocal prostatic abscesses. Seminal vesicles are unremarkable in appearance. Other: No significant volume of ascites.  No pneumoperitoneum. Musculoskeletal: Compared to the prior examination there are new space-occupying areas of low attenuation in the psoas musculature bilaterally, concerning for psoas abscesses (poorly evaluated secondary to suboptimal contrast bolus), well appreciated on axial image 72 of series 4, particularly when narrowing the window settings. The largest of these is on the right side (axial image 72 of series 4 and coronal image 68 of series 8) estimated to measure approximately 3.2 x 2.8 x 6.8 cm. IMPRESSION: 1. Massively enlarged and heterogeneous appearing prostate gland with multiple rim enhancing low-attenuation areas, presumably prostatic abscesses, as detailed above. 2. Interval development of space-occupying low-attenuation regions in the psoas musculature bilaterally, concerning for psoas abscesses. 3. Hepatic steatosis. 4. Mild colonic diverticulosis without evidence of acute diverticulitis at  this time. Electronically Signed   By: Trudie Reed M.D.   On: 08/18/2022 08:53       The results of significant diagnostics from this hospitalization (including imaging, microbiology, ancillary and laboratory) are listed below for reference.     Microbiology: No results found for this or any previous visit (from the past 240 hour(s)).   Labs:  CBC: Recent Labs  Lab 09/01/22 0550 09/05/22 0508  WBC 7.7 7.7  HGB 10.2* 10.8*  HCT 32.2* 34.6*  MCV 83.6 84.8  PLT 245 356   BMP &GFR Recent Labs  Lab 09/01/22 0550 09/05/22 0508  NA 132* 132*  K 3.9 3.7  CL 98 99  CO2 26 24  GLUCOSE 172* 238*  BUN 12 12  CREATININE 0.70 0.69  CALCIUM 8.2* 8.3*  MG  --  1.8  PHOS  --  3.2   Estimated Creatinine Clearance: 166.9 mL/min (by C-G formula based on SCr of 0.69 mg/dL). Liver & Pancreas: Recent Labs  Lab 09/05/22 0508  ALBUMIN 2.5*   No results for input(s): "LIPASE", "AMYLASE" in the last 168 hours. No results for input(s): "AMMONIA" in the last 168 hours. Diabetic: No results for input(s): "HGBA1C" in the last 72 hours. Recent Labs  Lab 09/05/22 0739 09/05/22 1115 09/05/22 1630 09/05/22 2124 09/06/22 0751  GLUCAP 195* 137* 108* 201* 140*   Cardiac Enzymes: No results for input(s): "CKTOTAL", "CKMB", "CKMBINDEX", "TROPONINI" in the last 168 hours. No results for input(s): "PROBNP" in the last 8760 hours. Coagulation Profile: No results for input(s): "INR", "PROTIME" in the last 168 hours. Thyroid Function Tests: No results for input(s): "TSH", "T4TOTAL", "FREET4", "T3FREE", "THYROIDAB" in the last 72 hours. Lipid Profile: Recent Labs    09/05/22 0508  CHOL 131  HDL 24*  LDLCALC 85  TRIG 767  CHOLHDL 5.5   Anemia Panel: No results for input(s): "VITAMINB12", "FOLATE", "FERRITIN", "TIBC", "IRON", "RETICCTPCT" in the last 72 hours. Urine analysis:    Component Value Date/Time  COLORURINE YELLOW 08/06/2022 [place] 08/06/2022 1255    LABSPEC 1.023 08/06/2022 1255   PHURINE 6.0 08/06/2022 1255   GLUCOSEU >=500 (A) 08/06/2022 1255   HGBUR SMALL (A) 08/06/2022 1255   BILIRUBINUR NEGATIVE 08/06/2022 1255   KETONESUR 20 (A) 08/06/2022 1255   PROTEINUR 30 (A) 08/06/2022 1255   NITRITE NEGATIVE 08/06/2022 1255   LEUKOCYTESUR NEGATIVE 08/06/2022 1255   Sepsis Labs: Invalid input(s): "PROCALCITONIN", "LACTICIDVEN"   SIGNED:  Mercy Riding, MD  [facility] 09/06/2022, 10:46 AM

## 2022-09-06 NOTE — Progress Notes (Signed)
OT Cancellation Note  Patient Details Name: William [name] MRN: [id number] DOB: [birth year]   Cancelled Treatment:    Reason Eval/Treat Not Completed: Other (comment). The pt gently declined to participate in therapy, stating he would be leaving for SNF rehab shortly.   Leota Sauers, OTR/L 09/06/2022, 1:57 PM

## 2022-09-06 NOTE — Progress Notes (Deleted)
Assessment: 1. Prostate abscess   2. Prostate cancer ([place])     Plan: Continue tamsulosin 0.4 mg daily. Discontinue Toradol. Begin meloxicam 7.5 mg daily. Prescription for oxycodone 5 mg every 6 hours as needed provided. Return to office in 3-4 weeks for reevaluation.  Chief Complaint:  No chief complaint on file.   History of Present Illness:  William [name] is a 62 y.o. male who is seen for further evaluation of prostate abscess.   He was seen by urgent care on 07/31/2022 with dysuria difficulty voiding, and left sided back/flank pain.  No fever or chills.  Urine culture at that time grew >100 K Staphylococcus, sensitive to Cipro.  He was started on ciprofloxacin x 14 days, Flomax, and Toradol.  He presented to the emergency room on 08/06/2022 with continued left low back pain.  Urinalysis was unremarkable.  White blood cell count elevated at 17.2 K.  CT imaging from 08/06/22 showed no renal or ureteral calculi, no obstruction, enlarged prostate which appeared hypodense. He continued on the Cipro, Flomax, and Toradol with some improvement in his symptoms.  He continues to have pain in the left back and flank area, pain in the rectal area, and lower urinary tract symptoms including frequency, urgency, decreased stream, and sensation of incomplete emptying.  No gross hematuria. IPSS = 35. His exam on 08/06/22 was consistent with prostatitis. He was continued on the Cipro and noted to have some improvement in his symptoms.  He continued to have flank and back pain and pain in the rectal area, but his urinary tract symptoms had improved somewhat. On 08/12/2022 the patient was admitted to the outside hospital with DKA a and A-fib with RVR.  He was complaining of weakness shortness of breath.  He was noted to be hypoxic and his blood sugar was over 500.  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  Further evaluation with CT  scan subsequently demonstrated possible prostatic abscess and psoas abscess on the left. He underwent TUR of prostatic abscess on 08/18/22.  He also had IR placement of perc drains for the psoas abscess. Urine culture and abscess culture grew Staph. 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  Portions of the above documentation were copied from a prior visit for review purposes only.   Past Medical History:  Past Medical History:  Diagnosis Date   Diabetes mellitus, type II ([place])    Hyperlipidemia    Hypertension     Past Surgical History:  Past Surgical History:  Procedure Laterality Date   LEG SURGERY Right 2021   TEE WITHOUT CARDIOVERSION N/A 08/27/2022   Procedure: TRANSESOPHAGEAL ECHOCARDIOGRAM (TEE);  Surgeon: Sanda Klein, MD;  Location: [facility] ENDOSCOPY;  Service: Cardiovascular;  Laterality: N/A;   TRANSURETHRAL RESECTION OF PROSTATE N/A 08/18/2022   Procedure: PROSTATE ABSCESS UNROOFING, TRANSURETHRAL RESECTION OF THE PROSTATE (TURP);  Surgeon: Ardis Hughs, MD;  Location: [facility] ORS;  Service: Urology;  Laterality: N/A;    Allergies:  Allergies  Allergen Reactions   Diltiazem Hcl Other (See Comments)    Causes accelerated heart rate    Family History:  Family History  Problem Relation Age of Onset   Thyroid disease Mother    Hypertension Mother    Diabetes Father    Hypertension Father    Hypertension Brother     Social History:  Social History   Tobacco Use  Smoking status: Never   Smokeless tobacco: Never  Vaping Use   Vaping Use: Never used  Substance Use Topics   Alcohol use: No   Drug use: No    ROS: Constitutional:  Negative for fever, chills, weight loss CV: Negative for chest pain, previous MI, hypertension Respiratory:  Negative for shortness of breath, wheezing, sleep apnea, frequent cough GI:  Negative for nausea, vomiting, bloody stool, GERD  Physical exam: There were no vitals  taken for this visit. GENERAL APPEARANCE:  Well appearing, well developed, well nourished, NAD HEENT:  Atraumatic, normocephalic, oropharynx clear NECK:  Supple without lymphadenopathy or thyromegaly ABDOMEN:  Soft, non-tender, no masses EXTREMITIES:  Moves all extremities well, without clubbing, cyanosis, or edema NEUROLOGIC:  Alert and oriented x 3, normal gait, CN II-XII grossly intact MENTAL STATUS:  appropriate BACK:  Non-tender to palpation, No CVAT SKIN:  Warm, dry, and intact   Results: None

## 2022-09-06 NOTE — Progress Notes (Signed)
Called report to [place] ,Therapist, sports at [employer].

## 2022-09-10 ENCOUNTER — Encounter (HOSPITAL_COMMUNITY): Payer: Self-pay | Admitting: Cardiovascular Disease

## 2022-09-10 ENCOUNTER — Inpatient Hospital Stay: Payer: Commercial Managed Care - PPO | Admitting: Internal Medicine

## 2022-09-10 NOTE — Progress Notes (Deleted)
Patient: William [name]  DOB: August 11, 1960 MRN: [id number]   Patient Active Problem List   Diagnosis Date Noted   Malnutrition of moderate degree 08/21/2022   Hyponatremia 08/20/2022   Hyperthyroidism 08/19/2022   MSSA bacteremia 08/18/2022   Hypokalemia 08/18/2022   Prostatic abscess 08/17/2022   Normocytic anemia 08/17/2022   Type 2 diabetes mellitus ([place]) 08/17/2022   Possible psoas muscle infection 08/17/2022   AKI (acute kidney injury) ([place]) 08/12/2022   Paroxysmal atrial fibrillation ([place]) 08/12/2022   DKA (diabetic ketoacidosis) ([place]) 08/12/2022   Leucocytosis 08/12/2022   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 M withWith A-fib, hypothyroidism, diabetes type 2 admitted to [facility] ER on 1/8 with severe sepsis found abdomen disseminated MSSA infection complicated by DKA transferred to [place] on 1/12 for further management of prostatic abscess presents for hospital follow-up.  Blood cultures at [facility] ER on 1/8 grew 2/2 MSSA, blood cultures at our facility on 1/13 with no growth. CT at [facility] ER showed showed hypodensities in prostate gland measuring 3.5X 2.2 cm, in the right Hemi gland and 4X 3 cm in the left Hemi gland suspicious for prostatic abscess.  Right psoas abscess measuring 3.2X 2.7 cm likely reflecting developing abscess, small pelvic free fluid.  Right middle lobe groundglass opacity, partially imaged. TTE at [facility] on  1/12 showed mildly thickened aortic leaflets.  TEE at our facility showed no growth.  IJ out on 1/15.  Urology engaged patient underwent TURP/prostate abscess on open 1/14 with cultures growing necessary.  IR engaged as CT on 1/19 showed bilateral iliopsoas fluid collection status post drain placement 1/18 with 20 mL purulent fluid aspirated with cultures growing MSSA.  PICC placed on 1/19 and patient transition to cefazolin to complete 6 weeks of antibiotics EOT 2/28 for bacteremia.  Plan on transitioning to p.o. cefadroxil for iliopsoas abscess with reimaging at around 6 weeks.   Review of Systems  All other systems reviewed and are negative.   Past Medical History:  Diagnosis Date   Diabetes mellitus, type II ([place])    Hyperlipidemia    Hypertension     Outpatient Medications Prior to Visit  Medication Sig Dispense Refill   albuterol (ACCUNEB) 1.25 MG/3ML nebulizer solution Take 1 ampule by nebulization every 6 (six) hours as needed for shortness of breath or wheezing.     atorvastatin (LIPITOR) 20 MG tablet Take 1 tablet (20 mg total) by mouth daily.     ceFAZolin (ANCEF) IVPB Inject 2 g into the vein every 8 (eight) hours for 28 days. Indication:  MSSA bacteremia First Dose: No Last Day of Therapy:  10/02/2022 Labs - Once weekly:  CBC/D and BMP, Labs - Every other week:  ESR and CRP Method of administration: IV Push Method of administration may be changed at the discretion of home infusion pharmacist based upon assessment of the patient and/or caregiver's ability to self-administer the medication ordered. 84 Units 0   insulin aspart (NOVOLOG) 100 UNIT/ML  injection Inject 0-15 Units into the skin 3 (three) times daily with meals. CBG 70 - 120: 0 units CBG 121 - 150: 2 units CBG 151 - 200: 3 units CBG 201 - 250: 5 units CBG 251 - 300: 8 units CBG 301 - 350: 11 units CBG 351 - 400: 15 units CBG > 400: call MD and obtain STAT lab verification 10 mL 11   insulin glargine-yfgn (SEMGLEE) 100  UNIT/ML injection Inject 0.2 mLs (20 Units total) into the skin at bedtime. 10 mL 11   oxyCODONE (OXY IR/ROXICODONE) 5 MG immediate release tablet Take 1 tablet (5 mg total) by mouth every 6 (six) hours as needed for up to 7 days for severe pain. 15 tablet 0   polyethylene glycol (MIRALAX / GLYCOLAX) 17 g packet Take 17 g by mouth 2 (two) times daily as needed for mild constipation. 14 each 0   senna-docusate (SENOKOT-S) 8.6-50 MG tablet Take 2 tablets by mouth 2 (two) times daily as needed for mild constipation.     tamsulosin (FLOMAX) 0.4 MG CAPS capsule Take 1 capsule (0.4 mg total) by mouth daily. 30 capsule 1   No facility-administered medications prior to visit.     Allergies  Allergen Reactions   Diltiazem Hcl Other (See Comments)    Causes accelerated heart rate    Social History   Tobacco Use   Smoking status: Never   Smokeless tobacco: Never  Vaping Use   Vaping Use: Never used  Substance Use Topics   Alcohol use: No   Drug use: No    Family History  Problem Relation Age of Onset   Thyroid disease Mother    Hypertension Mother    Diabetes Father    Hypertension Father    Hypertension Brother     Objective:  There were no vitals filed for this visit. There is no height or weight on file to calculate BMI.  Physical Exam Constitutional:      General: He is not in acute distress.    Appearance: He is normal weight. He is not toxic-appearing.  HENT:     Head: Normocephalic and atraumatic.     Right Ear: External ear normal.     Left Ear: External ear normal.     Nose: No congestion or rhinorrhea.     Mouth/Throat:     Mouth: Mucous membranes are moist.     Pharynx: Oropharynx is clear.  Eyes:     Extraocular Movements: Extraocular movements intact.     Conjunctiva/sclera: Conjunctivae normal.     Pupils: Pupils are equal, round, and reactive to light.  Cardiovascular:     Rate and Rhythm: Normal rate and regular rhythm.     Heart sounds: No murmur  heard.    No friction rub. No gallop.  Pulmonary:     Effort: Pulmonary effort is normal.     Breath sounds: Normal breath sounds.  Abdominal:     General: Abdomen is flat. Bowel sounds are normal.     Palpations: Abdomen is soft.  Musculoskeletal:        General: No swelling. Normal range of motion.     Cervical back: Normal range of motion and neck supple.  Skin:    General: Skin is warm and dry.  Neurological:     General: No focal deficit present.     Mental Status: He is oriented to person, place, and time.  Psychiatric:        Mood and Affect: Mood normal.  Lab Results: Lab Results  Component Value Date   WBC 7.7 09/05/2022   HGB 10.8 (L) 09/05/2022   HCT 34.6 (L) 09/05/2022   MCV 84.8 09/05/2022   PLT 356 09/05/2022    Lab Results  Component Value Date   CREATININE 0.69 09/05/2022   BUN 12 09/05/2022   NA 132 (L) 09/05/2022   K 3.7 09/05/2022   CL 99 09/05/2022   CO2 24 09/05/2022    Lab Results  Component Value Date   ALT 24 08/26/2022   AST 25 08/26/2022   ALKPHOS 234 (H) 08/26/2022   BILITOT 0.5 08/26/2022     Assessment & Plan:  # Disseminated MSSA bacteremia with b/l psoas abscess, prostatic abscess and lungs  -Cefazolin x 6 weeks EOT 2/28 -Follow-up in one month    Laurice Record, MD [facility]   09/10/22  8:59 AM

## 2022-09-11 ENCOUNTER — Telehealth: Payer: Self-pay

## 2022-09-11 ENCOUNTER — Encounter: Payer: Self-pay | Admitting: Internal Medicine

## 2022-09-11 ENCOUNTER — Ambulatory Visit (INDEPENDENT_AMBULATORY_CARE_PROVIDER_SITE_OTHER): Payer: Commercial Managed Care - PPO | Admitting: Internal Medicine

## 2022-09-11 ENCOUNTER — Other Ambulatory Visit: Payer: Self-pay

## 2022-09-11 VITALS — BP 90/60 | HR 86 | Temp 97.8°F

## 2022-09-11 DIAGNOSIS — M8618 Other acute osteomyelitis, other site: Secondary | ICD-10-CM

## 2022-09-11 DIAGNOSIS — M62830 Muscle spasm of back: Secondary | ICD-10-CM | POA: Diagnosis not present

## 2022-09-11 DIAGNOSIS — R7881 Bacteremia: Secondary | ICD-10-CM | POA: Diagnosis not present

## 2022-09-11 DIAGNOSIS — M869 Osteomyelitis, unspecified: Secondary | ICD-10-CM

## 2022-09-11 MED ORDER — CEFADROXIL 500 MG PO CAPS: 1000.0000 mg | ORAL_CAPSULE | Freq: Two times a day (BID) | ORAL | 0 refills | Status: AC

## 2022-09-11 NOTE — Telephone Encounter (Signed)
[employer], requested patient's labs be faxed over for provider review.   Beryle Flock, RN

## 2022-09-11 NOTE — Progress Notes (Signed)
Patient: William [name]  DOB: 1960-12-07 MRN: [id number] PCP: Pcp, No    Patient Active Problem List   Diagnosis Date Noted   Malnutrition of moderate degree 08/21/2022   Hyponatremia 08/20/2022   Hyperthyroidism 08/19/2022   MSSA bacteremia 08/18/2022   Hypokalemia 08/18/2022   Prostatic abscess 08/17/2022   Normocytic anemia 08/17/2022   Type 2 diabetes mellitus ([place]) 08/17/2022   Possible psoas muscle infection 08/17/2022   AKI (acute kidney injury) ([place]) 08/12/2022   Paroxysmal atrial fibrillation ([place]) 08/12/2022   DKA (diabetic ketoacidosis) ([place]) 08/12/2022   Leucocytosis 08/12/2022   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 Mwith past medical history of hypothyroidism, diabetes, morbid obesity presents for hospital follow-up for disseminated MSSA infection.  Patient was admitted from 1/12 - 2/2 as a transfer from [facility] ER for treatment secondary MSSA infection with left great toe osteomyelitis complicated by DKA.  Transferred to [facility] on 1/12 for further management of prostatic abscess.  Blood cultures at [facility] ER on 1/8 grew 2/2 MSSA, blood cultures 1/19 no growth.  CT at [facility] ER showed ypodensities in prostate gland measuring 3.5X 2.2 cm, in the right Hemi gland and 4X 3 cm in the left Hemi gland suspicious for prostatic abscess. Right psoas abscess measuring 3.2X 2.7 cm likely reflecting developing abscess,  small pelvic free fluid. Right middle lobe groundglass opacity, partially imaged..  Seen by urology at our facility status post TURP/prostate abscess (1/14).  CT on 1/17 showed b/l iliopsoas fluid collection, right side larger. On 1/18 pt underwent drain placment in left psoad abscess -removed 64m bloody purulent fluid(Cx MSSA). Noted that left psoas abscess has second collection, and right posad collection not aspirated(no window avaialbe.  Recommended follow-up CT.  I gave him 1/15.  [facility] TEE no vegetation noted.CT on 1/24 showed decrease in size of left psoas collection with PERC drain cath in place, right-sided psoas abscess not significant change since 1/17.  Continued improvement of prostate gland with small areas of ill-defined low attenuation in the prostate apex.  PICC line placed on 1/19 and patient discharged cefazolin x 6 weeks EOT 2/28 from negative cultures on 1/18.  Plan to send her prescription to cefadroxil p.o. twice daily for psoas abscess to repeat imaging available. Today: Patient reports that he does not have fevers or chills.  He has some back pain which is ongoing. Review of Systems  All other systems reviewed and are negative.   Past Medical History:  Diagnosis Date   Diabetes mellitus, type II ([place]    Hyperlipidemia    Hypertension     Outpatient Medications Prior to Visit  Medication Sig Dispense Refill   albuterol (ACCUNEB) 1.25 MG/3ML nebulizer solution Take 1 ampule by nebulization every 6 (six) hours as needed for shortness of breath or wheezing.     atorvastatin (LIPITOR) 20 MG tablet Take 1 tablet (20 mg total) by mouth daily.  ceFAZolin (ANCEF) IVPB Inject 2 g into the vein every 8 (eight) hours for 28 days. Indication:  MSSA bacteremia First Dose: No Last Day of Therapy:  10/02/2022 Labs - Once weekly:  CBC/D and BMP, Labs - Every other week:  ESR and CRP Method of administration: IV Push Method of  administration may be changed at the discretion of home infusion pharmacist based upon assessment of the patient and/or caregiver's ability to self-administer the medication ordered. 84 Units 0   insulin aspart (NOVOLOG) 100 UNIT/ML injection Inject 0-15 Units into the skin 3 (three) times daily with meals. CBG 70 - 120: 0 units CBG 121 - 150: 2 units CBG 151 - 200: 3 units CBG 201 - 250: 5 units CBG 251 - 300: 8 units CBG 301 - 350: 11 units CBG 351 - 400: 15 units CBG > 400: call MD and obtain STAT lab verification 10 mL 11   insulin glargine-yfgn (SEMGLEE) 100 UNIT/ML injection Inject 0.2 mLs (20 Units total) into the skin at bedtime. 10 mL 11   oxyCODONE (OXY IR/ROXICODONE) 5 MG immediate release tablet Take 1 tablet (5 mg total) by mouth every 6 (six) hours as needed for up to 7 days for severe pain. 15 tablet 0   polyethylene glycol (MIRALAX / GLYCOLAX) 17 g packet Take 17 g by mouth 2 (two) times daily as needed for mild constipation. 14 each 0   senna-docusate (SENOKOT-S) 8.6-50 MG tablet Take 2 tablets by mouth 2 (two) times daily as needed for mild constipation.     tamsulosin (FLOMAX) 0.4 MG CAPS capsule Take 1 capsule (0.4 mg total) by mouth daily. 30 capsule 1   No facility-administered medications prior to visit.     Allergies  Allergen Reactions   Diltiazem Hcl Other (See Comments)    Causes accelerated heart rate    Social History   Tobacco Use   Smoking status: Never   Smokeless tobacco: Never  Vaping Use   Vaping Use: Never used  Substance Use Topics   Alcohol use: No   Drug use: No    Family History  Problem Relation Age of Onset   Thyroid disease Mother    Hypertension Mother    Diabetes Father    Hypertension Father    Hypertension Brother     Objective:  There were no vitals filed for this visit. There is no height or weight on file to calculate BMI.  Physical Exam Constitutional:      General: He is not in acute distress.    Appearance: He is normal  weight. He is not toxic-appearing.  HENT:     Head: Normocephalic and atraumatic.     Right Ear: External ear normal.     Left Ear: External ear normal.     Nose: No congestion or rhinorrhea.     Mouth/Throat:     Mouth: Mucous membranes are moist.     Pharynx: Oropharynx is clear.  Eyes:     Extraocular Movements: Extraocular movements intact.     Conjunctiva/sclera: Conjunctivae normal.     Pupils: Pupils are equal, round, and reactive to light.  Cardiovascular:     Rate and Rhythm: Normal rate and regular rhythm.     Heart sounds: No murmur heard.    No friction rub. No gallop.  Pulmonary:     Effort: Pulmonary effort is normal.     Breath sounds: Normal breath sounds.  Abdominal:     General: Abdomen is flat. Bowel sounds are  normal.     Palpations: Abdomen is soft.  Musculoskeletal:        General: No swelling. Normal range of motion.     Cervical back: Normal range of motion and neck supple.  Skin:    General: Skin is warm and dry.  Neurological:     General: No focal deficit present.     Mental Status: He is oriented to person, place, and time.  Psychiatric:        Mood and Affect: Mood normal.     Lab Results: Lab Results  Component Value Date   WBC 7.7 09/05/2022   HGB 10.8 (L) 09/05/2022   HCT 34.6 (L) 09/05/2022   MCV 84.8 09/05/2022   PLT 356 09/05/2022    Lab Results  Component Value Date   CREATININE 0.69 09/05/2022   BUN 12 09/05/2022   NA 132 (L) 09/05/2022   K 3.7 09/05/2022   CL 99 09/05/2022   CO2 24 09/05/2022    Lab Results  Component Value Date   ALT 24 08/26/2022   AST 25 08/26/2022   ALKPHOS 234 (H) 08/26/2022   BILITOT 0.5 08/26/2022     Assessment & Plan:   # Disseminated MSSA bacteremia with  b/l psoas abscess, prostatic abscess and lungs - Blood cultures at [facility] ER on 1/8 grew 2/2 MSSA, blood cultures on 1/13 at our facility with no growth.   - TTE at [facility] on 1/12 showed mildly thickened aortic leaflets. , TEE no veg - SP  TURP/ptostate abscess unroofing on 1/14,  - CT on 1/17 showed b/l iliopsoas fluid collection, right side larger. On 1/18 pt underwent drain placment in left psoad abscess -removed 47m bloody purulent fluid(Cx MSSA). Noted that left psoas abscess has second collection, and right posad collection not aspirated(no window avaialbe. -CT on 1/24 showed decrease in size of left psoas collection with PERC drain cath in place, right-sided psoas abscess not significant change since 1/17. Continued improvement of prostate gland with small areas of ill-defined low attenuation in the prostate apex.  Plan: -Labs today(contacted SNF to send weekly cbc, cmp, esr, crp) -Continue cefazolin x6 weeks EOT 2/28->cefadroxil till atleast next visit. 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 Will target coverage for MSSA as above -Will refer to podiatry per recent amputation   #Spinal stenosis with back pain -Ongoing issue, imaging during hospitalization without signs of infection -[facility]   09/11/22  8:24 AM   I have personally spent 55 minutes involved in face-to-face and non-face-to-face activities for this patient on the day of the visit. Professional time spent includes the following activities: Preparing to see the patient (review of tests), Obtaining and/or reviewing separately obtained history (admission/discharge record), Performing a medically appropriate examination and/or evaluation , Ordering medications/tests/procedures, referring and communicating with other health care professionals, Documenting clinical information in the EMR, Independently interpreting results (not separately  reported), Communicating results to the patient/family/caregiver, Counseling and educating the patient/family/caregiver and Care coordination (not separately reported).

## 2022-09-11 NOTE — Progress Notes (Signed)
Labs drawn via PICC line per Dr. Candiss Norse. Line flushed with 10 mL normal saline and clamped. Patient tolerated procedure well.    Beryle Flock, RN

## 2022-09-12 LAB — COMPLETE METABOLIC PANEL WITH GFR
AG Ratio: 0.9 (calc) — ABNORMAL LOW (ref 1.0–2.5)
ALT: 8 U/L — ABNORMAL LOW (ref 9–46)
AST: 13 U/L (ref 10–35)
Albumin: 3.2 g/dL — ABNORMAL LOW (ref 3.6–5.1)
Alkaline phosphatase (APISO): 143 U/L (ref 35–144)
BUN/Creatinine Ratio: 20 (calc) (ref 6–22)
BUN: 11 mg/dL (ref 7–25)
CO2: 25 mmol/L (ref 20–32)
Calcium: 8.5 mg/dL — ABNORMAL LOW (ref 8.6–10.3)
Chloride: 103 mmol/L (ref 98–110)
Creat: 0.56 mg/dL — ABNORMAL LOW (ref 0.70–1.30)
Globulin: 3.7 g/dL (calc) (ref 1.9–3.7)
Glucose, Bld: 164 mg/dL — ABNORMAL HIGH (ref 65–99)
Potassium: 4.1 mmol/L (ref 3.5–5.3)
Sodium: 137 mmol/L (ref 135–146)
Total Bilirubin: 0.5 mg/dL (ref 0.2–1.2)
Total Protein: 6.9 g/dL (ref 6.1–8.1)
eGFR: 114 mL/min/{1.73_m2} (ref >=60)

## 2022-09-12 LAB — CBC WITH DIFFERENTIAL/PLATELET
Absolute Monocytes: 1061 cells/uL — ABNORMAL HIGH (ref 200–950)
Basophils Absolute: 95 cells/uL (ref 0–200)
Basophils Relative: 0.7 %
Eosinophils Absolute: 204 cells/uL (ref 15–500)
Eosinophils Relative: 1.5 %
HCT: 34.7 % — ABNORMAL LOW (ref 38.5–50.0)
Hemoglobin: 11.1 g/dL — ABNORMAL LOW (ref 13.2–17.1)
Lymphs Abs: 3346 cells/uL (ref 850–3900)
MCH: 26 pg — ABNORMAL LOW (ref 27.0–33.0)
MCHC: 32 g/dL (ref 32.0–36.0)
MCV: 81.3 fL (ref 80.0–100.0)
MPV: 9.2 fL (ref 7.5–12.5)
Monocytes Relative: 7.8 %
Neutro Abs: 8894 cells/uL — ABNORMAL HIGH (ref 1500–7800)
Neutrophils Relative %: 65.4 %
Platelets: 457 10*3/uL — ABNORMAL HIGH (ref 140–400)
RBC: 4.27 10*6/uL (ref 4.20–5.80)
RDW: 14.8 % (ref 11.0–15.0)
Total Lymphocyte: 24.6 %
WBC: 13.6 10*3/uL — ABNORMAL HIGH (ref 3.8–10.8)

## 2022-09-12 LAB — SEDIMENTATION RATE: Sed Rate: 45 mm/h — ABNORMAL HIGH (ref 0–20)

## 2022-09-12 LAB — C-REACTIVE PROTEIN: CRP: 26.2 mg/L — ABNORMAL HIGH (ref <8.0)

## 2022-09-18 NOTE — Progress Notes (Signed)
They are faxing [place] labs. Received 2/9 labs today and placed in Dr. Keturah Barre box. (2/9 SCr 0.61, WBC 10.5).

## 2022-09-25 ENCOUNTER — Ambulatory Visit (HOSPITAL_COMMUNITY): Payer: Commercial Managed Care - PPO | Attending: Internal Medicine

## 2022-10-14 ENCOUNTER — Ambulatory Visit: Payer: Commercial Managed Care - PPO | Admitting: Internal Medicine

## 2022-10-18 ENCOUNTER — Ambulatory Visit (INDEPENDENT_AMBULATORY_CARE_PROVIDER_SITE_OTHER): Payer: Commercial Managed Care - PPO | Admitting: Podiatry

## 2022-10-18 DIAGNOSIS — Z91199 Patient's noncompliance with other medical treatment and regimen due to unspecified reason: Secondary | ICD-10-CM

## 2022-10-18 NOTE — Progress Notes (Signed)
   Complete physical exam  Patient: William [name]   DOB: 05/25/1997   62 y.o. Male  MRN: [id number]  Subjective:    No chief complaint on file.   William [name] is a 62 y.o. male who presents today for a complete physical exam. She reports consuming a {diet types:17450} diet. {types:19826} She generally feels {DESC; WELL/FAIRLY WELL/POORLY:18703}. She reports sleeping {DESC; WELL/FAIRLY WELL/POORLY:18703}. She {does/does not:200015} have additional problems to discuss today.    Most recent fall risk assessment:    01/30/2022   10:42 AM  Fall Risk   Falls in the past year? 0  Number falls in past yr: 0  Injury with Fall? 0  Risk for fall due to : No Fall Risks  Follow up Falls evaluation completed     Most recent depression screenings:    01/30/2022   10:42 AM 12/21/2020   10:46 AM  PHQ 2/9 Scores  PHQ - 2 Score 0 0  PHQ- 9 Score 5     {VISON DENTAL STD PSA (Optional):27386}  {History (Optional):23778}  Patient Care Team: Jessup, Joy, NP as PCP - General (Nurse Practitioner)   Outpatient Medications Prior to Visit  Medication Sig   fluticasone (FLONASE) 50 MCG/ACT nasal spray Place 2 sprays into both nostrils in the morning and at bedtime. After 7 days, reduce to once daily.   norgestimate-ethinyl estradiol (SPRINTEC 28) 0.25-35 MG-MCG tablet Take 1 tablet by mouth daily.   Nystatin POWD Apply liberally to affected area 2 times per day   spironolactone (ALDACTONE) 100 MG tablet Take 1 tablet (100 mg total) by mouth daily.   No facility-administered medications prior to visit.    ROS        Objective:     There were no vitals taken for this visit. {Vitals History (Optional):23777}  Physical Exam   No results found for any visits on 03/07/22. {Show previous labs (optional):23779}    Assessment & Plan:    Routine Health Maintenance and Physical Exam  Immunization History  Administered Date(s) Administered   DTaP 08/08/1999, 10/04/1999,  12/13/1999, 08/28/2000, 03/13/2004   Hepatitis A 01/08/2008, 01/13/2009   Hepatitis B 05/26/1999, 07/03/1999, 12/13/1999   HiB (PRP-OMP) 08/08/1999, 10/04/1999, 12/13/1999, 08/28/2000   IPV 08/08/1999, 10/04/1999, 06/02/2000, 03/13/2004   Influenza,inj,Quad PF,6+ Mos 04/15/2014   Influenza-Unspecified 07/15/2012   MMR 06/02/2001, 03/13/2004   Meningococcal Polysaccharide 01/13/2012   Pneumococcal Conjugate-13 08/28/2000   Pneumococcal-Unspecified 12/13/1999, 02/26/2000   Tdap 01/13/2012   Varicella 06/02/2000, 01/08/2008    Health Maintenance  Topic Date Due   HIV Screening  Never done   Hepatitis C Screening  Never done   INFLUENZA VACCINE  03/05/2022   PAP-Cervical Cytology Screening  03/07/2022 (Originally 05/24/2020)   PAP SMEAR-Modifier  03/07/2022 (Originally 05/24/2020)   TETANUS/TDAP  03/07/2022 (Originally 01/12/2022)   HPV VACCINES  Discontinued   COVID-19 Vaccine  Discontinued    Discussed health benefits of physical activity, and encouraged her to engage in regular exercise appropriate for her age and condition.  Problem List Items Addressed This Visit   None Visit Diagnoses     Annual physical exam    -  Primary   Cervical cancer screening       Need for Tdap vaccination          No follow-ups on file.     Joy Jessup, NP   

## 2022-10-21 ENCOUNTER — Ambulatory Visit: Payer: Commercial Managed Care - PPO | Admitting: Internal Medicine

## 2022-10-21 NOTE — Progress Notes (Deleted)
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   Osteomyelitis of left foot ([place]) 08/12/2022   Sepsis with organ dysfunction ([place]) 08/12/2022   Severe obstructive sleep apnea 08/12/2022   Acute prostatitis 08/09/2022   Urinary tract infection without hematuria 08/09/2022   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 diabetes mellitus, with fat layer exposed ([place]) 09/07/2021   Diabetes mellitus with hyperglycemia, without long-term current use of insulin ([place]) 05/23/2016   Hypercholesteremia 05/23/2016   Morbid obesity ([place]) 05/23/2016    Patient's Medications  New Prescriptions   No medications on file  Previous Medications   ALBUTEROL (ACCUNEB) 1.25 MG/3ML NEBULIZER SOLUTION    Take 1 ampule by nebulization every 6 (six) hours as needed for shortness of breath or wheezing.   ATORVASTATIN (LIPITOR) 20 MG TABLET    Take 1 tablet (20 mg total) by mouth daily.   CEFADROXIL (DURICEF) 500 MG CAPSULE    Take 2 capsules (1,000 mg total) by mouth 2 (two) times daily.   INSULIN ASPART (NOVOLOG) 100 UNIT/ML INJECTION    Inject 0-15 Units into the skin 3 (three) times daily with meals. CBG 70 - 120: 0 units CBG 121 - 150: 2 units CBG 151 - 200: 3 units CBG 201 - 250: 5 units CBG 251 - 300: 8 units CBG 301 - 350: 11 units CBG 351 - 400: 15 units CBG > 400: call MD and obtain STAT lab verification   INSULIN GLARGINE-YFGN (SEMGLEE) 100 UNIT/ML INJECTION    Inject  0.2 mLs (20 Units total) into the skin at bedtime.   POLYETHYLENE GLYCOL (MIRALAX / GLYCOLAX) 17 G PACKET    Take 17 g by mouth 2 (two) times daily as needed for mild constipation.   SENNA-DOCUSATE (SENOKOT-S) 8.6-50 MG TABLET    Take 2 tablets by mouth 2 (two) times daily as needed for mild constipation.   TAMSULOSIN (FLOMAX) 0.4 MG CAPS CAPSULE    Take 1 capsule (0.4 mg total) by mouth daily.  Modified Medications   No medications on file  Discontinued Medications   No medications on file    Subjective: 66 YM with past medical history of hypothyroidism, diabetes, morbid obesity presents for hospital follow-up for disseminated MSSA infection.  Patient was admitted from 1/12 - 2/2 as a transfer from [facility] ER for treatment secondary MSSA infection with left great toe osteomyelitis complicated by DKA.  Transferred to [facility] on 1/12 for further management of prostatic abscess.  Blood cultures at [facility] ER on 1/8 grew 2/2 MSSA, blood cultures 1/19 no growth.  CT at [facility] ER showed ypodensities in prostate gland measuring 3.5X 2.2 cm, in the right Hemi gland and 4X 3 cm in the left Hemi gland suspicious for prostatic abscess. Right psoas abscess measuring 3.2X 2.7 cm likely reflecting developing abscess, small pelvic free fluid. Right middle lobe groundglass opacity, partially imaged..  Seen by urology at our facility status post TURP/prostate abscess (1/14).  CT  on 1/17 showed b/l iliopsoas fluid collection, right side larger. On 1/18 pt underwent drain placment in left psoad abscess -removed 33ml bloody purulent fluid(Cx MSSA). Noted that left psoas abscess has second collection, and right posad collection not aspirated(no window avaialbe.  Recommended follow-up CT.  I gave him 1/15.  TTE at [facility] showed mild thickened aortic leaflets, TEE no vegetation noted.CT on 1/24 showed decrease in size of left psoas collection with PERC drain cath in place, right-sided psoas abscess not significant  change since 1/17.  Continued improvement of prostate gland with small areas of ill-defined low attenuation in the prostate apex.  PICC line placed on 1/19 and patient discharged cefazolin x 6 weeks EOT 2/28 from negative cultures on 1/18.  Plan to send her prescription to cefadroxil p.o. twice daily for psoas abscess to repeat imaging available. 09/11/22: Patient reports that he does not have fevers or chills.  He has some back pain which is ongoing.   Review of Systems: ROS  Past Medical History:  Diagnosis Date   Diabetes mellitus, type II ([place])    Hyperlipidemia    Hypertension     Social History   Tobacco Use   Smoking status: Never   Smokeless tobacco: Never  Vaping Use   Vaping Use: Never used  Substance Use Topics   Alcohol use: No   Drug use: No    Family History  Problem Relation Age of Onset   Thyroid disease Mother    Hypertension Mother    Diabetes Father    Hypertension Father    Hypertension Brother     Allergies  Allergen Reactions   Diltiazem Hcl Other (See Comments)    Causes accelerated heart rate    Health Maintenance  Topic Date Due   FOOT EXAM  Never done   OPHTHALMOLOGY EXAM  Never done   HIV Screening  Never done   Diabetic kidney evaluation - Urine ACR  Never done   Hepatitis C Screening  Never done   COLONOSCOPY (Pts 45-66yrs Insurance coverage will need to be confirmed)  Never done   Zoster Vaccines- Shingrix (1 of 2) Never done   INFLUENZA VACCINE  03/05/2022   COVID-19 Vaccine (2 - 2023-24 season) 04/05/2022   HEMOGLOBIN A1C  02/15/2023   Diabetic kidney evaluation - eGFR measurement  09/12/2023   DTaP/Tdap/Td (2 - Td or Tdap) 07/07/2031   HPV VACCINES  Aged Out    Objective:  There were no vitals filed for this visit. There is no height or weight on file to calculate BMI.  Physical Exam  Lab Results Lab Results  Component Value Date   WBC 13.6 (H) 09/11/2022   HGB 11.1 (L) 09/11/2022   HCT 34.7 (L) 09/11/2022   MCV  81.3 09/11/2022   PLT 457 (H) 09/11/2022    Lab Results  Component Value Date   CREATININE 0.56 (L) 09/11/2022   BUN 11 09/11/2022   NA 137 09/11/2022   K 4.1 09/11/2022   CL 103 09/11/2022   CO2 25 09/11/2022    Lab Results  Component Value Date   ALT 8 (L) 09/11/2022   AST 13 09/11/2022   ALKPHOS 234 (H) 08/26/2022   BILITOT 0.5 09/11/2022    Lab Results  Component Value Date   CHOL 131 09/05/2022   HDL 24 (L) 09/05/2022   LDLCALC 85 09/05/2022   TRIG 111 09/05/2022   CHOLHDL 5.5 09/05/2022   No results found for: "LABRPR", "RPRTITER" No results found for: "HIV1RNAQUANT", "HIV1RNAVL", "  CD4TABS"   Problem List Items Addressed This Visit   None  Assessment/Plan  # Disseminated MSSA bacteremia with  b/l psoas abscess, prostatic abscess and lungs - Blood cultures at [facility] ER on 1/8 grew 2/2 MSSA, blood cultures on 1/13 at our facility with no growth.   - TTE at [facility] on 1/12 showed mildly thickened aortic leaflets. , TEE no veg - SP TURP/ptostate abscess unroofing on 1/14,  - CT on 1/17 showed b/l iliopsoas fluid collection, right side larger. On 1/18 pt underwent drain placment in left psoad abscess -removed 37ml bloody purulent fluid(Cx MSSA). Noted that left psoas abscess has second collection, and right posad collection not aspirated(no window avaialbe. -CT on 1/24 showed decrease in size of left psoas collection with PERC drain cath in place, right-sided psoas abscess not significant change since 1/17. Continued improvement of prostate gland with small areas of ill-defined low attenuation in the prostate apex.  Plan: -Labs today(contacted SNF to send weekly cbc, cmp, esr, crp) -Continue cefazolin x6 weeks EOT 2/28->cefadroxil till atleast next visit. 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 Will target coverage for MSSA as above -Will refer to podiatry per recent amputation     #Spinal stenosis with back pain -Ongoing issue, imaging during hospitalization without signs of infection -Refer to  [place] [place] [facility] [place] [facility] 10/21/2022, 8:50 AM

## 2022-10-22 ENCOUNTER — Telehealth: Payer: Self-pay

## 2022-10-22 NOTE — Telephone Encounter (Signed)
Martinique Miller, NP at [facility] called to inform Dr.Singh - patient wasn't discharged from SNF. Patient now has a wound on his sacrum. Patient was experiencing fever, chills, low BP, tachycardic. [place], NP cultured wound - culture was negative, did x-ray - negative for osteomyelitis. Patient was started on IV vancomycin and Zosyn.Patient recently got labs and blood cultures done which will be faxed to our office. Patient is doing much better now.  Patient is scheduled for follow up with Dr.Singh on 11/12/2022.  Martinique Miller, NP call back number is [phone].   [place], CMA

## 2022-10-28 ENCOUNTER — Ambulatory Visit (HOSPITAL_COMMUNITY): Payer: Commercial Managed Care - PPO

## 2022-11-01 ENCOUNTER — Ambulatory Visit (HOSPITAL_COMMUNITY)
Admission: RE | Admit: 2022-11-01 | Discharge: 2022-11-01 | Disposition: A | Discharge status: Home / Self Care | Payer: Commercial Managed Care - PPO | Source: Ambulatory Visit | Attending: Internal Medicine | Admitting: Internal Medicine

## 2022-11-01 DIAGNOSIS — R7881 Bacteremia: Secondary | ICD-10-CM | POA: Insufficient documentation

## 2022-11-01 MED ORDER — IOHEXOL 300 MG/ML  SOLN
100.0000 mL | Freq: Once | INTRAMUSCULAR | Status: AC | PRN
  Administered 2022-11-01: 100 mL via INTRAVENOUS

## 2022-11-07 ENCOUNTER — Ambulatory Visit (HOSPITAL_BASED_OUTPATIENT_CLINIC_OR_DEPARTMENT_OTHER): Payer: Commercial Managed Care - PPO

## 2022-11-12 ENCOUNTER — Ambulatory Visit: Payer: Commercial Managed Care - PPO | Admitting: Internal Medicine

## 2022-11-21 ENCOUNTER — Ambulatory Visit (INDEPENDENT_AMBULATORY_CARE_PROVIDER_SITE_OTHER): Payer: Commercial Managed Care - PPO | Admitting: Internal Medicine

## 2022-11-21 ENCOUNTER — Encounter: Payer: Self-pay | Admitting: Internal Medicine

## 2022-11-21 ENCOUNTER — Other Ambulatory Visit: Payer: Self-pay

## 2022-11-21 VITALS — BP 125/72 | HR 82 | Temp 97.6°F | Wt 367.0 lb

## 2022-11-21 DIAGNOSIS — R7881 Bacteremia: Secondary | ICD-10-CM

## 2022-11-21 DIAGNOSIS — S31000A Unspecified open wound of lower back and pelvis without penetration into retroperitoneum, initial encounter: Secondary | ICD-10-CM

## 2022-11-21 LAB — SEDIMENTATION RATE: Sed Rate: 14 mm/h (ref 0–20)

## 2022-11-21 LAB — CBC WITH DIFFERENTIAL/PLATELET
HCT: 38.7 % (ref 38.5–50.0)
MCH: 25.8 pg — ABNORMAL LOW (ref 27.0–33.0)
RBC: 4.57 10*6/uL (ref 4.20–5.80)
Total Lymphocyte: 29.1 %

## 2022-11-21 NOTE — Progress Notes (Signed)
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/2022   Normocytic anemia 08/17/2022   Type 2 diabetes mellitus 08/17/2022   Possible psoas muscle infection 08/17/2022   AKI (acute kidney injury) 08/12/2022   Paroxysmal atrial fibrillation 08/12/2022   DKA (diabetic ketoacidosis) 08/12/2022   Leucocytosis 08/12/2022   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  y.o. Mwith past medical history of hypothyroidism, diabetes, morbid obesity presents for hospital follow-up for disseminated MSSA infection.  Patient was admitted from 1/12 - 2/2 as a transfer from [facility] ER for treatment secondary MSSA infection with left great toe osteomyelitis complicated by DKA.  Transferred to [facility] on 1/12 for further management of prostatic abscess.  Blood cultures at [facility] ER on 1/8 grew 2/2 MSSA, blood cultures 1/19 no growth.  CT at [facility] ER showed ypodensities in prostate gland measuring 3.5X 2.2 cm, in the right Hemi gland and 4X 3 cm in the left Hemi gland suspicious for prostatic abscess. Right psoas abscess measuring 3.2X 2.7 cm likely reflecting developing  abscess, small pelvic free fluid. Right middle lobe groundglass opacity, partially imaged..  Seen by urology at our facility status post TURP/prostate abscess (1/14).  CT on 1/17 showed b/l iliopsoas fluid collection, right side larger. On 1/18 pt underwent drain placment in left psoad abscess -removed 20ml bloody purulent fluid(Cx MSSA). Noted that left psoas abscess has second collection, and right posad collection not aspirated(no window avaialbe.  Recommended follow-up CT.  I gave him 1/15.  TTE at [facility] showed mild thickened aortic leaflets, TEE no vegetation noted.CT on 1/24 showed decrease in size of left psoas collection with PERC drain cath in place, right-sided psoas abscess not significant change since 1/17.  Continued improvement of prostate gland with small areas of ill-defined low attenuation in the prostate apex.  PICC line placed on 1/19 and patient discharged cefazolin x 6 weeks EOT 2/28 from negative cultures on 1/18.  Plan to send her prescription to cefadroxil p.o. twice daily for psoas abscess to repeat imaging available. 09/11/22: Patient reports that he does not have fevers or chills.  He has some back pain which is ongoing. Today 11/21/22: No showed podiatry on 3/15.  Patient reports that back pain is resolved, he has not seen neurosurgery for that reason.  He reports no issues with his great toe.  Reports that he was briefly treated with Minkin pip-tazo while at the facility for sacral bedsore, x-ray noted to not show osteomyelitis.  He is back on his cefadroxil 1 g twice daily without any missed doses. Review of Systems  All other systems reviewed and are negative.   Past Medical History:  Diagnosis  Date   Diabetes mellitus, type II    Hyperlipidemia    Hypertension     Outpatient Medications Prior to Visit  Medication Sig Dispense Refill   albuterol (ACCUNEB) 1.25 MG/3ML nebulizer solution Take 1 ampule by nebulization every 6 (six) hours as needed for shortness of breath or  wheezing.     atorvastatin (LIPITOR) 20 MG tablet Take 1 tablet (20 mg total) by mouth daily.     cefadroxil (DURICEF) 500 MG capsule Take 2 capsules (1,000 mg total) by mouth 2 (two) times daily. 60 capsule 0   HUMALOG KWIKPEN 100 UNIT/ML KwikPen INJECT INSULIN 3 TIMES DAILY PER SLIDING SCALE AND GLUCOSE-70 TO 120-0 UNITS, 121-150-2 UNITS, 151-200-3 UNITS, 201-250-5 UNITS, 251-300-8 UNITS, 301-350-11 UNITS, 351-400-15 UNITS. CBG>400 CALL MD AND OBTAIN STAT LAB VERIFICATION     insulin aspart (NOVOLOG) 100 UNIT/ML injection Inject 0-15 Units into the skin 3 (three) times daily with meals. CBG 70 - 120: 0 units CBG 121 - 150: 2 units CBG 151 - 200: 3 units CBG 201 - 250: 5 units CBG 251 - 300: 8 units CBG 301 - 350: 11 units CBG 351 - 400: 15 units CBG > 400: call MD and obtain STAT lab verification 10 mL 11   insulin glargine-yfgn (SEMGLEE) 100 UNIT/ML injection Inject 0.2 mLs (20 Units total) into the skin at bedtime. 10 mL 11   potassium chloride (KLOR-CON) 10 MEQ tablet Take 20 mEq by mouth 2 (two) times daily.     senna-docusate (SENOKOT-S) 8.6-50 MG tablet Take 2 tablets by mouth 2 (two) times daily as needed for mild constipation.     sertraline (ZOLOFT) 50 MG tablet Take 50 mg by mouth daily.     tamsulosin (FLOMAX) 0.4 MG CAPS capsule Take 1 capsule (0.4 mg total) by mouth daily. 30 capsule 1   polyethylene glycol (MIRALAX / GLYCOLAX) 17 g packet Take 17 g by mouth 2 (two) times daily as needed for mild constipation. 14 each 0   No facility-administered medications prior to visit.     Allergies  Allergen Reactions   Diltiazem Hcl Other (See Comments)    Causes accelerated heart rate    Social History   Tobacco Use   Smoking status: Never   Smokeless tobacco: Never  Vaping Use   Vaping Use: Never used  Substance Use Topics   Alcohol use: No   Drug use: No    Family History  Problem Relation Age of Onset   Thyroid disease Mother    Hypertension Mother    Diabetes Father     Hypertension Father    Hypertension Brother     Objective:   Vitals:   11/21/22 0829  BP: 125/72  Pulse: 82  Temp: 97.6 F (36.4 C)  TempSrc: Oral  SpO2: 100%  Weight: (!) 367 lb (166.5 kg)   Body mass index is 44.67 kg/m.  Physical Exam Constitutional:      General: He is not in acute distress.    Appearance: He is normal weight. He is not toxic-appearing.  HENT:     Head: Normocephalic and atraumatic.     Right Ear: External ear normal.     Left Ear: External ear normal.     Nose: No congestion or rhinorrhea.     Mouth/Throat:     Mouth: Mucous membranes are moist.     Pharynx: Oropharynx is clear.  Eyes:     Extraocular Movements: Extraocular movements intact.     Conjunctiva/sclera: Conjunctivae normal.  Pupils: Pupils are equal, round, and reactive to light.  Cardiovascular:     Rate and Rhythm: Normal rate and regular rhythm.     Heart sounds: No murmur heard.    No friction rub. No gallop.  Pulmonary:     Effort: Pulmonary effort is normal.     Breath sounds: Normal breath sounds.  Abdominal:     General: Abdomen is flat. Bowel sounds are normal.     Palpations: Abdomen is soft.  Musculoskeletal:        General: No swelling.     Cervical back: Normal range of motion and neck supple.  Skin:    General: Skin is warm and dry.  Neurological:     General: No focal deficit present.     Mental Status: He is oriented to person, place, and time.  Psychiatric:        Mood and Affect: Mood normal.        Lab Results: Lab Results  Component Value Date   WBC 13.6 (H) 09/11/2022   HGB 11.1 (L) 09/11/2022   HCT 34.7 (L) 09/11/2022   MCV 81.3 09/11/2022   PLT 457 (H) 09/11/2022    Lab Results  Component Value Date   CREATININE 0.56 (L) 09/11/2022   BUN 11 09/11/2022   NA 137 09/11/2022   K 4.1 09/11/2022   CL 103 09/11/2022   CO2 25 09/11/2022    Lab Results  Component Value Date   ALT 8 (L) 09/11/2022   AST 13 09/11/2022   ALKPHOS 234 (H)  08/26/2022   BILITOT 0.5 09/11/2022     Assessment & Plan:   # Disseminated MSSA bacteremia with  b/l psoas abscess, prostatic abscess and lungs #New sacral wound - Blood cultures at [facility] on 1/8 grew 2/2 MSSA, blood cultures on 1/13 at our facility with no growth.   - TTE at [facility] on 1/12 showed mildly thickened aortic leaflets. , TEE no veg - SP TURP/ptostate abscess unroofing on 1/14,  - CT on 1/17 showed b/l iliopsoas fluid collection, right side larger. On 1/18 pt underwent drain placment in left psoad abscess -removed 20ml bloody purulent fluid(Cx MSSA). Noted that left psoas abscess has second collection, and right posad collection not aspirated(no window avaialbe.  -CT on 1/31 showed interval decrease in right psoas abscess measuring 2.5X 2.5 cm previously 3.5X 2.6 cm, resolution of left psoas abscess, retracted left lower quadrant PERC drain in subcutaneous tissue. - At 2/8 visit with infectious disease CT was ordered for follow-up. -CT on 3/29 showed interval resolution of psoas muscle abscess, progression of disc desiccation and endplate irregularity at L4-L5 and L5/S1 concern no spondylodiscitis, recommended MRI for follow-up  Follow up: -Completed cefazolin x6 weeks which ended on 2/28, then pt transitioned to cefadroxil gm bid, he was supposed to f/u on 3/11 to f/u on CT and abx duration. Noted at SNF he had a bed sore, X-ray showed no osteo and was intermittently administered vanc+ pip tazo then transitioned back to cefadroxil. Ulcer appears to healing.  Plan: - Stop antibiotics (cefadroxil) - Labs today - Follow-up in 1 month to monitor off of antibiotics.  He had no issues today.     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 Will target coverage for MSSA as above -Refer to podiatry per recent amputation, patient no showed on his appointment 3/50.  On my  exam the wound looks healed.    #Spinal stenosis with back pain -Resolved, patient has completed more than 8 weeks of antibiotics(6 IV+ 7 PO) as such we will hold off on further imaging and neurosurgery appointment    Danelle Earthly, MD [facility]   11/21/22  8:40 AM   I have personally spent 58 minutes involved in face-to-face and non-face-to-face activities for this patient on the day of the visit. Professional time spent includes the following activities: Preparing to see the patient (review of tests), Obtaining and/or reviewing separately obtained history (admission/discharge record), Performing a medically appropriate examination and/or evaluation , Ordering medications/tests/procedures, referring and communicating with other health care professionals, Documenting clinical information in the EMR, Independently interpreting results (not separately reported), Communicating results to the patient/family/caregiver, Counseling and educating the patient/family/caregiver and Care coordination (not separately reported).

## 2022-11-22 LAB — COMPLETE METABOLIC PANEL WITH GFR
AG Ratio: 1.4 (calc) (ref 1.0–2.5)
ALT: 12 U/L (ref 9–46)
AST: 12 U/L (ref 10–35)
Albumin: 4.2 g/dL (ref 3.6–5.1)
Alkaline phosphatase (APISO): 103 U/L (ref 35–144)
BUN: 21 mg/dL (ref 7–25)
CO2: 28 mmol/L (ref 20–32)
Calcium: 8.9 mg/dL (ref 8.6–10.3)
Chloride: 105 mmol/L (ref 98–110)
Creat: 1.04 mg/dL (ref 0.70–1.35)
Globulin: 3.1 g/dL (calc) (ref 1.9–3.7)
Glucose, Bld: 145 mg/dL — ABNORMAL HIGH (ref 65–99)
Potassium: 3.8 mmol/L (ref 3.5–5.3)
Sodium: 140 mmol/L (ref 135–146)
Total Bilirubin: 0.4 mg/dL (ref 0.2–1.2)
Total Protein: 7.3 g/dL (ref 6.1–8.1)
eGFR: 82 mL/min/{1.73_m2} (ref >=60)

## 2022-11-22 LAB — CBC WITH DIFFERENTIAL/PLATELET
Absolute Monocytes: 461 cells/uL (ref 200–950)
Basophils Absolute: 43 cells/uL (ref 0–200)
Basophils Relative: 0.6 %
Eosinophils Absolute: 151 cells/uL (ref 15–500)
Eosinophils Relative: 2.1 %
Hemoglobin: 11.8 g/dL — ABNORMAL LOW (ref 13.2–17.1)
Lymphs Abs: 2095 cells/uL (ref 850–3900)
MCHC: 30.5 g/dL — ABNORMAL LOW (ref 32.0–36.0)
MCV: 84.7 fL (ref 80.0–100.0)
MPV: 10.2 fL (ref 7.5–12.5)
Monocytes Relative: 6.4 %
Neutro Abs: 4450 cells/uL (ref 1500–7800)
Neutrophils Relative %: 61.8 %
Platelets: 221 10*3/uL (ref 140–400)
RDW: 16.8 % — ABNORMAL HIGH (ref 11.0–15.0)
WBC: 7.2 10*3/uL (ref 3.8–10.8)

## 2022-11-22 LAB — C-REACTIVE PROTEIN: CRP: <3 mg/L (ref <8.0)

## 2022-12-19 ENCOUNTER — Ambulatory Visit: Payer: Commercial Managed Care - PPO | Admitting: Internal Medicine

## 2023-01-09 ENCOUNTER — Ambulatory Visit: Payer: Commercial Managed Care - PPO | Admitting: Internal Medicine

## 2023-01-16 ENCOUNTER — Encounter: Payer: Self-pay | Admitting: Internal Medicine

## 2023-01-16 ENCOUNTER — Other Ambulatory Visit: Payer: Self-pay

## 2023-01-16 ENCOUNTER — Ambulatory Visit (INDEPENDENT_AMBULATORY_CARE_PROVIDER_SITE_OTHER): Payer: Commercial Managed Care - PPO | Admitting: Internal Medicine

## 2023-01-16 VITALS — BP 161/88 | HR 80 | Temp 97.2°F | Resp 16 | Wt 376.0 lb

## 2023-01-16 DIAGNOSIS — M869 Osteomyelitis, unspecified: Secondary | ICD-10-CM | POA: Diagnosis not present

## 2023-01-16 NOTE — Progress Notes (Signed)
Patient: William [name]  DOB: September 18, 1960 MRN: [id number] PCP: Pcp, No      Patient Active Problem List   Diagnosis Date Noted   Malnutrition of moderate degree 08/21/2022   Hyponatremia 08/20/2022   Hyperthyroidism 08/19/2022   MSSA bacteremia 08/18/2022   Hypokalemia 08/18/2022   Prostatic abscess 08/17/2022   Normocytic anemia 08/17/2022   Type 2 diabetes mellitus (HCC) 08/17/2022   Possible psoas muscle infection 08/17/2022   AKI (acute kidney injury) (HCC) 08/12/2022   Paroxysmal atrial fibrillation (HCC) 08/12/2022   DKA (diabetic ketoacidosis) (HCC) 08/12/2022   Leucocytosis 08/12/2022   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  M with past medical history of hypothyroidism, diabetes, morbid obesity presents for hospital follow-up for disseminated MSSA infection.  Patient was admitted from 1/12 - 2/2 as a transfer from [facility] ER for treatment secondary MSSA infection with left great toe osteomyelitis complicated by DKA.  Transferred to [facility] on 1/12 for further management of prostatic abscess.  Blood cultures at [facility] ER on 1/8 grew 2/2 MSSA, blood cultures 1/19 no growth.  CT at [facility] ER showed ypodensities in prostate gland measuring 3.5X 2.2 cm, in the right Hemi gland and 4X 3 cm in the left Hemi gland suspicious for prostatic abscess. Right psoas abscess measuring 3.2X 2.7 cm likely reflecting developing abscess,  small pelvic free fluid. Right middle lobe groundglass opacity, partially imaged..  Seen by urology at our facility status post TURP/prostate abscess (1/14).  CT on 1/17 showed b/l iliopsoas fluid collection, right side larger. On 1/18 pt underwent drain placment in left psoad abscess -removed 20ml bloody purulent fluid(Cx MSSA). Noted that left psoas abscess has second collection, and right posad collection not aspirated(no window avaialbe.  Recommended follow-up CT.  I gave him 1/15.  TTE at [facility] showed mild thickened aortic leaflets, TEE no vegetation noted.CT on 1/24 showed decrease in size of left psoas collection with PERC drain cath in place, right-sided psoas abscess not significant change since 1/17.  Continued improvement of prostate gland with small areas of ill-defined low attenuation in the prostate apex.  PICC line placed on 1/19 and patient discharged cefazolin x 6 weeks EOT 2/28 from negative cultures on 1/18.  Plan to send her prescription to cefadroxil p.o. twice daily for psoas abscess to repeat imaging available. 09/11/22: Patient reports that he does not have fevers or chills.  He has some back pain which is ongoing. 11/21/22: No showed podiatry on 3/15.  Patient reports that back pain is resolved, he has not seen neurosurgery for that reason.  He reports no issues with his great toe.  Reports that he was briefly treated with Minkin pip-tazo while at the facility for sacral bedsore, x-ray noted to not show osteomyelitis.  He is back on his cefadroxil 1 g twice daily without any missed doses.  01/16/23: Doing well. Off of abx. No new concerns.  Review of Systems  All other systems reviewed and are  negative.   Past Medical History:  Diagnosis Date   Diabetes mellitus, type II (HCC)    Hyperlipidemia    Hypertension     Outpatient Medications Prior to Visit  Medication Sig Dispense Refill   albuterol (ACCUNEB) 1.25 MG/3ML nebulizer solution Take 1 ampule by nebulization every 6 (six)  hours as needed for shortness of breath or wheezing.     atorvastatin (LIPITOR) 20 MG tablet Take 1 tablet (20 mg total) by mouth daily.     cefadroxil (DURICEF) 500 MG capsule Take 2 capsules (1,000 mg total) by mouth 2 (two) times daily. 60 capsule 0   HUMALOG KWIKPEN 100 UNIT/ML KwikPen INJECT INSULIN 3 TIMES DAILY PER SLIDING SCALE AND GLUCOSE-70 TO 120-0 UNITS, 121-150-2 UNITS, 151-200-3 UNITS, 201-250-5 UNITS, 251-300-8 UNITS, 301-350-11 UNITS, 351-400-15 UNITS. CBG>400 CALL MD AND OBTAIN STAT LAB VERIFICATION     insulin aspart (NOVOLOG) 100 UNIT/ML injection Inject 0-15 Units into the skin 3 (three) times daily with meals. CBG 70 - 120: 0 units CBG 121 - 150: 2 units CBG 151 - 200: 3 units CBG 201 - 250: 5 units CBG 251 - 300: 8 units CBG 301 - 350: 11 units CBG 351 - 400: 15 units CBG > 400: call MD and obtain STAT lab verification 10 mL 11   insulin glargine-yfgn (SEMGLEE) 100 UNIT/ML injection Inject 0.2 mLs (20 Units total) into the skin at bedtime. 10 mL 11   polyethylene glycol (MIRALAX / GLYCOLAX) 17 g packet Take 17 g by mouth 2 (two) times daily as needed for mild constipation. 14 each 0   potassium chloride (KLOR-CON) 10 MEQ tablet Take 20 mEq by mouth 2 (two) times daily.     senna-docusate (SENOKOT-S) 8.6-50 MG tablet Take 2 tablets by mouth 2 (two) times daily as needed for mild constipation.     sertraline (ZOLOFT) 50 MG tablet Take 50 mg by mouth daily.     tamsulosin (FLOMAX) 0.4 MG CAPS capsule Take 1 capsule (0.4 mg total) by mouth daily. 30 capsule 1   No facility-administered medications prior to visit.     Allergies  Allergen Reactions   Diltiazem Hcl Other (See Comments)    Causes accelerated heart rate    Social History   Tobacco Use   Smoking status: Never   Smokeless tobacco: Never  Vaping Use   Vaping Use: Never used  Substance Use Topics   Alcohol use: No   Drug use: No    Family History  Problem Relation Age of Onset   Thyroid disease Mother     Hypertension Mother    Diabetes Father    Hypertension Father    Hypertension Brother     Objective:  There were no vitals filed for this visit. There is no height or weight on file to calculate BMI.  Physical Exam Constitutional:      General: He is not in acute distress.    Appearance: He is normal weight. He is not toxic-appearing.  HENT:     Head: Normocephalic and atraumatic.     Right Ear: External ear normal.     Left Ear: External ear normal.     Nose: No congestion or rhinorrhea.     Mouth/Throat:     Mouth: Mucous membranes are moist.     Pharynx: Oropharynx is clear.  Eyes:     Extraocular Movements: Extraocular movements intact.     Conjunctiva/sclera: Conjunctivae normal.     Pupils: Pupils are equal, round, and reactive to  light.  Cardiovascular:     Rate and Rhythm: Normal rate and regular rhythm.     Heart sounds: No murmur heard.    No friction rub. No gallop.  Pulmonary:     Effort: Pulmonary effort is normal.     Breath sounds: Normal breath sounds.  Abdominal:     General: Abdomen is flat. Bowel sounds are normal.     Palpations: Abdomen is soft.  Musculoskeletal:        General: No swelling. Normal range of motion.     Cervical back: Normal range of motion and neck supple.  Skin:    General: Skin is warm and dry.  Neurological:     General: No focal deficit present.     Mental Status: He is oriented to person, place, and time.  Psychiatric:        Mood and Affect: Mood normal.        Lab Results: Lab Results  Component Value Date   WBC 7.2 11/21/2022   HGB 11.8 (L) 11/21/2022   HCT 38.7 11/21/2022   MCV 84.7 11/21/2022   PLT 221 11/21/2022    Lab Results  Component Value Date   CREATININE 1.04 11/21/2022   BUN 21 11/21/2022   NA 140 11/21/2022   K 3.8 11/21/2022   CL 105 11/21/2022   CO2 28 11/21/2022    Lab Results  Component Value Date   ALT 12 11/21/2022   AST 12 11/21/2022   ALKPHOS 234 (H) 08/26/2022   BILITOT 0.4  11/21/2022     Assessment & Plan:   # Disseminated MSSA bacteremia with  b/l psoas abscess, prostatic abscess and lungs #New sacral wound - Blood cultures at [facility] ER on 1/8 grew 2/2 MSSA, blood cultures on 1/13 at our facility with no growth.   - TTE at [facility] on 1/12 showed mildly thickened aortic leaflets. , TEE no veg - SP TURP/ptostate abscess unroofing on 1/14,  - CT on 1/17 showed b/l iliopsoas fluid collection, right side larger. On 1/18 pt underwent drain placment in left psoad abscess -removed 20ml bloody purulent fluid(Cx MSSA). Noted that left psoas abscess has second collection, and right posad collection not aspirated(no window avaialbe.  -CT on 1/31 showed interval decrease in right psoas abscess measuring 2.5X 2.5 cm previously 3.5X 2.6 cm, resolution of left psoas abscess, retracted left lower quadrant PERC drain in subcutaneous tissue. - At 2/8 visit with infectious disease CT was ordered for follow-up. -CT on 3/29 showed interval resolution of psoas muscle abscess, progression of disc desiccation and endplate irregularity at L4-L5 and L5/S1 concern no spondylodiscitis, recommended MRI for follow-up -Completed cefazolin x6 weeks which ended on 2/28, then pt transitioned to cefadroxil gm bid, he was supposed to f/u on 3/11 to f/u on CT and abx duration. Noted at SNF he had a bed sore, X-ray showed no osteo and was intermittently administered vanc+ pip tazo then transitioned back to cefadroxil. Ulcer appears to healing.  - Stop antibiotics (cefadroxil) on4/18 Plan: - Labs today. 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 Will target coverage for MSSA as above -Referred to podiatry per recent amputation, patient no showed on his appointment 3/50.  PT has not seen podiatry, but wound appears healed.      #Spinal  stenosis with back pain -Resolved, patient has completed  more than 8 weeks of antibiotics(6 IV+ 7 PO) as such we will hold off on further imaging and neurosurgery appointment  Danelle Earthly, MD [facility]   01/16/23  8:54 AM   I have personally spent 35 minutes involved in face-to-face and non-face-to-face activities for this patient on the day of the visit. Professional time spent includes the following activities: Preparing to see the patient (review of tests), Obtaining and/or reviewing separately obtained history (admission/discharge record), Performing a medically appropriate examination and/or evaluation , Ordering medications/tests/procedures, referring and communicating with other health care professionals, Documenting clinical information in the EMR, Independently interpreting results (not separately reported), Communicating results to the patient/family/caregiver, Counseling and educating the patient/family/caregiver and Care coordination (not separately reported).

## 2023-01-21 NOTE — Addendum Note (Signed)
Addended by: Harley Alto on: 01/21/2023 03:56 PM   Modules accepted: Orders

## 2023-01-24 ENCOUNTER — Other Ambulatory Visit: Payer: Commercial Managed Care - PPO

## 2023-01-27 ENCOUNTER — Telehealth: Payer: Self-pay | Admitting: Urology

## 2023-01-27 ENCOUNTER — Other Ambulatory Visit: Payer: Self-pay | Admitting: Urology

## 2023-01-27 NOTE — Telephone Encounter (Signed)
LVM for patient to call back to sch follow up appt Per Dr Pete Glatter

## 2023-02-24 ENCOUNTER — Ambulatory Visit: Payer: Commercial Managed Care - PPO | Admitting: Urology

## 2023-02-24 NOTE — Progress Notes (Deleted)
Assessment: 1. 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 Prostate abscess     Plan: Continue tamsulosin 0.4 mg daily. PSA today  Chief Complaint:  No chief complaint on file.   History of Present Illness:  William [name] is a 62 y.o. male who is seen for further evaluation of prostatitis with prostatic abscess.  He was seen by urgent care on 07/31/2022 with dysuria difficulty voiding, and left sided back/flank pain.  No fever or chills.  Urine culture at that time grew >100 K Staphylococcus, sensitive to Cipro.  He was started on ciprofloxacin x 14 days, Flomax, and Toradol.  He presented to the emergency room on 08/06/2022 with continued left low back pain.  Urinalysis was unremarkable.  White blood cell count elevated at 17.2 K.  CT imaging showed no renal or ureteral calculi, no obstruction, enlarged prostate which appeared hypodense. He continued on the Cipro, Flomax, and Toradol with some improvement in his symptoms.   He continued to have pain in the left back and flank area, pain in the rectal area, and lower urinary tract symptoms including frequency, urgency, decreased stream, and sensation of incomplete emptying.  No gross hematuria. IPSS = 35. DRE demonstrated prostate tenderness. He was continued on ciprofloxacin as well as tamsulosin.  He was started on meloxicam and as needed pain medication. He presented to an outside hospital on 08/12/2022 with increased symptoms, DKA and A-fib.  He was found to have osteomyelitis of his left great toe and subsequently underwent an amputation.  Further evaluation with a CT scan demonstrated a possible prostatic abscess and a psoas abscess on the left.  He subsequently underwent transurethral resection of the prostate and unroofing of the prostate abscess by Dr. [name] on 08/18/2022. 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  No recent PSA results. CT imaging from  11/01/2022 showed interval resolution of the previously noted abscess in the psoas muscles, no hydronephrosis or nephrolithiasis.  Portions of the above documentation were copied from a prior visit for review purposes only.  Past Medical History:  Past Medical History:  Diagnosis Date   Diabetes mellitus, type II (HCC)    Hyperlipidemia    Hypertension     Past Surgical History:  Past Surgical History:  Procedure Laterality Date   LEG SURGERY Right 2021   TEE WITHOUT CARDIOVERSION N/A 08/27/2022   Procedure: TRANSESOPHAGEAL ECHOCARDIOGRAM (TEE);  Surgeon: Thurmon Fair, MD;  Location: [facility] ENDOSCOPY;  Service: Cardiovascular;  Laterality: N/A;   TRANSURETHRAL RESECTION OF PROSTATE N/A 08/18/2022   Procedure: PROSTATE ABSCESS UNROOFING, TRANSURETHRAL RESECTION OF THE PROSTATE (TURP);  Surgeon: Crist Fat, MD;  Location: [facility] ORS;  Service: Urology;  Laterality: N/A;    Allergies:  Allergies  Allergen Reactions   Diltiazem Hcl Other (See Comments)    Causes accelerated heart rate    Family History:  Family History  Problem Relation Age of Onset   Thyroid disease Mother    Hypertension Mother    Diabetes Father    Hypertension Father    Hypertension Brother     Social History:  Social History   Tobacco Use   Smoking status: Never   Smokeless tobacco: Never  Vaping Use   Vaping status: Never Used  Substance Use Topics   Alcohol use: No   Drug use: No    ROS: Constitutional:  Negative for fever, chills, weight loss CV: Negative for chest pain, previous MI, hypertension  Respiratory:  Negative for shortness of breath, wheezing, sleep apnea, frequent cough GI:  Negative for nausea, vomiting, bloody stool, GERD   Physical exam: There were no vitals taken for this visit. GENERAL APPEARANCE:  Well appearing, well developed, well nourished, NAD HEENT:  Atraumatic, normocephalic, oropharynx clear NECK:  Supple without lymphadenopathy or thyromegaly ABDOMEN:   Soft, non-tender, no masses EXTREMITIES:  Moves all extremities well, without clubbing, cyanosis, or edema NEUROLOGIC:  Alert and oriented x 3, normal gait, CN II-XII grossly intact MENTAL STATUS:  appropriate BACK:  Non-tender to palpation, No CVAT SKIN:  Warm, dry, and intact   Results: U/A:
